# Patient Record
Sex: Male | Born: 1950 | Race: Black or African American | Hispanic: No | Smoking: Former smoker
Health system: Southern US, Community
[De-identification: ages and names within clinical notes are randomized; demographics above are authoritative.]

## PROBLEM LIST (undated history)

## (undated) DIAGNOSIS — H548 Legal blindness, as defined in USA: Secondary | ICD-10-CM

## (undated) DIAGNOSIS — I10 Essential (primary) hypertension: Secondary | ICD-10-CM

## (undated) DIAGNOSIS — C801 Malignant (primary) neoplasm, unspecified: Secondary | ICD-10-CM

## (undated) DIAGNOSIS — E11319 Type 2 diabetes mellitus with unspecified diabetic retinopathy without macular edema: Secondary | ICD-10-CM

## (undated) DIAGNOSIS — E785 Hyperlipidemia, unspecified: Secondary | ICD-10-CM

## (undated) DIAGNOSIS — I509 Heart failure, unspecified: Secondary | ICD-10-CM

## (undated) DIAGNOSIS — J9383 Other pneumothorax: Secondary | ICD-10-CM

## (undated) DIAGNOSIS — D649 Anemia, unspecified: Secondary | ICD-10-CM

## (undated) DIAGNOSIS — N400 Enlarged prostate without lower urinary tract symptoms: Secondary | ICD-10-CM

## (undated) DIAGNOSIS — E119 Type 2 diabetes mellitus without complications: Secondary | ICD-10-CM

## (undated) DIAGNOSIS — N289 Disorder of kidney and ureter, unspecified: Secondary | ICD-10-CM

## (undated) HISTORY — PX: OTHER SURGICAL HISTORY: SHX169

## (undated) HISTORY — PX: CATARACT EXTRACTION: SUR2

## (undated) HISTORY — DX: Type 2 diabetes mellitus with unspecified diabetic retinopathy without macular edema: E11.319

## (undated) HISTORY — DX: Heart failure, unspecified: I50.9

## (undated) HISTORY — PX: EYE SURGERY: SHX253

---

## 2013-03-12 DIAGNOSIS — I10 Essential (primary) hypertension: Secondary | ICD-10-CM | POA: Insufficient documentation

## 2013-03-12 DIAGNOSIS — E119 Type 2 diabetes mellitus without complications: Secondary | ICD-10-CM | POA: Insufficient documentation

## 2013-03-19 DIAGNOSIS — H548 Legal blindness, as defined in USA: Secondary | ICD-10-CM | POA: Insufficient documentation

## 2013-03-19 DIAGNOSIS — H3320 Serous retinal detachment, unspecified eye: Secondary | ICD-10-CM | POA: Insufficient documentation

## 2013-08-30 DIAGNOSIS — N289 Disorder of kidney and ureter, unspecified: Secondary | ICD-10-CM | POA: Insufficient documentation

## 2013-11-08 DIAGNOSIS — C801 Malignant (primary) neoplasm, unspecified: Secondary | ICD-10-CM

## 2013-11-08 HISTORY — DX: Malignant (primary) neoplasm, unspecified: C80.1

## 2014-08-08 DIAGNOSIS — C189 Malignant neoplasm of colon, unspecified: Secondary | ICD-10-CM | POA: Insufficient documentation

## 2014-08-08 DIAGNOSIS — C187 Malignant neoplasm of sigmoid colon: Secondary | ICD-10-CM | POA: Insufficient documentation

## 2014-08-16 DIAGNOSIS — E1121 Type 2 diabetes mellitus with diabetic nephropathy: Secondary | ICD-10-CM | POA: Insufficient documentation

## 2015-05-05 DIAGNOSIS — E611 Iron deficiency: Secondary | ICD-10-CM | POA: Insufficient documentation

## 2015-05-07 ENCOUNTER — Encounter (HOSPITAL_COMMUNITY): Payer: Self-pay | Admitting: Emergency Medicine

## 2015-05-07 ENCOUNTER — Inpatient Hospital Stay (HOSPITAL_BASED_OUTPATIENT_CLINIC_OR_DEPARTMENT_OTHER): Payer: Medicare HMO

## 2015-05-07 ENCOUNTER — Inpatient Hospital Stay (HOSPITAL_COMMUNITY)
Admission: EM | Admit: 2015-05-07 | Discharge: 2015-05-10 | DRG: 871 | Disposition: A | Discharge status: Home / Self Care | Payer: Medicare HMO | Attending: Internal Medicine | Admitting: Internal Medicine

## 2015-05-07 ENCOUNTER — Inpatient Hospital Stay (HOSPITAL_COMMUNITY): Payer: Medicare HMO

## 2015-05-07 ENCOUNTER — Emergency Department (HOSPITAL_COMMUNITY): Payer: Medicare HMO

## 2015-05-07 DIAGNOSIS — Z85038 Personal history of other malignant neoplasm of large intestine: Secondary | ICD-10-CM

## 2015-05-07 DIAGNOSIS — R944 Abnormal results of kidney function studies: Secondary | ICD-10-CM | POA: Diagnosis present

## 2015-05-07 DIAGNOSIS — R0602 Shortness of breath: Secondary | ICD-10-CM

## 2015-05-07 DIAGNOSIS — A419 Sepsis, unspecified organism: Principal | ICD-10-CM | POA: Diagnosis present

## 2015-05-07 DIAGNOSIS — E1139 Type 2 diabetes mellitus with other diabetic ophthalmic complication: Secondary | ICD-10-CM

## 2015-05-07 DIAGNOSIS — E11319 Type 2 diabetes mellitus with unspecified diabetic retinopathy without macular edema: Secondary | ICD-10-CM | POA: Diagnosis present

## 2015-05-07 DIAGNOSIS — N184 Chronic kidney disease, stage 4 (severe): Secondary | ICD-10-CM

## 2015-05-07 DIAGNOSIS — H540 Blindness, both eyes: Secondary | ICD-10-CM | POA: Diagnosis present

## 2015-05-07 DIAGNOSIS — R778 Other specified abnormalities of plasma proteins: Secondary | ICD-10-CM | POA: Insufficient documentation

## 2015-05-07 DIAGNOSIS — E119 Type 2 diabetes mellitus without complications: Secondary | ICD-10-CM

## 2015-05-07 DIAGNOSIS — I5031 Acute diastolic (congestive) heart failure: Secondary | ICD-10-CM | POA: Diagnosis present

## 2015-05-07 DIAGNOSIS — I5033 Acute on chronic diastolic (congestive) heart failure: Secondary | ICD-10-CM | POA: Diagnosis present

## 2015-05-07 DIAGNOSIS — I129 Hypertensive chronic kidney disease with stage 1 through stage 4 chronic kidney disease, or unspecified chronic kidney disease: Secondary | ICD-10-CM | POA: Diagnosis present

## 2015-05-07 DIAGNOSIS — E785 Hyperlipidemia, unspecified: Secondary | ICD-10-CM | POA: Diagnosis present

## 2015-05-07 DIAGNOSIS — J9601 Acute respiratory failure with hypoxia: Secondary | ICD-10-CM | POA: Diagnosis present

## 2015-05-07 DIAGNOSIS — Z87891 Personal history of nicotine dependence: Secondary | ICD-10-CM | POA: Diagnosis not present

## 2015-05-07 DIAGNOSIS — J189 Pneumonia, unspecified organism: Secondary | ICD-10-CM | POA: Diagnosis present

## 2015-05-07 DIAGNOSIS — I272 Other secondary pulmonary hypertension: Secondary | ICD-10-CM | POA: Diagnosis present

## 2015-05-07 DIAGNOSIS — R748 Abnormal levels of other serum enzymes: Secondary | ICD-10-CM | POA: Diagnosis present

## 2015-05-07 DIAGNOSIS — Z809 Family history of malignant neoplasm, unspecified: Secondary | ICD-10-CM | POA: Diagnosis not present

## 2015-05-07 DIAGNOSIS — I10 Essential (primary) hypertension: Secondary | ICD-10-CM | POA: Diagnosis not present

## 2015-05-07 DIAGNOSIS — T17908A Unspecified foreign body in respiratory tract, part unspecified causing other injury, initial encounter: Secondary | ICD-10-CM

## 2015-05-07 DIAGNOSIS — E1122 Type 2 diabetes mellitus with diabetic chronic kidney disease: Secondary | ICD-10-CM | POA: Diagnosis present

## 2015-05-07 DIAGNOSIS — N179 Acute kidney failure, unspecified: Secondary | ICD-10-CM | POA: Diagnosis not present

## 2015-05-07 DIAGNOSIS — Z794 Long term (current) use of insulin: Secondary | ICD-10-CM

## 2015-05-07 DIAGNOSIS — R7989 Other specified abnormal findings of blood chemistry: Secondary | ICD-10-CM | POA: Diagnosis not present

## 2015-05-07 DIAGNOSIS — H548 Legal blindness, as defined in USA: Secondary | ICD-10-CM | POA: Diagnosis present

## 2015-05-07 DIAGNOSIS — Z79899 Other long term (current) drug therapy: Secondary | ICD-10-CM | POA: Diagnosis not present

## 2015-05-07 DIAGNOSIS — Z7982 Long term (current) use of aspirin: Secondary | ICD-10-CM | POA: Diagnosis not present

## 2015-05-07 DIAGNOSIS — Z09 Encounter for follow-up examination after completed treatment for conditions other than malignant neoplasm: Secondary | ICD-10-CM

## 2015-05-07 DIAGNOSIS — N400 Enlarged prostate without lower urinary tract symptoms: Secondary | ICD-10-CM | POA: Diagnosis present

## 2015-05-07 DIAGNOSIS — I071 Rheumatic tricuspid insufficiency: Secondary | ICD-10-CM | POA: Diagnosis present

## 2015-05-07 HISTORY — DX: Benign prostatic hyperplasia without lower urinary tract symptoms: N40.0

## 2015-05-07 HISTORY — DX: Legal blindness, as defined in USA: H54.8

## 2015-05-07 HISTORY — DX: Essential (primary) hypertension: I10

## 2015-05-07 HISTORY — DX: Malignant (primary) neoplasm, unspecified: C80.1

## 2015-05-07 HISTORY — DX: Disorder of kidney and ureter, unspecified: N28.9

## 2015-05-07 HISTORY — DX: Type 2 diabetes mellitus without complications: E11.9

## 2015-05-07 HISTORY — DX: Hyperlipidemia, unspecified: E78.5

## 2015-05-07 HISTORY — DX: Other pneumothorax: J93.83

## 2015-05-07 LAB — CBC
HEMATOCRIT: 31.4 % — AB (ref 39.0–52.0)
Hemoglobin: 9.9 g/dL — ABNORMAL LOW (ref 13.0–17.0)
MCH: 30 pg (ref 26.0–34.0)
MCHC: 31.5 g/dL (ref 30.0–36.0)
MCV: 95.2 fL (ref 78.0–100.0)
Platelets: 232 10*3/uL (ref 150–400)
RBC: 3.3 MIL/uL — AB (ref 4.22–5.81)
RDW: 13.7 % (ref 11.5–15.5)
WBC: 12.2 10*3/uL — ABNORMAL HIGH (ref 4.0–10.5)

## 2015-05-07 LAB — BLOOD GAS, ARTERIAL
Acid-base deficit: 7.8 mmol/L — ABNORMAL HIGH (ref 0.0–2.0)
Bicarbonate: 17.8 mEq/L — ABNORMAL LOW (ref 20.0–24.0)
Drawn by: 31814
FIO2: 1 %
O2 Saturation: 72.9 %
Patient temperature: 97.8
TCO2: 16.8 mmol/L (ref 0–100)
pCO2 arterial: 37.9 mmHg (ref 35.0–45.0)
pH, Arterial: 7.29 — ABNORMAL LOW (ref 7.350–7.450)
pO2, Arterial: 46 mmHg — ABNORMAL LOW (ref 80.0–100.0)

## 2015-05-07 LAB — HEPARIN LEVEL (UNFRACTIONATED): Heparin Unfractionated: 0.55 IU/mL (ref 0.30–0.70)

## 2015-05-07 LAB — GLUCOSE, CAPILLARY
GLUCOSE-CAPILLARY: 118 mg/dL — AB (ref 65–99)
Glucose-Capillary: 142 mg/dL — ABNORMAL HIGH (ref 65–99)
Glucose-Capillary: 117 mg/dL — ABNORMAL HIGH (ref 65–99)

## 2015-05-07 LAB — BASIC METABOLIC PANEL
ANION GAP: 8 (ref 5–15)
BUN: 39 mg/dL — AB (ref 6–20)
CHLORIDE: 107 mmol/L (ref 101–111)
CO2: 20 mmol/L — ABNORMAL LOW (ref 22–32)
Calcium: 8.1 mg/dL — ABNORMAL LOW (ref 8.9–10.3)
Creatinine, Ser: 2.93 mg/dL — ABNORMAL HIGH (ref 0.61–1.24)
GFR calc non Af Amer: 21 mL/min — ABNORMAL LOW (ref >60)
GFR, EST AFRICAN AMERICAN: 24 mL/min — AB (ref >60)
GLUCOSE: 164 mg/dL — AB (ref 65–99)
Potassium: 4.4 mmol/L (ref 3.5–5.1)
Sodium: 135 mmol/L (ref 135–145)

## 2015-05-07 LAB — I-STAT CG4 LACTIC ACID, ED: LACTIC ACID, VENOUS: 1.25 mmol/L (ref 0.5–2.0)

## 2015-05-07 LAB — I-STAT TROPONIN, ED: Troponin i, poc: 0.18 ng/mL (ref 0.00–0.08)

## 2015-05-07 LAB — HIV ANTIBODY (ROUTINE TESTING W REFLEX): HIV Screen 4th Generation wRfx: NONREACTIVE

## 2015-05-07 LAB — TROPONIN I
TROPONIN I: 0.34 ng/mL — AB (ref <0.031)
Troponin I: 0.86 ng/mL (ref <0.031)
Troponin I: 0.87 ng/mL (ref <0.031)

## 2015-05-07 LAB — BRAIN NATRIURETIC PEPTIDE: B Natriuretic Peptide: 303.6 pg/mL — ABNORMAL HIGH (ref 0.0–100.0)

## 2015-05-07 LAB — STREP PNEUMONIAE URINARY ANTIGEN: Strep Pneumo Urinary Antigen: NEGATIVE

## 2015-05-07 MED ORDER — POLYSACCHARIDE IRON COMPLEX 150 MG PO CAPS
150.0000 mg | ORAL_CAPSULE | Freq: Every day | ORAL | Status: DC
  Administered 2015-05-07 – 2015-05-10 (×3): 150 mg via ORAL
  Filled 2015-05-07 (×4): qty 1

## 2015-05-07 MED ORDER — TAMSULOSIN HCL 0.4 MG PO CAPS
0.8000 mg | ORAL_CAPSULE | Freq: Every evening | ORAL | Status: DC
  Administered 2015-05-07 – 2015-05-09 (×3): 0.8 mg via ORAL
  Filled 2015-05-07 (×4): qty 2

## 2015-05-07 MED ORDER — GLIPIZIDE ER 10 MG PO TB24
10.0000 mg | ORAL_TABLET | Freq: Every day | ORAL | Status: DC
  Administered 2015-05-07 – 2015-05-10 (×3): 10 mg via ORAL
  Filled 2015-05-07 (×4): qty 1

## 2015-05-07 MED ORDER — PERFLUTREN LIPID MICROSPHERE
INTRAVENOUS | Status: AC
  Filled 2015-05-07: qty 10

## 2015-05-07 MED ORDER — AMLODIPINE BESYLATE 10 MG PO TABS
10.0000 mg | ORAL_TABLET | Freq: Every day | ORAL | Status: DC
  Administered 2015-05-07 – 2015-05-10 (×3): 10 mg via ORAL
  Filled 2015-05-07 (×3): qty 1

## 2015-05-07 MED ORDER — DEXTROSE 5 % IV SOLN
1.0000 g | Freq: Once | INTRAVENOUS | Status: AC
  Administered 2015-05-07: 1 g via INTRAVENOUS
  Filled 2015-05-07: qty 10

## 2015-05-07 MED ORDER — ALBUTEROL SULFATE (2.5 MG/3ML) 0.083% IN NEBU
2.5000 mg | INHALATION_SOLUTION | Freq: Four times a day (QID) | RESPIRATORY_TRACT | Status: DC | PRN
  Administered 2015-05-07: 2.5 mg via RESPIRATORY_TRACT
  Filled 2015-05-07: qty 3

## 2015-05-07 MED ORDER — PIPERACILLIN-TAZOBACTAM 3.375 G IVPB
3.3750 g | Freq: Three times a day (TID) | INTRAVENOUS | Status: DC
  Administered 2015-05-07 – 2015-05-09 (×5): 3.375 g via INTRAVENOUS
  Filled 2015-05-07 (×5): qty 50

## 2015-05-07 MED ORDER — VANCOMYCIN HCL IN DEXTROSE 1-5 GM/200ML-% IV SOLN
1000.0000 mg | INTRAVENOUS | Status: DC
  Administered 2015-05-07 – 2015-05-08 (×2): 1000 mg via INTRAVENOUS
  Filled 2015-05-07 (×2): qty 200

## 2015-05-07 MED ORDER — INSULIN GLARGINE 100 UNIT/ML ~~LOC~~ SOLN
8.0000 [IU] | Freq: Every day | SUBCUTANEOUS | Status: DC
  Administered 2015-05-08: 8 [IU] via SUBCUTANEOUS
  Filled 2015-05-07 (×2): qty 0.08

## 2015-05-07 MED ORDER — IPRATROPIUM-ALBUTEROL 0.5-2.5 (3) MG/3ML IN SOLN
3.0000 mL | Freq: Once | RESPIRATORY_TRACT | Status: AC
  Administered 2015-05-07: 3 mL via RESPIRATORY_TRACT
  Filled 2015-05-07: qty 3

## 2015-05-07 MED ORDER — ASPIRIN EC 81 MG PO TBEC
81.0000 mg | DELAYED_RELEASE_TABLET | Freq: Every day | ORAL | Status: DC
  Administered 2015-05-09 – 2015-05-10 (×2): 81 mg via ORAL
  Filled 2015-05-07 (×4): qty 1

## 2015-05-07 MED ORDER — DEXTROSE 5 % IV SOLN: 1.0000 g | INTRAVENOUS | Status: DC

## 2015-05-07 MED ORDER — METOPROLOL TARTRATE 25 MG PO TABS
25.0000 mg | ORAL_TABLET | Freq: Two times a day (BID) | ORAL | Status: DC
  Administered 2015-05-07 – 2015-05-10 (×6): 25 mg via ORAL
  Filled 2015-05-07 (×6): qty 1

## 2015-05-07 MED ORDER — DEXTROSE 5 % IV SOLN
500.0000 mg | Freq: Once | INTRAVENOUS | Status: AC
  Administered 2015-05-07: 500 mg via INTRAVENOUS
  Filled 2015-05-07 (×2): qty 500

## 2015-05-07 MED ORDER — PRAVASTATIN SODIUM 40 MG PO TABS
40.0000 mg | ORAL_TABLET | Freq: Every evening | ORAL | Status: DC
  Administered 2015-05-07 – 2015-05-09 (×3): 40 mg via ORAL
  Filled 2015-05-07 (×3): qty 1
  Filled 2015-05-07: qty 2

## 2015-05-07 MED ORDER — HEPARIN BOLUS VIA INFUSION
3000.0000 [IU] | Freq: Once | INTRAVENOUS | Status: AC
  Administered 2015-05-07: 3000 [IU] via INTRAVENOUS
  Filled 2015-05-07: qty 3000

## 2015-05-07 MED ORDER — HEPARIN SODIUM (PORCINE) 5000 UNIT/ML IJ SOLN
5000.0000 [IU] | Freq: Three times a day (TID) | INTRAMUSCULAR | Status: DC
  Administered 2015-05-07: 5000 [IU] via SUBCUTANEOUS
  Filled 2015-05-07: qty 1

## 2015-05-07 MED ORDER — LINAGLIPTIN 5 MG PO TABS
5.0000 mg | ORAL_TABLET | Freq: Every day | ORAL | Status: DC
  Administered 2015-05-07 – 2015-05-10 (×3): 5 mg via ORAL
  Filled 2015-05-07 (×5): qty 1

## 2015-05-07 MED ORDER — LEVOFLOXACIN IN D5W 750 MG/150ML IV SOLN
750.0000 mg | INTRAVENOUS | Status: DC
Start: 2015-05-08 — End: 2015-05-09
  Administered 2015-05-08: 750 mg via INTRAVENOUS
  Filled 2015-05-07: qty 150

## 2015-05-07 MED ORDER — PERFLUTREN LIPID MICROSPHERE
1.0000 mL | INTRAVENOUS | Status: AC | PRN
  Administered 2015-05-07: 2 mL via INTRAVENOUS
  Filled 2015-05-07: qty 10

## 2015-05-07 MED ORDER — HEPARIN (PORCINE) IN NACL 100-0.45 UNIT/ML-% IJ SOLN
1100.0000 [IU]/h | INTRAMUSCULAR | Status: DC
  Administered 2015-05-07: 1100 [IU]/h via INTRAVENOUS
  Filled 2015-05-07 (×2): qty 250

## 2015-05-07 MED ORDER — AZITHROMYCIN 250 MG PO TABS: 500.0000 mg | ORAL_TABLET | ORAL | Status: DC

## 2015-05-07 MED ORDER — LOSARTAN POTASSIUM 50 MG PO TABS
100.0000 mg | ORAL_TABLET | Freq: Every day | ORAL | Status: DC
  Administered 2015-05-07 – 2015-05-10 (×3): 100 mg via ORAL
  Filled 2015-05-07 (×4): qty 2

## 2015-05-07 MED ORDER — SODIUM CHLORIDE 0.9 % IV SOLN
INTRAVENOUS | Status: DC
  Administered 2015-05-07: 06:00:00 via INTRAVENOUS

## 2015-05-07 MED ORDER — DEXTROSE 5 % IV SOLN
2.0000 g | INTRAVENOUS | Status: DC
  Administered 2015-05-07: 2 g via INTRAVENOUS
  Filled 2015-05-07: qty 2

## 2015-05-07 MED ORDER — ASPIRIN 325 MG PO TABS
325.0000 mg | ORAL_TABLET | Freq: Once | ORAL | Status: AC
  Administered 2015-05-07: 325 mg via ORAL
  Filled 2015-05-07: qty 1

## 2015-05-07 NOTE — Progress Notes (Signed)
Sift event note:  Notified by RN at approx 2000 regarding pt's increased WOB and hypoxia. Pt reportedly had a choking episode during his dinner tonight (at approx 1900). Approx 10 minutes after this choking episode pt began to have increased WOB. RN's entered room during bedside report and noted pt w/ 02 sats in the low 70's on 4L [place]. His 02 was increased which improved his 02 sat minimally. RR RN was paged and responded to bedside. Pt was placed on a NRB at 100% and 02 sats improved to mid 90's but pt remained tachypneic, A stat PCXR and ABG was ordered. At bedside pt noted very tachypeic w/ RR of 35-42 (though RR RN admits his WOB improved somewhat after placing him on NRB). Remaining VSS and pt is afebrile. BBS coarse w/ faint exp wheezes R>L. Pt denies CP. After some period of observation pt remains somewhat  tachypenic but 02 sats 100% on NRB at 100%. Attempted to titrate 02 down slightly but pt immediately more tachypeic w/ drop in 02 sats. PCXR reveals worsening bilateral airspace opacities compared to CXR from earlier this am. Findings could reflect progressive aspiration, PNA,, vs pulmonary edema. (Previous CXR also suggestive of borderline cardiomegaly and mild vascular congestion). ABG reveals ph-7.29, pC02-37.9, p02-46 and bicarb of 17.8. RRT suspects mixed venous sample. Pt was transferred to SDU where he was placed on BIPAP.  Spoke briefly w/ pt's wife and sister who relate a similar episode while eating Tuesday night that resulted in a change from his recent dry cough to a harsh more productive cough and increasing SOB. It was when he attempted to lie down for bed Tuesday night that he became so SOB he ultimately decided to come into ED for evaluation.  Assessment/Plan: 1. Acute hypoxemic respiratory failure: In setting of pt w/ worsening bilateral pneumonia vs aspiration vs ARDS vs pulmonary edema.  Will broaden coverage for aspiration and atypical's. Will give Lasix 40 mg IV x 1.  Will continue  BiPAP and repeat ABG in 2-3 hours. If ABG WNL's will repeat CXR in am. If abnormal will repeat CXR tonight. Low threshold to consult CCM if pt continues to deteriorate. Will continue to monitor closely in SDU.   Jeryl Columbia, NP-C [facility] Pager [phone]

## 2015-05-07 NOTE — ED Notes (Signed)
Pt transported to XRAY °

## 2015-05-07 NOTE — ED Notes (Signed)
Bed: FJ01 Expected date: 05/07/15 Expected time: 12:49 AM Means of arrival: Ambulance Comments: Respiratory distress

## 2015-05-07 NOTE — Progress Notes (Signed)
Echocardiogram 2D Echocardiogram has been performed.  Troy Young 05/07/2015, 4:19 PM

## 2015-05-07 NOTE — Progress Notes (Signed)
   [facility]                                                                              Patient Demographics  Troy Young, is a 64 y.o. male, DOB - [birth year], FUX:[id number]  Admit date - 05/07/2015   Admitting Physician Etta Quill, DO  Outpatient Primary MD for the patient is No PCP Per Patient  LOS - 0   Chief Complaint  Patient presents with  . Shortness of Breath      HPI on 05/07/2015 by Dr. Jennette Kettle Troy Young is a 64 y.o. 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 Patient presents to the ED with c/o 3 day history of cough productive of this sputum, SOB worse with any exertion. His SOB got so bad that he finally called EMS this morning and was noted to be satting 83% on room air. Albuterol neb given en route provided some relief.  He has no chest pain at all, has chills and subjective fever. No recent hospital stays within the past 3 months.  Assessment & Plan   Patient admitted earlier this morning by Dr. Jennette Kettle.  Please see full H&P for details. Agree with current assessment and plan.  Sepsis secondary to community-acquired pneumonia -Patient was tachycardic, tachypneic with leukocytosis upon admission -Chest x-ray: Patchy bilateral lower lobe opacities, may reflect pneumonia, follow-up PA and lateral x-ray recommended in 3-4 weeks -Continue azithromycin and Rocephin -Blood cultures pending, sputum cultures pending -Urine strep pneumonia and legionella antigens pending -Continue neb treatments as needed  Acute respiratory failure secondary to pneumonia -Upon arrival to the ER, oxygen saturations were 83% -Continue current treatment plan as stated above -Continue supplemental oxygen to maintain saturations above 92%  Elevated troponin -Troponin 0.34 -No complaints of active chest pain -Possibly secondary to respiratory illness in the  setting of CAD, stage IV -Cardiology consulted and appreciated -Echocardiogram pending  Hypertension -Continue amlodipine, losartan, metoprolol  Diabetes mellitus, type II -Continue glipizide, tradjenta, lantus  Hyperlipidemia -Continue statin  Elevated BNP -Patient does have chronic kidney disease, stage IV -Echocardiogram pending  Chronic kidney disease, Stage IV -Patient follows with nephrologist at [place] to monitor BMP  Code Status: Full  Family Communication: Not at bedside  Disposition Plan: Admitted, continue to monitor  Time Spent in minutes   30 minutes  Procedures  None  Consults   Cardiology  DVT Prophylaxis  heparin  Troy Young D.O. on 05/07/2015 at 11:54 AM  Between 7am to 7pm - Pager - [phone]  After 7pm go to [email or web address] - password [email or web address]  And look for the night coverage person covering for me after hours  [facility] Office  [phone]

## 2015-05-07 NOTE — Progress Notes (Signed)
MD aware of troponin level

## 2015-05-07 NOTE — H&P (Signed)
[facility] History and Physical  Idrees Quam ZYS:[id number] DOB: 1951/10/18 DOA: 05/07/2015  Referring physician: EDP PCP: No PCP Per Patient   Chief Complaint: SOB   HPI: Rio Kidane is a 64 y.o. 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  Patient presents to the ED with c/o 3 day history of cough productive of this sputum, SOB worse with any exertion.  His SOB got so bad that he finally called EMS this morning and was noted to be satting 83% on room air.  Albuterol neb given en route provided some relief.  He has no chest pain at all, has chills and subjective fever.  No recent hospital stays within the past 3 months.  Review of Systems: Systems reviewed.  As above, otherwise negative  Past Medical History  Diagnosis Date  . Legally blind   . DM2 (diabetes mellitus, type 2)   . Hypertension   . Hyperlipidemia   . BPH (benign prostatic hyperplasia)   . Cancer     colon cancer (malignant polyp) removed  . Renal disorder   . Renal insufficiency    Past Surgical History  Procedure Laterality Date  . Polyp removed     Social History:  reports that he has quit smoking. He does not have any smokeless tobacco history on file. He reports that he does not drink alcohol. His drug history is not on file.  No Known Allergies  No family history on file.   Prior to Admission medications   Medication Sig Start Date End Date Taking? Authorizing Provider  amLODipine (NORVASC) 10 MG tablet Take 10 mg by mouth daily.   Yes Historical Provider, MD  aspirin EC 81 MG tablet Take 81 mg by mouth daily.   Yes Historical Provider, MD  glipiZIDE (GLUCOTROL XL) 10 MG 24 hr tablet Take 10 mg by mouth daily with breakfast.   Yes Historical Provider, MD  insulin glargine (LANTUS) 100 UNIT/ML injection Inject 8 Units into the skin at bedtime.   Yes Historical Provider, MD  iron polysaccharides  (NIFEREX) 150 MG capsule Take 150 mg by mouth daily. 05/05/15  Yes Historical Provider, MD  losartan (COZAAR) 100 MG tablet Take 100 mg by mouth daily.   Yes Historical Provider, MD  metoprolol tartrate (LOPRESSOR) 25 MG tablet Take 25 mg by mouth 2 (two) times daily.   Yes Historical Provider, MD  pravastatin (PRAVACHOL) 40 MG tablet Take 40 mg by mouth every evening.   Yes Historical Provider, MD  sitaGLIPtin (JANUVIA) 50 MG tablet Take 50 mg by mouth daily.   Yes Historical Provider, MD  tamsulosin (FLOMAX) 0.4 MG CAPS capsule Take 0.8 mg by mouth every evening.   Yes Historical Provider, MD   Physical Exam: Filed Vitals:   05/07/15 0316  BP:   Pulse:   Temp: 98.6 F (37 C)  Resp:     BP 116/61 mmHg  Pulse 92  Temp(Src) 98.6 F (37 C) (Oral)  Resp 23  SpO2 91%  General Appearance:    Alert, oriented, no distress, appears stated age  Head:    Normocephalic, atraumatic  Eyes:    PERRL, EOMI, sclera non-icteric        Nose:   Nares without drainage or epistaxis. Mucosa, turbinates normal  Throat:   Moist mucous membranes. Oropharynx without erythema or exudate.  Neck:   Supple. No carotid bruits.  No thyromegaly.  No lymphadenopathy.  Back:     No CVA tenderness, no spinal tenderness  Lungs:     Bibasilar rhonchi  Chest wall:    No tenderness to palpitation  Heart:    Regular rate and rhythm without murmurs, gallops, rubs  Abdomen:     Soft, non-tender, nondistended, normal bowel sounds, no organomegaly  Genitalia:    deferred  Rectal:    deferred  Extremities:   No clubbing, cyanosis or edema.  Pulses:   2+ and symmetric all extremities  Skin:   Skin color, texture, turgor normal, no rashes or lesions  Lymph nodes:   Cervical, supraclavicular, and axillary nodes normal  Neurologic:   CNII-XII intact. Normal strength, sensation and reflexes      throughout    Labs on Admission:  Basic Metabolic Panel:  Recent Labs Lab 05/07/15 0212  NA 135  K 4.4  CL 107  CO2  20*  GLUCOSE 164*  BUN 39*  CREATININE 2.93*  CALCIUM 8.1*   Liver Function Tests: No results for input(s): AST, ALT, ALKPHOS, BILITOT, PROT, ALBUMIN in the last 168 hours. No results for input(s): LIPASE, AMYLASE in the last 168 hours. No results for input(s): AMMONIA in the last 168 hours. CBC:  Recent Labs Lab 05/07/15 0212  WBC 12.2*  HGB 9.9*  HCT 31.4*  MCV 95.2  PLT 232   Cardiac Enzymes: No results for input(s): CKTOTAL, CKMB, CKMBINDEX, TROPONINI in the last 168 hours.  BNP (last 3 results) No results for input(s): PROBNP in the last 8760 hours. CBG: No results for input(s): GLUCAP in the last 168 hours.  Radiological Exams on Admission: Dg Chest 2 View (if Patient Has Fever And/or Copd)  05/07/2015   CLINICAL DATA:  Cough and increasing shortness of breath for 3 days. Chest discomfort.  EXAM: CHEST  2 VIEW  COMPARISON:  None.  FINDINGS: Bilateral patchy lower lobe consolidations. Heart is at the upper limits normal in size. There is vascular congestion, no frank pulmonary edema. No pleural effusion or pneumothorax. No acute osseous abnormalities are seen.  IMPRESSION: 1. Patchy bilateral lower lobe opacities. This may reflect pneumonia, however aspiration is considered given distribution. Followup PA and lateral chest X-ray is recommended in 3-4 weeks following trial of antibiotic therapy to ensure resolution and exclude underlying malignancy. 2. Borderline cardiomegaly.  Mild vascular congestion.   Electronically Signed   By: Jeb Levering M.D.   On: 05/07/2015 03:05    EKG: Independently reviewed.  Assessment/Plan Principal Problem:   CAP (community acquired pneumonia) Active Problems:   DM2 (diabetes mellitus, type 2)   HTN (hypertension)   CKD stage 4 due to type 2 diabetes mellitus   1. CAP - CXR c/w BLL infiltrate 1. PNA pathway 2. Rocephin and azithromycin for CAP treatment 3. Cultures pending 4. Continuous pulse ox 5. Neb treatments PRN 2. Mild  POC troponin elevation - no chest pain at all, will get serial troponins and put on tele monitor for now. 3. HTN - continue home meds 4. DM2 - continue home meds, CBG checks AC/HS    Code Status: Full  Family Communication: Wife at bedside Disposition Plan: Admit to inpatient   Time spent: 36 min  Shawndale Kilpatrick M. [facility] Pager [phone]  If 7AM-7PM, please contact the day team taking care of the patient [email or web address] Password [facility] 05/07/2015, 4:25 AM

## 2015-05-07 NOTE — Progress Notes (Signed)
On giving report in room patient very SOB  o2 sats noted 70-80's coughing after eating per niece.placed on venti mask And RT called to assist.mid level paged.awaiting call back

## 2015-05-07 NOTE — Progress Notes (Signed)
Due to coughing, SOB and O2 Sats in the low 70's, respiratory therapy, rapid response and MD contacted. Patient sats picked back up to normal range, but had to work hard. Patient transferred to ICU for further monitoring.

## 2015-05-07 NOTE — Consult Note (Signed)
CARDIOLOGY CONSULT NOTE  Patient ID: Troy Young MRN: [id number] DOB/AGE: [birth year] 64 y.o.  Admit date: 05/07/2015 Primary Physician Dr. Queen Slough [facility]) Primary Cardiologist   None Chief Complaint  Cough and dyspnea  HPI:  The patient has no past cardiac history.  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   He presented with progressive cough with sputum production and SOB.  EMS was called and he was hypoxemic on RA.  He was treated with albuterol.  In the ED his CXR demonstrated possible pneumonia. Troponin was elevated with a peak thus far of 0.86.  BNP increased at 303.  He is admitted and being treated with antibiotics and bronchodilators.  We are called with the increased troponin.    The patient denies any chest pain, neck or arm pain.  He walks with a cane and works at [employer] for [employer].  With this level of activity he denies any cardiovascular symptoms.  The patient denies any new symptoms such as chest discomfort, neck or arm discomfort. There has been no new shortness of breath, PND or orthopnea. There have been no reported palpitations, presyncope or syncope.  He reports that his current cough and SOB is unusual.    Past Medical History  Diagnosis Date  . Legally blind   . DM2 (diabetes mellitus, type 2)   . Hypertension   . Hyperlipidemia   . BPH (benign prostatic hyperplasia)   . Cancer     colon cancer (malignant polyp) removed  . Spontaneous pneumothorax     2  . Renal insufficiency     Past Surgical History  Procedure Laterality Date  . Polyp removed      No Known Allergies Prescriptions prior to admission  Medication Sig Dispense Refill Last Dose  . amLODipine (NORVASC) 10 MG tablet Take 10 mg by mouth daily.   05/06/2015 at Unknown time  . aspirin EC 81 MG tablet Take 81 mg by mouth daily.   05/06/2015 at Unknown time  . glipiZIDE (GLUCOTROL XL) 10 MG 24 hr tablet Take 10 mg by mouth daily with breakfast.   05/06/2015 at Unknown  time  . insulin glargine (LANTUS) 100 UNIT/ML injection Inject 8 Units into the skin at bedtime.   05/06/2015 at Unknown time  . iron polysaccharides (NIFEREX) 150 MG capsule Take 150 mg by mouth daily.   05/06/2015 at Unknown time  . losartan (COZAAR) 100 MG tablet Take 100 mg by mouth daily.   05/06/2015 at Unknown time  . metoprolol tartrate (LOPRESSOR) 25 MG tablet Take 25 mg by mouth 2 (two) times daily.   05/06/2015 at 2130  . pravastatin (PRAVACHOL) 40 MG tablet Take 40 mg by mouth every evening.   05/06/2015 at Unknown time  . sitaGLIPtin (JANUVIA) 50 MG tablet Take 50 mg by mouth daily.   05/06/2015 at Unknown time  . tamsulosin (FLOMAX) 0.4 MG CAPS capsule Take 0.8 mg by mouth every evening.   05/06/2015 at Unknown time   Family History  Problem Relation Age of Onset  . Cancer Father     No history of early CAD.    History   Social History  . Marital Status: Divorced    Spouse Name: N/A  . Number of Children: 2  . Years of Education: N/A   Occupational History  . Not on file.   Social History Main Topics  . Smoking status: Former Smoker -- 1.00 packs/day for 20 years    Types: Cigarettes  .  Smokeless tobacco: Not on file     Comment: Quit early 44s  . Alcohol Use: No  . Drug Use: Not on file  . Sexual Activity: Not on file   Other Topics Concern  . Not on file   Social History Narrative   Lives with sister and niece.  Works at [employer].       ROS:  Blind.  As stated in the HPI and negative for all other systems.  Physical Exam: Blood pressure 125/61, pulse 86, temperature 97.8 F (36.6 C), temperature source Oral, resp. rate 20, height 5\' 8"  (1.727 m), weight 182 lb 12.2 oz (82.9 kg), SpO2 99 %.  GENERAL:  Well appearing,  HEENT: Lens opacification,  fundi not visualized, oral mucosa unremarkable, dentures.  NECK:  No jugular venous distention, waveform within normal limits, carotid upstroke brisk and symmetric, no bruits, no thyromegaly LYMPHATICS:   No cervical, inguinal adenopathy LUNGS:  Clear to auscultation bilaterally BACK:  No CVA tenderness CHEST:  Unremarkable HEART:  PMI not displaced or sustained,S1 and S2 within normal limits, no S3, no S4, no clicks, no rubs, no murmurs ABD:  Flat, positive bowel sounds normal in frequency in pitch, no bruits, no rebound, no guarding, no midline pulsatile mass, no hepatomegaly, no splenomegaly EXT:  2 plus pulses throughout, no edema, no cyanosis no clubbing SKIN:  No rashes no nodules NEURO:  Cranial nerves II through XII grossly intact, motor grossly intact throughout PSYCH:  Cognitively intact, oriented to person place and time   Labs: Lab Results  Component Value Date   BUN 39* 05/07/2015   Lab Results  Component Value Date   CREATININE 2.93* 05/07/2015   Lab Results  Component Value Date   NA 135 05/07/2015   K 4.4 05/07/2015   CL 107 05/07/2015   CO2 20* 05/07/2015   Lab Results  Component Value Date   TROPONINI 0.86* 05/07/2015   Lab Results  Component Value Date   WBC 12.2* 05/07/2015   HGB 9.9* 05/07/2015   HCT 31.4* 05/07/2015   MCV 95.2 05/07/2015   PLT 232 05/07/2015      Radiology:   CXR: 1. Patchy bilateral lower lobe opacities. This may reflect pneumonia, however aspiration is considered given distribution. Followup PA and lateral chest X-ray is recommended in 3-4 weeks following trial of antibiotic therapy to ensure resolution and exclude underlying malignancy. 2. Borderline cardiomegaly. Mild vascular congestion.  EKG: NSR, rate 100, poor anterior R wave progression, no acute ST T wave changes.  05/07/2015  ASSESSMENT AND PLAN:   ELEVATED TROPONIN:  The patient has no symptoms consistent with ACS.  He does however, have significant risk factors.  Given this he will need an ischemia work up as an out patient.  We can arrange for [facility] after he has recovered from his pneumonia if the EF is OK.    HTN: The blood pressure is at target.  No change in medications is indicated. We will continue with therapeutic lifestyle changes (TLC).  DM: Per primary team,     Signed: Minus Breeding 05/07/2015, 1:58 PM

## 2015-05-07 NOTE — ED Notes (Addendum)
Pt from home via [facility] c/o shortness of breath x few days, cough x 3 days. Shortness of breath worsens upon exertion. Per [facility] pt room air sat was 83%. 5 mg Albuteral treatment given en route with minimal relief. 22G left hand placed by [facility]. Pt denies pain.

## 2015-05-07 NOTE — ED Notes (Signed)
MD at bedside. 

## 2015-05-07 NOTE — Progress Notes (Signed)
CRITICAL VALUE ALERT  Critical value received:  troponin +  Date of notification: 05/07/15  Time of notification: 1212  Critical value read back:Yes.    Nurse who received alert: April Goldye Tourangeau  MD notified (1st page): Dr Ree Kida  Time of first page: 1213  MD notified (2nd page):  Time of second page:  Responding MD:  Ree Kida  Time MD responded:  1213

## 2015-05-07 NOTE — Care Management Note (Signed)
Case Management Note  Patient Details  Name: Troy Young MRN: [id number] Date of Birth: [birth year]  Subjective/Objective:    Pt admitted with CAP                Action/Plan: From home plan to return home   Expected Discharge Date:                  Expected Discharge Plan:  Home/Self Care  In-House Referral:     Discharge planning Services  CM Consult  Post Acute Care Choice:    Choice offered to:     DME Arranged:    DME Agency:     HH Arranged:    HH Agency:     Status of Service:  In process, will continue to follow  Medicare Important Message Given:    Date Medicare IM Given:    Medicare IM give by:    Date Additional Medicare IM Given:    Additional Medicare Important Message give by:     If discussed at [place] of Stay Meetings, dates discussed:    Additional CommentsPurcell Mouton, RN 05/07/2015, 3:29 PM

## 2015-05-07 NOTE — ED Notes (Signed)
Dr. Alcario Drought at pt bedside.

## 2015-05-07 NOTE — Progress Notes (Addendum)
Pt assessed per rx for adult wheeze protocol.  No sob/respiratory distress or wheezes noted.  A nebulizer treatment was not indicated or done at this time.  RT protocol assessment also done and albuterol nebs ordered q6prn for wheeze or sob.  RT will monitor and assess as needed.

## 2015-05-07 NOTE — Progress Notes (Signed)
ANTICOAGULATION CONSULT NOTE - Initial Consult  Pharmacy Consult for heparin Indication: chest pain/ACS  No Known Allergies  Patient Measurements: Height: 5\' 8"  (172.7 cm) Weight: 182 lb 12.2 oz (82.9 kg) IBW/kg (Calculated) : 68.4 Heparin Dosing Weight: 83kg  Vital Signs: Temp: 97.8 F (36.6 C) (06/29 0534) Temp Source: Oral (06/29 0534) BP: 125/61 mmHg (06/29 0534) Pulse Rate: 86 (06/29 0534)  Labs:  Recent Labs  05/07/15 0212 05/07/15 0430 05/07/15 1015  HGB 9.9*  --   --   HCT 31.4*  --   --   PLT 232  --   --   CREATININE 2.93*  --   --   TROPONINI  --  0.34* 0.86*    Estimated Creatinine Clearance: 26.4 mL/min (by C-G formula based on Cr of 2.93).   Medical History: Past Medical History  Diagnosis Date  . Legally blind   . DM2 (diabetes mellitus, type 2)   . Hypertension   . Hyperlipidemia   . BPH (benign prostatic hyperplasia)   . Cancer     colon cancer (malignant polyp) removed  . Renal disorder   . Renal insufficiency     Assessment: 1 YOM presented to ED with SOB, troponin trending up.  Patient denies chest pain. Pharmacy asked to dose heparin gtt. No know previous history of CAD  Today, 05/07/2015  CBC: Hgb = 9.9, pltc WNL  On SQ heparin, last dose 6am  Renal: stage IV CKD, CrCl < 24ml/min  Goal of Therapy:  Heparin level 0.3-0.7 units/ml Monitor platelets by anticoagulation protocol: Yes   Plan:   Give 3000 units bolus x 1  Start heparin infusion at 1100 units/hr  Check anti-Xa level in 8 hours and daily while on heparin  Daily heparin level and CBC  Continue to monitor H&H and platelets  Doreene Eland, PharmD, BCPS.   Pager: [phone] 05/07/2015,1:45 PM

## 2015-05-07 NOTE — Progress Notes (Signed)
RRT called

## 2015-05-07 NOTE — ED Provider Notes (Signed)
CSN: [id number]     Arrival date & time 05/07/15  0108 History   First MD Initiated Contact with Patient 05/07/15 0214     Chief Complaint  Patient presents with  . Shortness of Breath     (Consider location/radiation/quality/duration/timing/severity/associated sxs/prior Treatment) HPI Patient presents with 3 days of cough productive of thick sputum. He's also had shortness of breath worsened with any exertion. EMS was called and patient was hypoxic to 83% on room air. He was given albuterol neb in route with some relief. Patient does admit to episodic wheezing. He states his breathing is improved after the treatment. He's had no lower extremity swelling or pain. He denies any chest pain. Admits to chills and subjective fever. No recent hospitalizations. Past Medical History  Diagnosis Date  . Legally blind   . DM2 (diabetes mellitus, type 2)   . Hypertension   . Hyperlipidemia   . BPH (benign prostatic hyperplasia)   . Cancer     colon cancer (malignant polyp) removed  . Spontaneous pneumothorax     2  . Renal insufficiency    Past Surgical History  Procedure Laterality Date  . Polyp removed     Family History  Problem Relation Age of Onset  . Cancer Father    History  Substance Use Topics  . Smoking status: Former Smoker -- 1.00 packs/day for 20 years    Types: Cigarettes  . Smokeless tobacco: Not on file     Comment: Quit early 54s  . Alcohol Use: No    Review of Systems  Constitutional: Positive for fever, chills and fatigue.  Respiratory: Positive for cough, shortness of breath and wheezing.   Cardiovascular: Negative for chest pain and leg swelling.  Gastrointestinal: Negative for nausea, vomiting, abdominal pain, diarrhea and constipation.  Genitourinary: Negative for dysuria, frequency, hematuria and flank pain.  Musculoskeletal: Negative for back pain, neck pain and neck stiffness.  Skin: Negative for rash and wound.  Neurological: Negative for dizziness,  weakness, light-headedness, numbness and headaches.  All other systems reviewed and are negative.     Allergies  Review of patient's allergies indicates no known allergies.  Home Medications   Prior to Admission medications   Medication Sig Start Date End Date Taking? Authorizing Provider  amLODipine (NORVASC) 10 MG tablet Take 10 mg by mouth daily.   Yes Historical Provider, MD  aspirin EC 81 MG tablet Take 81 mg by mouth daily.   Yes Historical Provider, MD  glipiZIDE (GLUCOTROL XL) 10 MG 24 hr tablet Take 10 mg by mouth daily with breakfast.   Yes Historical Provider, MD  insulin glargine (LANTUS) 100 UNIT/ML injection Inject 8 Units into the skin at bedtime.   Yes Historical Provider, MD  iron polysaccharides (NIFEREX) 150 MG capsule Take 150 mg by mouth daily. 05/05/15  Yes Historical Provider, MD  losartan (COZAAR) 100 MG tablet Take 100 mg by mouth daily.   Yes Historical Provider, MD  metoprolol tartrate (LOPRESSOR) 25 MG tablet Take 25 mg by mouth 2 (two) times daily.   Yes Historical Provider, MD  pravastatin (PRAVACHOL) 40 MG tablet Take 40 mg by mouth every evening.   Yes Historical Provider, MD  sitaGLIPtin (JANUVIA) 50 MG tablet Take 50 mg by mouth daily.   Yes Historical Provider, MD  tamsulosin (FLOMAX) 0.4 MG CAPS capsule Take 0.8 mg by mouth every evening.   Yes Historical Provider, MD   BP 149/87 mmHg  Pulse 98  Temp(Src) 97.9 F (36.6 C) (Axillary)  Resp 26  Ht 5\' 8"  (1.727 m)  Wt 182 lb 12.2 oz (82.9 kg)  BMI 27.80 kg/m2  SpO2 100% Physical Exam  Constitutional: He is oriented to person, place, and time. He appears well-developed and well-nourished. No distress.  HENT:  Head: Normocephalic and atraumatic.  Mouth/Throat: Oropharynx is clear and moist.  Eyes: EOM are normal. Pupils are equal, round, and reactive to light.  Neck: Normal range of motion. Neck supple.  Cardiovascular: Normal rate and regular rhythm.  Exam reveals no gallop and no friction rub.    No murmur heard. Pulmonary/Chest: Effort normal. No respiratory distress. He has no wheezes. He has rales.  Rhonchi bilateral bases  Abdominal: Soft. Bowel sounds are normal. He exhibits no distension and no mass. There is no tenderness. There is no rebound and no guarding.  Musculoskeletal: Normal range of motion. He exhibits no edema or tenderness.  No calf swelling or tenderness.  Neurological: He is alert and oriented to person, place, and time.  Moves all extremities without deficit. Sensation is grossly intact.  Skin: Skin is warm and dry. No rash noted. No erythema.  Psychiatric: He has a normal mood and affect. His behavior is normal.  Nursing note and vitals reviewed.   ED Course  Procedures (including critical care time) Labs Review Labs Reviewed  BASIC METABOLIC PANEL - Abnormal; Notable for the following:    CO2 20 (*)    Glucose, Bld 164 (*)    BUN 39 (*)    Creatinine, Ser 2.93 (*)    Calcium 8.1 (*)    GFR calc non Af Amer 21 (*)    GFR calc Af Amer 24 (*)    All other components within normal limits  CBC - Abnormal; Notable for the following:    WBC 12.2 (*)    RBC 3.30 (*)    Hemoglobin 9.9 (*)    HCT 31.4 (*)    All other components within normal limits  BRAIN NATRIURETIC PEPTIDE - Abnormal; Notable for the following:    B Natriuretic Peptide 303.6 (*)    All other components within normal limits  TROPONIN I - Abnormal; Notable for the following:    Troponin I 0.34 (*)    All other components within normal limits  TROPONIN I - Abnormal; Notable for the following:    Troponin I 0.86 (*)    All other components within normal limits  TROPONIN I - Abnormal; Notable for the following:    Troponin I 0.87 (*)    All other components within normal limits  GLUCOSE, CAPILLARY - Abnormal; Notable for the following:    Glucose-Capillary 142 (*)    All other components within normal limits  GLUCOSE, CAPILLARY - Abnormal; Notable for the following:     Glucose-Capillary 118 (*)    All other components within normal limits  GLUCOSE, CAPILLARY - Abnormal; Notable for the following:    Glucose-Capillary 117 (*)    All other components within normal limits  BLOOD GAS, ARTERIAL - Abnormal; Notable for the following:    pH, Arterial 7.290 (*)    pO2, Arterial 46.0 (*)    Bicarbonate 17.8 (*)    Acid-base deficit 7.8 (*)    All other components within normal limits  I-STAT TROPOININ, ED - Abnormal; Notable for the following:    Troponin i, poc 0.18 (*)    All other components within normal limits  CULTURE, BLOOD (ROUTINE X 2)  CULTURE, BLOOD (ROUTINE X 2)  CULTURE, EXPECTORATED  SPUTUM-ASSESSMENT  GRAM STAIN  HIV ANTIBODY (ROUTINE TESTING)  STREP PNEUMONIAE URINARY ANTIGEN  LEGIONELLA ANTIGEN, URINE  HEPARIN LEVEL (UNFRACTIONATED)  BASIC METABOLIC PANEL  CBC  I-STAT CG4 LACTIC ACID, ED    Imaging Review Dg Chest 2 View (if Patient Has Fever And/or Copd)  05/07/2015   CLINICAL DATA:  Cough and increasing shortness of breath for 3 days. Chest discomfort.  EXAM: CHEST  2 VIEW  COMPARISON:  None.  FINDINGS: Bilateral patchy lower lobe consolidations. Heart is at the upper limits normal in size. There is vascular congestion, no frank pulmonary edema. No pleural effusion or pneumothorax. No acute osseous abnormalities are seen.  IMPRESSION: 1. Patchy bilateral lower lobe opacities. This may reflect pneumonia, however aspiration is considered given distribution. Followup PA and lateral chest X-ray is recommended in 3-4 weeks following trial of antibiotic therapy to ensure resolution and exclude underlying malignancy. 2. Borderline cardiomegaly.  Mild vascular congestion.   Electronically Signed   By: Jeb Levering M.D.   On: 05/07/2015 03:05   Dg Chest Port 1 View  05/07/2015   CLINICAL DATA:  Extreme shortness of breath and coughing episode during eating today. History of hypertension and diabetes. Initial encounter.  EXAM: PORTABLE CHEST - 1  VIEW  COMPARISON:  05/07/2015.  FINDINGS: 2021 hours. The heart size and mediastinal contours are stable. Compared with the examination performed earlier today, there are worsening bilateral airspace opacities with poor definition of the pulmonary vasculature. There may be a small amount of pleural fluid on the right. No evidence of pneumothorax. The bones appear unremarkable. Telemetry leads overlie the chest.  IMPRESSION: Worsening bilateral airspace opacities compared with examination earlier today. Findings could reflect progressive aspiration pneumonia, ARDS or pulmonary edema.   Electronically Signed   By: Richardean Sale M.D.   On: 05/07/2015 20:38     EKG Interpretation   Date/Time:  Wednesday May 07 2015 01:27:23 EDT Ventricular Rate:  100 PR Interval:  144 QRS Duration: 86 QT Interval:  322 QTC Calculation: 415 R Axis:   50 Text Interpretation:  Sinus tachycardia Consider left atrial enlargement  Borderline repolarization abnormality Baseline wander in lead(s) V3  Confirmed by Lita Mains  MD, Tiasia Weberg ([id number]) on 05/07/2015 11:15:44 PM      MDM   Final diagnoses:  Community acquired pneumonia    Patient with bibasilar infiltrates on chest x-ray. Elevated creatinine. Unknown baseline. Patient also has mildly elevated troponin. He continues to deny any chest pain. This may be related to the patient's renal insufficiency.    Julianne Rice, MD 05/07/15 [phone]

## 2015-05-07 NOTE — Progress Notes (Signed)
ANTIBIOTIC CONSULT NOTE - INITIAL  Pharmacy Consult for Vancomycin, Zosyn, Levaquin Indication: pneumonia  No Known Allergies  Patient Measurements: Height: 5\' 8"  (172.7 cm) Weight: 182 lb 12.2 oz (82.9 kg) IBW/kg (Calculated) : 68.4   Vital Signs: Temp: 97.9 F (36.6 C) (06/29 2039) Temp Source: Axillary (06/29 2039) BP: 148/77 mmHg (06/29 2039) Pulse Rate: 95 (06/29 2049) Intake/Output from previous day: 06/28 0701 - 06/29 0700 In: 273.8 [I.V.:23.8; IV Piggyback:250] Out: -  Intake/Output from this shift:    Labs:  Recent Labs  05/07/15 0212  WBC 12.2*  HGB 9.9*  PLT 232  CREATININE 2.93*   Estimated Creatinine Clearance: 26.4 mL/min (by C-G formula based on Cr of 2.93). No results for input(s): VANCOTROUGH, VANCOPEAK, VANCORANDOM, GENTTROUGH, GENTPEAK, GENTRANDOM, TOBRATROUGH, TOBRAPEAK, TOBRARND, AMIKACINPEAK, AMIKACINTROU, AMIKACIN in the last 72 hours.   Microbiology: Recent Results (from the past 720 hour(s))  Blood culture (routine x 2)     Status: None (Preliminary result)   Collection Time: 05/07/15  2:52 AM  Result Value Ref Range Status   Specimen Description BLOOD RIGHT ANTECUBITAL  Final   Special Requests   Final    BOTTLES DRAWN AEROBIC AND ANAEROBIC 5CC Performed at [facility]    Culture PENDING  Incomplete   Report Status PENDING  Incomplete  Blood culture (routine x 2)     Status: None (Preliminary result)   Collection Time: 05/07/15  2:52 AM  Result Value Ref Range Status   Specimen Description BLOOD RIGHT HAND  Final   Special Requests   Final    BOTTLES DRAWN AEROBIC AND ANAEROBIC 5CC Performed at [facility]    Culture PENDING  Incomplete   Report Status PENDING  Incomplete    Medical History: Past Medical History  Diagnosis Date  . Legally blind   . DM2 (diabetes mellitus, type 2)   . Hypertension   . Hyperlipidemia   . BPH (benign prostatic hyperplasia)   . Cancer     colon cancer (malignant polyp)  removed  . Spontaneous pneumothorax     2  . Renal insufficiency     Assessment: 35 yoM with PMH of DM, diabetic retinopathy, HTN, HLD, BPH admitted with sepsis secondary to CAP, started on azithromycin and ceftriaxone.  Following a likely aspiration event tonight, pharmacy consulted to switch antibiotics to Zosyn, Vancomycin, and Levaquin.   WBC 12.2 Tmax: afebrile Renal: CKD stage 4, SCr 2.93, CrCl~25 ml/min (normalized), ~29 ml/min (CG)  Goal of Therapy:  Vancomycin trough level 15-20 mcg/ml  Doses adjusted per renal function Eradication of infection  Plan:  1.  Zosyn 3.375g IV q8h (4 hour infusion time). Continue extended interval dosing while CrCl>20 ml/min. 2.  Levaquin 750 mg IV q48h. 3.  Vancomycin 1g IV q24h.  MD, question if MRSA coverage is necessary?  Troy Young 05/07/2015,9:41 PM

## 2015-05-08 ENCOUNTER — Inpatient Hospital Stay (HOSPITAL_COMMUNITY): Payer: Medicare HMO

## 2015-05-08 DIAGNOSIS — R778 Other specified abnormalities of plasma proteins: Secondary | ICD-10-CM | POA: Insufficient documentation

## 2015-05-08 DIAGNOSIS — J9601 Acute respiratory failure with hypoxia: Secondary | ICD-10-CM

## 2015-05-08 DIAGNOSIS — R7989 Other specified abnormal findings of blood chemistry: Secondary | ICD-10-CM

## 2015-05-08 DIAGNOSIS — I5031 Acute diastolic (congestive) heart failure: Secondary | ICD-10-CM

## 2015-05-08 LAB — GLUCOSE, CAPILLARY
GLUCOSE-CAPILLARY: 81 mg/dL (ref 65–99)
GLUCOSE-CAPILLARY: 78 mg/dL (ref 65–99)
Glucose-Capillary: 116 mg/dL — ABNORMAL HIGH (ref 65–99)
Glucose-Capillary: 193 mg/dL — ABNORMAL HIGH (ref 65–99)

## 2015-05-08 LAB — LEGIONELLA ANTIGEN, URINE

## 2015-05-08 LAB — BLOOD GAS, ARTERIAL
Acid-base deficit: 7.1 mmol/L — ABNORMAL HIGH (ref 0.0–2.0)
Bicarbonate: 17.2 mEq/L — ABNORMAL LOW (ref 20.0–24.0)
Delivery systems: POSITIVE
Drawn by: 422461
Expiratory PAP: 5
FIO2: 0.45 %
Inspiratory PAP: 10
Mode: POSITIVE
O2 Saturation: 96.8 %
Patient temperature: 97.9
RATE: 8 resp/min
TCO2: 16.1 mmol/L (ref 0–100)
pCO2 arterial: 31.5 mmHg — ABNORMAL LOW (ref 35.0–45.0)
pH, Arterial: 7.354 (ref 7.350–7.450)
pO2, Arterial: 105 mmHg — ABNORMAL HIGH (ref 80.0–100.0)

## 2015-05-08 LAB — CBC
HCT: 30.1 % — ABNORMAL LOW (ref 39.0–52.0)
Hemoglobin: 9.5 g/dL — ABNORMAL LOW (ref 13.0–17.0)
MCH: 29.9 pg (ref 26.0–34.0)
MCHC: 31.6 g/dL (ref 30.0–36.0)
MCV: 94.7 fL (ref 78.0–100.0)
Platelets: 228 K/uL (ref 150–400)
RBC: 3.18 MIL/uL — ABNORMAL LOW (ref 4.22–5.81)
RDW: 13.9 % (ref 11.5–15.5)
WBC: 9.3 K/uL (ref 4.0–10.5)

## 2015-05-08 LAB — BASIC METABOLIC PANEL WITH GFR
Anion gap: 9 (ref 5–15)
BUN: 47 mg/dL — ABNORMAL HIGH (ref 6–20)
CO2: 20 mmol/L — ABNORMAL LOW (ref 22–32)
Calcium: 7.7 mg/dL — ABNORMAL LOW (ref 8.9–10.3)
Chloride: 107 mmol/L (ref 101–111)
Creatinine, Ser: 2.96 mg/dL — ABNORMAL HIGH (ref 0.61–1.24)
GFR calc Af Amer: 24 mL/min — ABNORMAL LOW
GFR calc non Af Amer: 21 mL/min — ABNORMAL LOW
Glucose, Bld: 112 mg/dL — ABNORMAL HIGH (ref 65–99)
Potassium: 4.5 mmol/L (ref 3.5–5.1)
Sodium: 136 mmol/L (ref 135–145)

## 2015-05-08 LAB — HEPARIN LEVEL (UNFRACTIONATED): HEPARIN UNFRACTIONATED: 0.58 [IU]/mL (ref 0.30–0.70)

## 2015-05-08 MED ORDER — FUROSEMIDE 10 MG/ML IJ SOLN
40.0000 mg | Freq: Once | INTRAMUSCULAR | Status: AC
  Administered 2015-05-08: 40 mg via INTRAVENOUS
  Filled 2015-05-08: qty 4

## 2015-05-08 MED ORDER — FUROSEMIDE 10 MG/ML IJ SOLN
20.0000 mg | Freq: Every day | INTRAMUSCULAR | Status: DC
  Administered 2015-05-08 – 2015-05-10 (×3): 20 mg via INTRAVENOUS
  Filled 2015-05-08 (×3): qty 2

## 2015-05-08 MED ORDER — HEPARIN SODIUM (PORCINE) 5000 UNIT/ML IJ SOLN
5000.0000 [IU] | Freq: Three times a day (TID) | INTRAMUSCULAR | Status: DC
  Administered 2015-05-08 – 2015-05-10 (×5): 5000 [IU] via SUBCUTANEOUS
  Filled 2015-05-08 (×6): qty 1

## 2015-05-08 NOTE — Progress Notes (Addendum)
[facility]                                                                              Patient Demographics  Troy Young, is a 64 y.o. male, DOB - 07-17-1951, FGH:[id number]  Admit date - 05/07/2015   Admitting Physician Etta Quill, DO  Outpatient Primary MD for the patient is No PCP Per Patient  LOS - 1   Chief Complaint  Patient presents with  . Shortness of Breath      HPI on 05/07/2015 by Dr. Jennette Kettle Troy Young is a 64 y.o. male with h/o CKD stage 4 and blindness secondary to DM2, follows with nephrology at [facility], removal of malignant colon polyp last year. Patient presents to the ED with c/o 3 day history of cough productive of this sputum, SOB worse with any exertion. His SOB got so bad that he finally called EMS this morning and was noted to be satting 83% on room air. Albuterol neb given en route provided some relief. He has no chest pain at all, has chills and subjective fever. No recent hospital stays within the past 3 months.  Assessment & Plan   Sepsis secondary to community-acquired pneumonia vs aspiration -Patient was tachycardic, tachypneic with leukocytosis upon admission -Chest x-ray: Patchy bilateral lower lobe opacities, may reflect pneumonia, follow-up PA and lateral x-ray recommended in 3-4 weeks -Initially placed on azithromycin and Rocephin, switched to vanc/zosyn and levaquin for possible aspiration pna -Blood cultures pending, sputum cultures pending -Urine strep pneumonia negative, urine legionella antigen pending -Continue neb treatments as needed -Speech consulted   Acute respiratory failure secondary to pneumonia vs pulm edema -Upon arrival to the ER, oxygen saturations were 83% -Currently on bipap.  Patient has episode of respiratory distress overnight, and moved to step down.  -Continue current treatment plan as stated above -CXR: worsening B/L airspace opacities, ? Progressive aspiration pna, ARDS, pulm  edema -Patient given Lasix 40mg  IV last night -Will continue lasix 20mg  IV daily, discontinue IVF  Elevated troponin -Peak Troponin 0.87 -No complaints of active chest pain -Possibly secondary to respiratory illness in the setting of CAD, stage IV -Cardiology consulted and appreciated, recommended outpatient ischemia workup/lexiscan myoview once patient has recovered from pneumonia -Echocardiogram: EF 67-89%, grade 2 diastolic dysfunction  Acute CHF exacerbation, diastolic -BNP 381.0 -Echo EF 17-51%, grade 2 diastolic dysfunction -Will give lasix 20mg  IV daily and monitor intake/output and daily weights  Hypertension -Continue amlodipine, losartan, metoprolol  Diabetes mellitus, type II -Continue glipizide, tradjenta, lantus  Hyperlipidemia -Continue statin  Chronic kidney disease, Stage IV -Patient follows with nephrologist at [place] to monitor BMP  Code Status: Full  Family Communication: Not at bedside  Disposition Plan: Admitted, continue to monitor  Time Spent in minutes 30 minutes  Procedures  None  Consults  Cardiology  DVT Prophylaxis heparin  Lab Results  Component Value Date   PLT 228 05/08/2015    Medications  Scheduled Meds: . amLODipine  10 mg Oral Daily  . aspirin EC  81 mg Oral Daily  . furosemide  20 mg Intravenous Daily  . glipiZIDE  10 mg Oral Q breakfast  . insulin glargine  8 Units Subcutaneous QHS  . iron polysaccharides  150 mg Oral Daily  . levofloxacin (LEVAQUIN) IV  750 mg Intravenous Q48H  . linagliptin  5 mg Oral Daily  . losartan  100 mg Oral Daily  . metoprolol tartrate  25 mg Oral BID  . piperacillin-tazobactam (ZOSYN)  IV  3.375 g Intravenous Q8H  . pravastatin  40 mg Oral QPM  . tamsulosin  0.8 mg Oral QPM  . vancomycin  1,000 mg Intravenous Q24H   Continuous Infusions: . heparin 1,100 Units/hr (05/08/15 0700)   PRN Meds:.albuterol  Antibiotics    Anti-infectives     Start     Dose/Rate Route Frequency Ordered Stop   05/08/15 0600  azithromycin (ZITHROMAX) tablet 500 mg  Status:  Discontinued     500 mg Oral Every 24 hours 05/07/15 0422 05/07/15 2134   05/08/15 0600  levofloxacin (LEVAQUIN) IVPB 750 mg     750 mg 100 mL/hr over 90 Minutes Intravenous Every 48 hours 05/07/15 2155     05/08/15 0000  cefTRIAXone (ROCEPHIN) 1 g in dextrose 5 % 50 mL IVPB  Status:  Discontinued     1 g 100 mL/hr over 30 Minutes Intravenous Every 24 hours 05/07/15 0422 05/07/15 0436   05/07/15 2300  vancomycin (VANCOCIN) IVPB 1000 mg/200 mL premix     1,000 mg 200 mL/hr over 60 Minutes Intravenous Every 24 hours 05/07/15 2155     05/07/15 2200  piperacillin-tazobactam (ZOSYN) IVPB 3.375 g     3.375 g 12.5 mL/hr over 240 Minutes Intravenous Every 8 hours 05/07/15 2154     05/07/15 1800  cefTRIAXone (ROCEPHIN) 2 g in dextrose 5 % 50 mL IVPB  Status:  Discontinued     2 g 100 mL/hr over 30 Minutes Intravenous Every 24 hours 05/07/15 0436 05/07/15 2134   05/07/15 0415  cefTRIAXone (ROCEPHIN) 1 g in dextrose 5 % 50 mL IVPB     1 g 100 mL/hr over 30 Minutes Intravenous  Once 05/07/15 0408 05/07/15 0501   05/07/15 0415  azithromycin (ZITHROMAX) 500 mg in dextrose 5 % 250 mL IVPB     500 mg 250 mL/hr over 60 Minutes Intravenous  Once 05/07/15 0408 05/07/15 3244      Subjective:   Troy Young seen and examined today.  Patient states he is feeling better.  Denies chest pain, abdominal pain, nausea/vomiting.  Patient states he is till coughing.    Objective:   Filed Vitals:   05/08/15 0200 05/08/15 0400 05/08/15 0427 05/08/15 0600  BP: 119/72 132/79  114/65  Pulse: 73 78 78 75  Temp:  98.9 F (37.2 C)    TempSrc:  Axillary    Resp: 21 21 21 19   Height:      Weight:  83.5 kg (184 lb 1.4 oz)    SpO2: 100% 100% 100% 100%    Wt Readings from Last 3 Encounters:  05/08/15 83.5 kg (184 lb 1.4 oz)     Intake/Output Summary (Last 24 hours) at 05/08/15 0754 Last data  filed at 05/08/15 0700  Gross per 24 hour  Intake 2919.3 ml  Output   2125 ml  Net  794.3 ml    Exam  General: Well developed, well nourished, NAD, appears stated age  HEENT: NCAT, mucous membranes moist.   Cardiovascular: S1 S2 auscultated, no rubs, murmurs or gallops. Regular rate and rhythm.  Respiratory: Expiratory wheezing, +rhonchi, diminished breath at the bases  Abdomen: Soft, nontender, nondistended, + bowel sounds  Extremities:  warm dry without cyanosis clubbing or edema  Neuro: AAOx3, nonfocal  Psych: Normal affect and demeanor   Data Review   Micro Results Recent Results (from the past 240 hour(s))  Blood culture (routine x 2)     Status: None (Preliminary result)   Collection Time: 05/07/15  2:52 AM  Result Value Ref Range Status   Specimen Description BLOOD RIGHT ANTECUBITAL  Final   Special Requests   Final    BOTTLES DRAWN AEROBIC AND ANAEROBIC 5CC Performed at [facility]    Culture PENDING  Incomplete   Report Status PENDING  Incomplete  Blood culture (routine x 2)     Status: None (Preliminary result)   Collection Time: 05/07/15  2:52 AM  Result Value Ref Range Status   Specimen Description BLOOD RIGHT HAND  Final   Special Requests   Final    BOTTLES DRAWN AEROBIC AND ANAEROBIC 5CC Performed at [facility]    Culture PENDING  Incomplete   Report Status PENDING  Incomplete    Radiology Reports Dg Chest 2 View (if Patient Has Fever And/or Copd)  05/07/2015   CLINICAL DATA:  Cough and increasing shortness of breath for 3 days. Chest discomfort.  EXAM: CHEST  2 VIEW  COMPARISON:  None.  FINDINGS: Bilateral patchy lower lobe consolidations. Heart is at the upper limits normal in size. There is vascular congestion, no frank pulmonary edema. No pleural effusion or pneumothorax. No acute osseous abnormalities are seen.  IMPRESSION: 1. Patchy bilateral lower lobe opacities. This may reflect pneumonia, however aspiration is considered  given distribution. Followup PA and lateral chest X-ray is recommended in 3-4 weeks following trial of antibiotic therapy to ensure resolution and exclude underlying malignancy. 2. Borderline cardiomegaly.  Mild vascular congestion.   Electronically Signed   By: Jeb Levering M.D.   On: 05/07/2015 03:05   Dg Chest Port 1 View  05/08/2015   CLINICAL DATA:  Dyspnea.  EXAM: PORTABLE CHEST - 1 VIEW  COMPARISON:  05/07/2015  FINDINGS: Patchy airspace opacities persist in both bases, partially cleared. No large effusion is evident. There is no pneumothorax appear  IMPRESSION: Persistent basilar opacities bilaterally with partial clearance.   Electronically Signed   By: Andreas Newport M.D.   On: 05/08/2015 06:37   Dg Chest Port 1 View  05/07/2015   CLINICAL DATA:  Extreme shortness of breath and coughing episode during eating today. History of hypertension and diabetes. Initial encounter.  EXAM: PORTABLE CHEST - 1 VIEW  COMPARISON:  05/07/2015.  FINDINGS: 2021 hours. The heart size and mediastinal contours are stable. Compared with the examination performed earlier today, there are worsening bilateral airspace opacities with poor definition of the pulmonary vasculature. There may be a small amount of pleural fluid on the right. No evidence of pneumothorax. The bones appear unremarkable. Telemetry leads overlie the chest.  IMPRESSION: Worsening bilateral airspace opacities compared with examination earlier today. Findings could reflect progressive aspiration pneumonia, ARDS or pulmonary edema.   Electronically Signed   By: Richardean Sale M.D.   On: 05/07/2015 20:38    CBC  Recent Labs Lab 05/07/15 0212 05/08/15 0523  WBC 12.2* 9.3  HGB 9.9* 9.5*  HCT 31.4* 30.1*  PLT 232 228  MCV 95.2 94.7  MCH 30.0 29.9  MCHC 31.5 31.6  RDW 13.7 13.9    Chemistries   Recent Labs Lab 05/07/15 0212 05/08/15 0523  NA 135 136  K 4.4 4.5  CL 107 107  CO2 20* 20*  GLUCOSE 164* 112*  BUN 39* 47*   CREATININE 2.93* 2.96*  CALCIUM 8.1* 7.7*   ------------------------------------------------------------------------------------------------------------------ estimated creatinine clearance is 26.2 mL/min (by C-G formula based on Cr of 2.96). ------------------------------------------------------------------------------------------------------------------ No results for input(s): HGBA1C in the last 72 hours. ------------------------------------------------------------------------------------------------------------------ No results for input(s): CHOL, HDL, LDLCALC, TRIG, CHOLHDL, LDLDIRECT in the last 72 hours. ------------------------------------------------------------------------------------------------------------------ No results for input(s): TSH, T4TOTAL, T3FREE, THYROIDAB in the last 72 hours.  Invalid input(s): FREET3 ------------------------------------------------------------------------------------------------------------------ No results for input(s): VITAMINB12, FOLATE, FERRITIN, TIBC, IRON, RETICCTPCT in the last 72 hours.  Coagulation profile No results for input(s): INR, PROTIME in the last 168 hours.  No results for input(s): DDIMER in the last 72 hours.  Cardiac Enzymes  Recent Labs Lab 05/07/15 0430 05/07/15 1015 05/07/15 1625  TROPONINI 0.34* 0.86* 0.87*   ------------------------------------------------------------------------------------------------------------------ Invalid input(s): POCBNP    Valerie Cones D.O. on 05/08/2015 at 7:54 AM  Between 7am to 7pm - Pager - [phone]  After 7pm go to [email or web address] - password [email or web address]  And look for the night coverage person covering for me after hours  [facility] Office  [phone]

## 2015-05-08 NOTE — Evaluation (Signed)
SLP Cancellation Note  Patient Details Name: Troy Young MRN: [id number] DOB: 08-29-1951   Cancelled treatment:       Reason Eval/Treat Not Completed: Medical issues which prohibited therapy (pt on Bipap, 14 liters oxygen)  Luanna Salk, [place] [facility] SLP [phone]

## 2015-05-08 NOTE — Progress Notes (Signed)
Patient Name: Troy Young Date of Encounter: 05/08/2015  Principal Problem:   CAP (community acquired pneumonia) Active Problems:   DM2 (diabetes mellitus, type 2)   HTN (hypertension)   CKD stage 4 due to type 2 diabetes mellitus   Community acquired pneumonia  SUBJECTIVE  Pt had a cough/aspiration after eating last. Currently on BiPAP. Denies chest pain, sob or palpitation.   CURRENT MEDS . amLODipine  10 mg Oral Daily  . aspirin EC  81 mg Oral Daily  . furosemide  20 mg Intravenous Daily  . glipiZIDE  10 mg Oral Q breakfast  . insulin glargine  8 Units Subcutaneous QHS  . iron polysaccharides  150 mg Oral Daily  . levofloxacin (LEVAQUIN) IV  750 mg Intravenous Q48H  . linagliptin  5 mg Oral Daily  . losartan  100 mg Oral Daily  . metoprolol tartrate  25 mg Oral BID  . piperacillin-tazobactam (ZOSYN)  IV  3.375 g Intravenous Q8H  . pravastatin  40 mg Oral QPM  . tamsulosin  0.8 mg Oral QPM  . vancomycin  1,000 mg Intravenous Q24H    OBJECTIVE  Filed Vitals:   05/08/15 0427 05/08/15 0600 05/08/15 0800 05/08/15 0907  BP:  114/65 121/71 121/71  Pulse: 78 75 76 72  Temp:   98.8 F (37.1 C)   TempSrc:   Axillary   Resp: 21 19 19 23   Height:      Weight:      SpO2: 100% 100% 100% 100%    Intake/Output Summary (Last 24 hours) at 05/08/15 0909 Last data filed at 05/08/15 0800  Gross per 24 hour  Intake 2930.3 ml  Output   2125 ml  Net  805.3 ml   Filed Weights   05/07/15 0534 05/08/15 0400  Weight: 182 lb 12.2 oz (82.9 kg) 184 lb 1.4 oz (83.5 kg)    PHYSICAL EXAM  General: Well developed, well nourished, NAD, appears stated age Neuro: Alert and oriented X 3. Moves all extremities spontaneously. Psych: Normal affect. HEENT:  Normal. Blind.   Neck: Supple without bruits or JVD. Lungs:  Resp regular and unlabored. Diminished breath sound and faint rales bibasilar. + Rhonchi and wheezing.  Heart: RRR no s3, s4, or murmurs. Abdomen: Soft, non-tender,  non-distended, BS + x 4.  Extremities: No clubbing, cyanosis or edema. DP/PT/Radials 2+ and equal bilaterally.  Accessory Clinical Findings  CBC  Recent Labs  05/07/15 0212 05/08/15 0523  WBC 12.2* 9.3  HGB 9.9* 9.5*  HCT 31.4* 30.1*  MCV 95.2 94.7  PLT 232 283   Basic Metabolic Panel  Recent Labs  05/07/15 0212 05/08/15 0523  NA 135 136  K 4.4 4.5  CL 107 107  CO2 20* 20*  GLUCOSE 164* 112*  BUN 39* 47*  CREATININE 2.93* 2.96*  CALCIUM 8.1* 7.7*     Recent Labs  05/07/15 0430 05/07/15 1015 05/07/15 1625  TROPONINI 0.34* 0.86* 0.87*    TELE  NSR at rate of 70s.   Radiology/Studies  Dg Chest 2 View (if Patient Has Fever And/or Copd)  05/07/2015   CLINICAL DATA:  Cough and increasing shortness of breath for 3 days. Chest discomfort.  EXAM: CHEST  2 VIEW  COMPARISON:  None.  FINDINGS: Bilateral patchy lower lobe consolidations. Heart is at the upper limits normal in size. There is vascular congestion, no frank pulmonary edema. No pleural effusion or pneumothorax. No acute osseous abnormalities are seen.  IMPRESSION: 1. Patchy bilateral lower lobe opacities. This may  reflect pneumonia, however aspiration is considered given distribution. Followup PA and lateral chest X-ray is recommended in 3-4 weeks following trial of antibiotic therapy to ensure resolution and exclude underlying malignancy. 2. Borderline cardiomegaly.  Mild vascular congestion.   Electronically Signed   By: Jeb Levering M.D.   On: 05/07/2015 03:05   Dg Chest Port 1 View  05/08/2015   CLINICAL DATA:  Dyspnea.  EXAM: PORTABLE CHEST - 1 VIEW  COMPARISON:  05/07/2015  FINDINGS: Patchy airspace opacities persist in both bases, partially cleared. No large effusion is evident. There is no pneumothorax appear  IMPRESSION: Persistent basilar opacities bilaterally with partial clearance.   Electronically Signed   By: Andreas Newport M.D.   On: 05/08/2015 06:37   Dg Chest Port 1 View  05/07/2015    CLINICAL DATA:  Extreme shortness of breath and coughing episode during eating today. History of hypertension and diabetes. Initial encounter.  EXAM: PORTABLE CHEST - 1 VIEW  COMPARISON:  05/07/2015.  FINDINGS: 2021 hours. The heart size and mediastinal contours are stable. Compared with the examination performed earlier today, there are worsening bilateral airspace opacities with poor definition of the pulmonary vasculature. There may be a small amount of pleural fluid on the right. No evidence of pneumothorax. The bones appear unremarkable. Telemetry leads overlie the chest.  IMPRESSION: Worsening bilateral airspace opacities compared with examination earlier today. Findings could reflect progressive aspiration pneumonia, ARDS or pulmonary edema.   Electronically Signed   By: Richardean Sale M.D.   On: 05/07/2015 20:38   LV EF: 50% -  55%  ------------------------------------------------------------------- Indications:   Shortness of breath 786.05.  ------------------------------------------------------------------- History:  PMH: Elevated troponin. Risk factors: Hypertension. Diabetes mellitus.  ------------------------------------------------------------------- Study Conclusions  - Left ventricle: There is akinesis of the mid anterior and anteroseptal and apical anterior and septal walls. The cavity size was normal. There was mild concentric hypertrophy. Systolic function was normal. The estimated ejection fraction was in the range of 50% to 55%. Wall motion was normal; there were no regional wall motion abnormalities. Features are consistent with a pseudonormal left ventricular filling pattern, with concomitant abnormal relaxation and increased filling pressure (grade 2 diastolic dysfunction). Doppler parameters are consistent with elevated ventricular end-diastolic filling pressure. - Aortic valve: Trileaflet; normal thickness leaflets. Transvalvular  velocity was within the normal range. There was no stenosis. There was no regurgitation. - Aortic root: The aortic root was normal in size. - Mitral valve: Structurally normal valve. There was mild regurgitation. - Left atrium: The atrium was normal in size. - Right ventricle: The cavity size was normal. Wall thickness was normal. Systolic function was normal. - Tricuspid valve: There was moderate regurgitation. - Pulmonary arteries: Systolic pressure was severely increased. PA peak pressure: 61 mm Hg (S). - Inferior vena cava: The vessel was normal in size. - Pericardium, extracardiac: There was no pericardial effusion.  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 FIlling pressures are elevated. There is severe pulmonary hypertension.  ASSESSMENT AND PLAN    1. Elevated troponin - trop trend 0.34->0.86->-0.87. Asymptomatic. 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 HTN - stable - Continue current  regimen.  3. DM - Per primary  4. Sepsis secondary to CAP vs aspiration - Per primary - pending speech eval  5. Acute diastolic heart failure - Echo as above - BNP of 303.6. Given IV lasix 40mg  early morning today and now on IV lasix 20mg  daily. I/O positive 990 ml since admission. Strict I/O and daily weight.   6. Acute respiratory failure secondary to pneumonia vs pulm edema - per primary  Signed, Bhagat,Bhavinkumar PA-C Pager [phone]  History and all data above reviewed.  Patient examined.  I agree with the findings as above.  He says he became short of breath  and started coughing while eating.  Feels much better now on CPAP.  He has not had any chest pain. The patient exam reveals COR:RRR  ,  Lungs: Decreased breath sounds with coarse crackles  ,  Abd: Positive bowel sounds, no rebound no guarding, Ext No edema  .  All available labs, radiology testing, previous records reviewed. Agree with documented assessment and plan. Elevated troponin:  Echo would suggest a previous MI.  However, no active signs of ischemia.  He would be high risk for cath with his CKD.  Therefore, when he has recovered from his pneumonia he will get an out patient Lexiscan Myoview.   Minus Breeding  9:56 AM  05/08/2015

## 2015-05-08 NOTE — Evaluation (Addendum)
Clinical/Bedside Swallow Evaluation Patient Details  Name: Troy Young MRN: [id number] Date of Birth: [birth year]  Today's Date: 05/08/2015 Time: SLP Start Time (ACUTE ONLY): 1700 SLP Stop Time (ACUTE ONLY): 1749 SLP Time Calculation (min) (ACUTE ONLY): 49 min  Past Medical History:  Past Medical History  Diagnosis Date  . Legally blind   . DM2 (diabetes mellitus, type 2)   . Hypertension   . Hyperlipidemia   . BPH (benign prostatic hyperplasia)   . Cancer     colon cancer (malignant polyp) removed  . Spontaneous pneumothorax     2  . Renal insufficiency    Past Surgical History:  Past Surgical History  Procedure Laterality Date  . Polyp removed     HPI:  64 yo male adm to [facility] with CAP.  Pt PMH + for spontaneous pneumothorax, blindness, DM2,CKD stage 4.  Pt with episode last evening of coughing toward end of meal resulting in decrease in oxygen saturation and requirment of Bipap.  CXR showed persistent bilateral opacity.  Sister of pt reports a single episode of coughing at end of meal at home prior to admit.  Pt denies dysphagia symptoms.    Assessment / Plan / Recommendation Clinical Impression  Negative CN exam noted and pt with strong clear voice.  Initially cough x1 noted with multiple swallows with intake of applesauce.  Incidence completely abated with further intake- ? secretion retention that was adequately cleared.  S  LP observed pt consuming entire 4 ounces applesauce, 4 graham crackers and approximately 16 ounces water.  No indication of airway compromise or residuals noted with pt consuming slowly and methodically.   Pt also noted to exhale after swallow - maximizing airway protection and oxygen saturation remained stable.    SLP can not rule out silent aspiration at bedside nor possible esophageal component with his DM.  Pt did report coughing issue occured at END of meal only.  Advised pt to consume small amount only to assure tolerance.   Recommend initiate diet  with STRICT aspiration/reflux precautions. Using teach back, educated pt/family to findings/recommendations.  Will follow up x1 to assure tolerance and assess for instrumental evaluation indication.     Aspiration Risk  Mild    Diet Recommendation Age appropriate regular solids;Thin   Medication Administration:  (as tolerated) Compensations: Slow rate;Small sips/bites    Other  Recommendations Oral Care Recommendations: Oral care BID Other Recommendations: Clarify dietary restrictions   Follow Up Recommendations       Frequency and Duration min 1 x/week  1 week   Pertinent Vitals/Pain Afebrile, decreased     Swallow Study Prior Functional Status   pt denies dysphagia nor reflux/esophageal issues prior to admit    General Date of Onset: 05/08/15 Other Pertinent Information: 64 yo male adm to [facility] with CAP.  Pt PMH + for spontaneous pneumothorax, blindness, DM2,CKD stage 4.  Pt with episode last evening of coughing toward end of meal resulting in decrease in oxygen saturation and requirment of Bipap.  CXR showed persistent bilateral opacity.  Sister of pt reports a single episode of coughing at end of meal at home prior to admit.  Pt denies dysphagia symptoms.  Type of Study: Bedside swallow evaluation Diet Prior to this Study: NPO Temperature Spikes Noted: No Respiratory Status: Supplemental O2 delivered via (comment) (off bipap since 1 pm and on 2 liters oxygen) History of Recent Intubation: No Behavior/Cognition: Alert;Cooperative;Pleasant mood Oral Cavity - Dentition: Adequate natural dentition/normal for age (partial/artificial dentition) Self-Feeding Abilities: Able  to feed self Patient Positioning: Upright in bed Baseline Vocal Quality: Normal Volitional Cough: Strong Volitional Swallow: Able to elicit    Oral/Motor/Sensory Function Overall Oral Motor/Sensory Function: Appears within functional limits for tasks assessed   Ice Chips Ice chips: Within functional  limits Presentation: Spoon   Thin Liquid Thin Liquid: Within functional limits Presentation: Self Fed;Cup;Straw;Spoon    Nectar Thick Nectar Thick Liquid: Not tested   Honey Thick Honey Thick Liquid: Not tested   Puree Puree: Within functional limits Presentation: Self Fed;Spoon Other Comments: initially cough x1 noted with multiple swallows that completely abated with further intake- ? secretion retention that cleared   Solid   GO    Solid: Within functional limits Presentation: [place], [place] [facility] SLP [phone]

## 2015-05-08 NOTE — Significant Event (Signed)
Rapid Response Event Note  Overview: Time Called: 1950 Arrival Time: 2000 Event Type: Respiratory  Initial Focused Assessment:Patient with sudden resp distress after eating. He had a coughing episode and sats decreased mid 70%. Upon arrival patient on NRB. Sat were >95. Respirations labored and tachypnic. Gradually less distress but in need of NRB to maintain comfort and sats.    Interventions:PCXR, NRB AND ABG   Event Summary: Transferred to 1233 for bipap Name of Physician Notified: Ruthell Rummage NP at 2000    at    Outcome: Transferred (Comment) (TO 1233 FOR BIPAP)  Event End Time: 2030  Pricilla Riffle

## 2015-05-08 NOTE — Progress Notes (Addendum)
Pharmacy - Heparin dosing  Assessment: 63 yoM on heparin for elevated troponin.  Per Cards "Echo would suggest a previous MI. However, no active signs of ischemia. He would be high risk for cath with his CKD. Therefore, when he has recovered from his pneumonia he will get an out patient Lexiscan Myoview".  To transition to SQ heparin per pharmacy.  Plan:  Stop heparin gtt now (2pm)  With renal impairment, will SQ heparin 5000 units q8 hr at [place] will sign off note writing and follow peripherally for renal function or signs of bleeding  Reuel Boom, PharmD, BCPS Pager: [phone] 05/08/2015, 2:02 PM

## 2015-05-08 NOTE — Progress Notes (Signed)
ANTICOAGULATION CONSULT NOTE - F/u Consult  Pharmacy Consult for heparin Indication: chest pain/ACS  No Known Allergies  Patient Measurements: Height: 5\' 8"  (172.7 cm) Weight: 182 lb 12.2 oz (82.9 kg) IBW/kg (Calculated) : 68.4 Heparin Dosing Weight: 83kg  Vital Signs: Temp: 97.9 F (36.6 C) (06/29 2039) Temp Source: Axillary (06/29 2039) BP: 149/87 mmHg (06/29 2347) Pulse Rate: 88 (06/29 2347)  Labs:  Recent Labs  05/07/15 0212 05/07/15 0430 05/07/15 1015 05/07/15 1625 05/07/15 2310  HGB 9.9*  --   --   --   --   HCT 31.4*  --   --   --   --   PLT 232  --   --   --   --   HEPARINUNFRC  --   --   --   --  0.55  CREATININE 2.93*  --   --   --   --   TROPONINI  --  0.34* 0.86* 0.87*  --     Estimated Creatinine Clearance: 26.4 mL/min (by C-G formula based on Cr of 2.93).   Medical History: Past Medical History  Diagnosis Date  . Legally blind   . DM2 (diabetes mellitus, type 2)   . Hypertension   . Hyperlipidemia   . BPH (benign prostatic hyperplasia)   . Cancer     colon cancer (malignant polyp) removed  . Spontaneous pneumothorax     2  . Renal insufficiency     Assessment: 40 YOM presented to ED with SOB, troponin trending up.  Patient denies chest pain. Pharmacy asked to dose heparin gtt. No know previous history of CAD 05/07/15  CBC: Hgb = 9.9, pltc WNL  On SQ heparin, last dose 6am  Renal: stage IV CKD, CrCl < 2ml/min  2310 HL=0.55, no problems reported  Goal of Therapy:  Heparin level 0.3-0.7 units/ml Monitor platelets by anticoagulation protocol: Yes   Plan:   Continue heparin infusion at 1100 units/hr  Daily heparin level and CBC  Continue to monitor H&H and platelets   Lawana Pai R 05/08/2015,12:10 AM

## 2015-05-08 NOTE — Progress Notes (Signed)
Assessed pt and BIPAP was taking off at this time. No signs of respiratory distress. Pt was placed on [place] 2L/min where O2 saturations are 100% currently at this time. RN aware. RT will continue to monitor.

## 2015-05-09 LAB — BASIC METABOLIC PANEL
ANION GAP: 10 (ref 5–15)
BUN: 42 mg/dL — AB (ref 6–20)
CO2: 22 mmol/L (ref 22–32)
Calcium: 8.2 mg/dL — ABNORMAL LOW (ref 8.9–10.3)
Chloride: 105 mmol/L (ref 101–111)
Creatinine, Ser: 2.95 mg/dL — ABNORMAL HIGH (ref 0.61–1.24)
GFR calc Af Amer: 24 mL/min — ABNORMAL LOW (ref >60)
GFR calc non Af Amer: 21 mL/min — ABNORMAL LOW (ref >60)
GLUCOSE: 109 mg/dL — AB (ref 65–99)
Potassium: 4.3 mmol/L (ref 3.5–5.1)
SODIUM: 137 mmol/L (ref 135–145)

## 2015-05-09 LAB — CBC
HEMATOCRIT: 28.6 % — AB (ref 39.0–52.0)
HEMOGLOBIN: 9.1 g/dL — AB (ref 13.0–17.0)
MCH: 30.1 pg (ref 26.0–34.0)
MCHC: 31.8 g/dL (ref 30.0–36.0)
MCV: 94.7 fL (ref 78.0–100.0)
Platelets: 229 10*3/uL (ref 150–400)
RBC: 3.02 MIL/uL — ABNORMAL LOW (ref 4.22–5.81)
RDW: 13.9 % (ref 11.5–15.5)
WBC: 7.3 10*3/uL (ref 4.0–10.5)

## 2015-05-09 LAB — MRSA PCR SCREENING: MRSA by PCR: NEGATIVE

## 2015-05-09 LAB — GLUCOSE, CAPILLARY
GLUCOSE-CAPILLARY: 87 mg/dL (ref 65–99)
Glucose-Capillary: 129 mg/dL — ABNORMAL HIGH (ref 65–99)
Glucose-Capillary: 119 mg/dL — ABNORMAL HIGH (ref 65–99)

## 2015-05-09 MED ORDER — LEVOFLOXACIN 750 MG PO TABS
750.0000 mg | ORAL_TABLET | ORAL | Status: DC
  Administered 2015-05-10: 750 mg via ORAL
  Filled 2015-05-09: qty 1

## 2015-05-09 NOTE — Progress Notes (Signed)
Pharmacy IV to PO conversion  This patient is receiving levofloxacin by the intravenous route. Based on criteria approved by the Pharmacy and Therapeutics Committee, and the Infectious Disease Division, the antibiotic(s) is/are being converted to equivalent oral dose form(s). These criteria include:   Patient being treated for a respiratory tract infection, urinary tract infection, cellulitis, or Clostridium Difficile Associated Diarrhea  The patient is not neutropenic and does not exhibit a GI malabsorption state  The patient is eating (either orally or per tube) and/or has been taking other orally administered medications for at least 24 hours.  The patient is improving clinically (physician assessment and a 24-hour Tmax of <=100.5 F)  If you have any questions about this conversion, please contact the Pharmacy Department (ext [phone]).  Thank you.  Reuel Boom, PharmD Pager: [phone] 05/09/2015, 2:22 PM

## 2015-05-09 NOTE — Progress Notes (Signed)
Patient Name: Troy Young Date of Encounter: 05/09/2015  Principal Problem:   CAP (community acquired pneumonia) Active Problems:   DM2 (diabetes mellitus, type 2)   HTN (hypertension)   CKD stage 4 due to type 2 diabetes mellitus   Community acquired pneumonia  SUBJECTIVE  Feels much better. Denies chest pain, sob or palpitation. Cough only once.   CURRENT MEDS . amLODipine  10 mg Oral Daily  . aspirin EC  81 mg Oral Daily  . furosemide  20 mg Intravenous Daily  . glipiZIDE  10 mg Oral Q breakfast  . heparin subcutaneous  5,000 Units Subcutaneous 3 times per day  . iron polysaccharides  150 mg Oral Daily  . levofloxacin (LEVAQUIN) IV  750 mg Intravenous Q48H  . linagliptin  5 mg Oral Daily  . losartan  100 mg Oral Daily  . metoprolol tartrate  25 mg Oral BID  . piperacillin-tazobactam (ZOSYN)  IV  3.375 g Intravenous Q8H  . pravastatin  40 mg Oral QPM  . tamsulosin  0.8 mg Oral QPM  . vancomycin  1,000 mg Intravenous Q24H    OBJECTIVE  Filed Vitals:   05/09/15 0400 05/09/15 0500 05/09/15 0600 05/09/15 0700  BP: 117/67     Pulse: 75     Temp: 98.9 F (37.2 C)     TempSrc: Axillary     Resp: 22     Height:      Weight:  185 lb 3 oz (84 kg)    SpO2: 96%  91% 96%    Intake/Output Summary (Last 24 hours) at 05/09/15 0737 Last data filed at 05/09/15 0524  Gross per 24 hour  Intake 1025.9 ml  Output   2625 ml  Net -1599.1 ml   Filed Weights   05/08/15 0400 05/08/15 1115 05/09/15 0500  Weight: 184 lb 1.4 oz (83.5 kg) 183 lb 10.3 oz (83.3 kg) 185 lb 3 oz (84 kg)    PHYSICAL EXAM  General: Well developed, well nourished, NAD, appears stated age. Nasal cannula in place.  Neuro: Alert and oriented X 3. Moves all extremities spontaneously. Psych: Normal affect. HEENT:  Normal. Blind.   Neck: Supple without bruits or JVD. Lungs:  Resp regular and unlabored. Diminished breath sound. Scattered Rhonchi. Heart: RRR no s3, s4, or murmurs. Abdomen: Soft,  non-tender, non-distended, BS + x 4.  Extremities: No clubbing, cyanosis or edema. DP/PT/Radials 2+ and equal bilaterally.  Accessory Clinical Findings  CBC  Recent Labs  05/08/15 0523 05/09/15 0347  WBC 9.3 7.3  HGB 9.5* 9.1*  HCT 30.1* 28.6*  MCV 94.7 94.7  PLT 228 902   Basic Metabolic Panel  Recent Labs  05/07/15 0212 05/08/15 0523  NA 135 136  K 4.4 4.5  CL 107 107  CO2 20* 20*  GLUCOSE 164* 112*  BUN 39* 47*  CREATININE 2.93* 2.96*  CALCIUM 8.1* 7.7*     Recent Labs  05/07/15 0430 05/07/15 1015 05/07/15 1625  TROPONINI 0.34* 0.86* 0.87*    TELE  NSR at rate of 70s.   Radiology/Studies  Dg Chest 2 View (if Patient Has Fever And/or Copd)  05/07/2015   CLINICAL DATA:  Cough and increasing shortness of breath for 3 days. Chest discomfort.  EXAM: CHEST  2 VIEW  COMPARISON:  None.  FINDINGS: Bilateral patchy lower lobe consolidations. Heart is at the upper limits normal in size. There is vascular congestion, no frank pulmonary edema. No pleural effusion or pneumothorax. No acute osseous abnormalities are seen.  IMPRESSION: 1. Patchy bilateral lower lobe opacities. This may reflect pneumonia, however aspiration is considered given distribution. Followup PA and lateral chest X-ray is recommended in 3-4 weeks following trial of antibiotic therapy to ensure resolution and exclude underlying malignancy. 2. Borderline cardiomegaly.  Mild vascular congestion.   Electronically Signed   By: Jeb Levering M.D.   On: 05/07/2015 03:05   Dg Chest Port 1 View  05/08/2015   CLINICAL DATA:  Dyspnea.  EXAM: PORTABLE CHEST - 1 VIEW  COMPARISON:  05/07/2015  FINDINGS: Patchy airspace opacities persist in both bases, partially cleared. No large effusion is evident. There is no pneumothorax appear  IMPRESSION: Persistent basilar opacities bilaterally with partial clearance.   Electronically Signed   By: Andreas Newport M.D.   On: 05/08/2015 06:37   Dg Chest Port 1  View  05/07/2015   CLINICAL DATA:  Extreme shortness of breath and coughing episode during eating today. History of hypertension and diabetes. Initial encounter.  EXAM: PORTABLE CHEST - 1 VIEW  COMPARISON:  05/07/2015.  FINDINGS: 2021 hours. The heart size and mediastinal contours are stable. Compared with the examination performed earlier today, there are worsening bilateral airspace opacities with poor definition of the pulmonary vasculature. There may be a small amount of pleural fluid on the right. No evidence of pneumothorax. The bones appear unremarkable. Telemetry leads overlie the chest.  IMPRESSION: Worsening bilateral airspace opacities compared with examination earlier today. Findings could reflect progressive aspiration pneumonia, ARDS or pulmonary edema.   Electronically Signed   By: Richardean Sale M.D.   On: 05/07/2015 20:38   LV EF: 50% -  55%  ------------------------------------------------------------------- Indications:   Shortness of breath 786.05.  ------------------------------------------------------------------- History:  PMH: Elevated troponin. Risk factors: Hypertension. Diabetes mellitus.  ------------------------------------------------------------------- Study Conclusions  - Left ventricle: There is akinesis of the mid anterior and anteroseptal and apical anterior and septal walls. The cavity size was normal. There was mild concentric hypertrophy. Systolic function was normal. The estimated ejection fraction was in the range of 50% to 55%. Wall motion was normal; there were no regional wall motion abnormalities. Features are consistent with a pseudonormal left ventricular filling pattern, with concomitant abnormal relaxation and increased filling pressure (grade 2 diastolic dysfunction). Doppler parameters are consistent with elevated ventricular end-diastolic filling pressure. - Aortic valve: Trileaflet; normal thickness  leaflets. Transvalvular velocity was within the normal range. There was no stenosis. There was no regurgitation. - Aortic root: The aortic root was normal in size. - Mitral valve: Structurally normal valve. There was mild regurgitation. - Left atrium: The atrium was normal in size. - Right ventricle: The cavity size was normal. Wall thickness was normal. Systolic function was normal. - Tricuspid valve: There was moderate regurgitation. - Pulmonary arteries: Systolic pressure was severely increased. PA peak pressure: 61 mm Hg (S). - Inferior vena cava: The vessel was normal in size. - Pericardium, extracardiac: There was no pericardial effusion.  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 FIlling pressures are elevated. There is severe pulmonary hypertension.  ASSESSMENT AND PLAN    1. Elevated troponin - Trop trend 0.34->0.86->-0.87. Asymptomatic. - Echo 6/29 showed LV EF of 50-55%; akinesis in the mid anterior and anteroseptal, and apical anterior and septal walls suggestive of infarct/hibernating myocardium in the mid LAD territory;  Severe pulmonary HTN; grade 2 DD; mild mitral regur; moderate tricuspid regur. - Continue ASA, BB,  ARB, statin - plan for outpatient Lexiscan myoview once recover from pneumonia.   2. HTN - stable and well controlled - Continue current regimen.  3. DM - Per primary  4. Sepsis secondary to CAP vs aspiration - Per primary - pending speech eval  5. Acute diastolic heart failure - Echo as above - BNP of 303.6. Given IV lasix 40mg  early morning today and now on IV lasix 20mg  daily. I/O negative 1.6L ml in last 24 hours. However weight up 2lb, doubt accurate weight.  Strict I/O and daily weight.  - Creatinine of 2.96 yesterday, no BMET ordered today, will get daily.   6. Acute respiratory  failure secondary to pneumonia vs pulm edema - per primary - breathing much improved, feels better, now on nasal cannula.   7. Acute on chronic kidney disease, stage IV - As above - Follows with nephrologist at [place] PA-C Pager [phone]  History and all data above reviewed.  Patient examined.  I agree with the findings as above.  Breathing OK.  No chest pain.  The patient exam reveals COR:RRR  ,  Lungs: Clear  ,  Abd: Positive bowel sounds, no rebound no guarding, Ext No edema  .  All available labs, radiology testing, previous records reviewed. Agree with documented assessment and plan. Elevated enzymes:  Out patient stress test will be planned after he comes back for follow up with [place].    Jeneen Rinks Talyia Allende  10:20 AM  05/09/2015

## 2015-05-09 NOTE — Progress Notes (Signed)
[facility]                                                                              Patient Demographics  Troy Young, is a 64 y.o. male, DOB - Jun 19, 1951, ION:[id number]  Admit date - 05/07/2015   Admitting Physician Etta Quill, DO  Outpatient Primary MD for the patient is No PCP Per Patient  LOS - 2   Chief Complaint  Patient presents with  . Shortness of Breath      HPI on 05/07/2015 by Dr. Jennette Kettle Troy Young is a 64 y.o. 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 Patient presents to the ED with c/o 3 day history of cough productive of this sputum, SOB worse with any exertion. His SOB got so bad that he finally called EMS this morning and was noted to be satting 83% on room air. Albuterol neb given en route provided some relief. He has no chest pain at all, has chills and subjective fever. No recent hospital stays within the past 3 months.  Assessment & Plan   Sepsis secondary to community-acquired pneumonia vs aspiration -Patient was tachycardic, tachypneic with leukocytosis upon admission -Chest x-ray: Patchy bilateral lower lobe opacities, may reflect pneumonia, follow-up PA and lateral x-ray recommended in 3-4 weeks -Initially placed on azithromycin and Rocephin, switched to vanc/zosyn and levaquin for possible aspiration pna -Will discontinue vanc and zosyn. -Leukocytosis resolved -Blood cultures show no growth to date -Urine strep pneumonia and legionella antigens negative -Continue neb treatments as needed -Speech consulted (for possible aspiration) and recommended carb modified diet  Acute respiratory failure secondary to pneumonia vs pulm edema -Upon arrival to the ER, oxygen saturations were 83% -Patient had acute respiratory distress the night of 6/30, and moved to step down.  He was placed on Bipap. -Patient was successfully weaned off of  bipap and nasal canula.  Currently on room air and maintaining his oxygen saturations.   -Continue current treatment plan as stated above -CXR: worsening B/L airspace opacities, ? Progressive aspiration pna, ARDS, pulm edema -Continue lasix 20mg  IV dialy.   -Will consult PT  Elevated troponin -Peak Troponin 0.87 -No complaints of active chest pain -Possibly secondary to respiratory illness in the setting of CAD, stage IV -Cardiology consulted and appreciated, recommended outpatient ischemia workup/lexiscan myoview once patient has recovered from pneumonia -Echocardiogram: EF 24-40%, grade 2 diastolic dysfunction -Continue losartan, metoprolol, and aspirin   Acute CHF exacerbation, diastolic -BNP 102.7 -Echo EF 25-36%, grade 2 diastolic dysfunction -Continue lasix 20mg  IV daily and monitor intake/output and daily weights -UOP over the past 24hrs 2625cc -Continue losartan, metoprolol, and aspirin   Hypertension -Continue amlodipine, losartan, metoprolol  Diabetes mellitus, type II -Continue glipizide, tradjenta, lantus  Hyperlipidemia -Continue statin  Chronic kidney disease, Stage IV -Patient follows with nephrologist at [place] to monitor BMP, pending Cr this morning  Code Status: Full  Family Communication: Not at bedside  Disposition Plan: Admitted, continue to monitor.  Will transfer out of step down to telemetry.  Likely discharge within the next 24-48 hrs.  Time Spent  in minutes 30 minutes  Procedures  Echocardiogram  Consults  Cardiology  DVT Prophylaxis heparin  Lab Results  Component Value Date   PLT 229 05/09/2015    Medications  Scheduled Meds: . amLODipine  10 mg Oral Daily  . aspirin EC  81 mg Oral Daily  . furosemide  20 mg Intravenous Daily  . glipiZIDE  10 mg Oral Q breakfast  . heparin subcutaneous  5,000 Units Subcutaneous 3 times per day  . iron polysaccharides  150 mg Oral Daily  . levofloxacin  (LEVAQUIN) IV  750 mg Intravenous Q48H  . linagliptin  5 mg Oral Daily  . losartan  100 mg Oral Daily  . metoprolol tartrate  25 mg Oral BID  . piperacillin-tazobactam (ZOSYN)  IV  3.375 g Intravenous Q8H  . pravastatin  40 mg Oral QPM  . tamsulosin  0.8 mg Oral QPM  . vancomycin  1,000 mg Intravenous Q24H   Continuous Infusions:   PRN Meds:.albuterol  Antibiotics    Anti-infectives    Start     Dose/Rate Route Frequency Ordered Stop   05/08/15 0600  azithromycin (ZITHROMAX) tablet 500 mg  Status:  Discontinued     500 mg Oral Every 24 hours 05/07/15 0422 05/07/15 2134   05/08/15 0600  levofloxacin (LEVAQUIN) IVPB 750 mg     750 mg 100 mL/hr over 90 Minutes Intravenous Every 48 hours 05/07/15 2155     05/08/15 0000  cefTRIAXone (ROCEPHIN) 1 g in dextrose 5 % 50 mL IVPB  Status:  Discontinued     1 g 100 mL/hr over 30 Minutes Intravenous Every 24 hours 05/07/15 0422 05/07/15 0436   05/07/15 2300  vancomycin (VANCOCIN) IVPB 1000 mg/200 mL premix     1,000 mg 200 mL/hr over 60 Minutes Intravenous Every 24 hours 05/07/15 2155     05/07/15 2200  piperacillin-tazobactam (ZOSYN) IVPB 3.375 g     3.375 g 12.5 mL/hr over 240 Minutes Intravenous Every 8 hours 05/07/15 2154     05/07/15 1800  cefTRIAXone (ROCEPHIN) 2 g in dextrose 5 % 50 mL IVPB  Status:  Discontinued     2 g 100 mL/hr over 30 Minutes Intravenous Every 24 hours 05/07/15 0436 05/07/15 2134   05/07/15 0415  cefTRIAXone (ROCEPHIN) 1 g in dextrose 5 % 50 mL IVPB     1 g 100 mL/hr over 30 Minutes Intravenous  Once 05/07/15 0408 05/07/15 0501   05/07/15 0415  azithromycin (ZITHROMAX) 500 mg in dextrose 5 % 250 mL IVPB     500 mg 250 mL/hr over 60 Minutes Intravenous  Once 05/07/15 0408 05/07/15 4098      Subjective:   Troy Young seen and examined today.  Patient states he is feeling better.  Denies chest pain, abdominal pain, nausea/vomiting.  He inquires about going home.   Objective:   Filed Vitals:   05/09/15  0700 05/09/15 0800 05/09/15 0906 05/09/15 0929  BP:   132/58 126/57  Pulse:   84 91  Temp:  98.6 F (37 C)    TempSrc:  Oral    Resp:   21   Height:      Weight:      SpO2: 96%  95%     Wt Readings from Last 3 Encounters:  05/09/15 84 kg (185 lb 3 oz)     Intake/Output Summary (Last 24 hours) at 05/09/15 0945 Last data filed at 05/09/15 0524  Gross per 24 hour  Intake 1003.9 ml  Output  2625 ml  Net -1621.1 ml    Exam  General: Well developed, well nourished, no distress  HEENT: NCAT, mucous membranes moist.   Cardiovascular: S1 S2 auscultated, RRR, no murmurs  Respiratory: Lung sound improving. Slighly diminished at the bases.  Abdomen: Soft, nontender, nondistended, + bowel sounds  Extremities: warm dry without cyanosis clubbing or edema  Neuro: AAOx3, nonfocal  Psych: Normal affect and demeanor   Data Review   Micro Results Recent Results (from the past 240 hour(s))  Blood culture (routine x 2)     Status: None (Preliminary result)   Collection Time: 05/07/15  2:52 AM  Result Value Ref Range Status   Specimen Description BLOOD RIGHT ANTECUBITAL  Final   Special Requests BOTTLES DRAWN AEROBIC AND ANAEROBIC 5CC  Final   Culture   Final    NO GROWTH 1 DAY Performed at [facility]    Report Status PENDING  Incomplete  Blood culture (routine x 2)     Status: None (Preliminary result)   Collection Time: 05/07/15  2:52 AM  Result Value Ref Range Status   Specimen Description BLOOD RIGHT HAND  Final   Special Requests BOTTLES DRAWN AEROBIC AND ANAEROBIC 5CC  Final   Culture   Final    NO GROWTH 1 DAY Performed at [facility]    Report Status PENDING  Incomplete    Radiology Reports Dg Chest 2 View (if Patient Has Fever And/or Copd)  05/07/2015   CLINICAL DATA:  Cough and increasing shortness of breath for 3 days. Chest discomfort.  EXAM: CHEST  2 VIEW  COMPARISON:  None.  FINDINGS: Bilateral patchy lower lobe consolidations. Heart is  at the upper limits normal in size. There is vascular congestion, no frank pulmonary edema. No pleural effusion or pneumothorax. No acute osseous abnormalities are seen.  IMPRESSION: 1. Patchy bilateral lower lobe opacities. This may reflect pneumonia, however aspiration is considered given distribution. Followup PA and lateral chest X-ray is recommended in 3-4 weeks following trial of antibiotic therapy to ensure resolution and exclude underlying malignancy. 2. Borderline cardiomegaly.  Mild vascular congestion.   Electronically Signed   By: Jeb Levering M.D.   On: 05/07/2015 03:05   Dg Chest Port 1 View  05/08/2015   CLINICAL DATA:  Dyspnea.  EXAM: PORTABLE CHEST - 1 VIEW  COMPARISON:  05/07/2015  FINDINGS: Patchy airspace opacities persist in both bases, partially cleared. No large effusion is evident. There is no pneumothorax appear  IMPRESSION: Persistent basilar opacities bilaterally with partial clearance.   Electronically Signed   By: Andreas Newport M.D.   On: 05/08/2015 06:37   Dg Chest Port 1 View  05/07/2015   CLINICAL DATA:  Extreme shortness of breath and coughing episode during eating today. History of hypertension and diabetes. Initial encounter.  EXAM: PORTABLE CHEST - 1 VIEW  COMPARISON:  05/07/2015.  FINDINGS: 2021 hours. The heart size and mediastinal contours are stable. Compared with the examination performed earlier today, there are worsening bilateral airspace opacities with poor definition of the pulmonary vasculature. There may be a small amount of pleural fluid on the right. No evidence of pneumothorax. The bones appear unremarkable. Telemetry leads overlie the chest.  IMPRESSION: Worsening bilateral airspace opacities compared with examination earlier today. Findings could reflect progressive aspiration pneumonia, ARDS or pulmonary edema.   Electronically Signed   By: Richardean Sale M.D.   On: 05/07/2015 20:38    CBC  Recent Labs Lab 05/07/15 0212 05/08/15 0523  05/09/15  0347  WBC 12.2* 9.3 7.3  HGB 9.9* 9.5* 9.1*  HCT 31.4* 30.1* 28.6*  PLT 232 228 229  MCV 95.2 94.7 94.7  MCH 30.0 29.9 30.1  MCHC 31.5 31.6 31.8  RDW 13.7 13.9 13.9    Chemistries   Recent Labs Lab 05/07/15 0212 05/08/15 0523  NA 135 136  K 4.4 4.5  CL 107 107  CO2 20* 20*  GLUCOSE 164* 112*  BUN 39* 47*  CREATININE 2.93* 2.96*  CALCIUM 8.1* 7.7*   ------------------------------------------------------------------------------------------------------------------ estimated creatinine clearance is 26.3 mL/min (by C-G formula based on Cr of 2.96). ------------------------------------------------------------------------------------------------------------------ No results for input(s): HGBA1C in the last 72 hours. ------------------------------------------------------------------------------------------------------------------ No results for input(s): CHOL, HDL, LDLCALC, TRIG, CHOLHDL, LDLDIRECT in the last 72 hours. ------------------------------------------------------------------------------------------------------------------ No results for input(s): TSH, T4TOTAL, T3FREE, THYROIDAB in the last 72 hours.  Invalid input(s): FREET3 ------------------------------------------------------------------------------------------------------------------ No results for input(s): VITAMINB12, FOLATE, FERRITIN, TIBC, IRON, RETICCTPCT in the last 72 hours.  Coagulation profile No results for input(s): INR, PROTIME in the last 168 hours.  No results for input(s): DDIMER in the last 72 hours.  Cardiac Enzymes  Recent Labs Lab 05/07/15 0430 05/07/15 1015 05/07/15 1625  TROPONINI 0.34* 0.86* 0.87*   ------------------------------------------------------------------------------------------------------------------ Invalid input(s): POCBNP    Strother Everitt D.O. on 05/09/2015 at 9:45 AM  Between 7am to 7pm - Pager - [phone]  After 7pm go to [email or web address] - password  [email or web address]  And look for the night coverage person covering for me after hours  [facility] Office  [phone]

## 2015-05-09 NOTE — Progress Notes (Signed)
Assessed pt, no signs of respiratory distress noted. Pt appears to be comfortable eating breakfast and watching television. Pt is off of BIPAP prior to my assessment, pt HR 85, O2saturations are 95% on Room Air, and blood pressure is stable 132/28mmhg. RT will continue to monitor.

## 2015-05-09 NOTE — Progress Notes (Signed)
Speech Language Pathology Treatment: Dysphagia  Patient Details Name: Troy Young MRN: [id number] DOB: [birth year] Today's Date: 05/09/2015 Time: 1540-1600 SLP Time Calculation (min) (ACUTE ONLY): 20 min  Assessment / Plan / Recommendation Clinical Impression  Pt seen for diet tolerance assessment and pt education to aspiration precautions.  Intake listed as 50% with lung sounds decreased and afebrile status.  Pt reports "some coughing" today that is NOT associated with intake.  Note WBC WNL.  SLP observed pt consuming solids and water - timely swallow with clear voice throughout.  No indications of aspiration/dysphagia.    Reviewed need to use caution with intake secondary to aspiration episode.  Using teach back, pt able to verbalize precautions.    Set up assist needed for meal due to pt's blindness.  Pt reports swallow ability to be at normal level.  Will sign off as all education completed and pt tolerating diet.  Thanks for this consult.    HPI Other Pertinent Information: 64 yo male adm [facility] with CAP.  Pt PMH + for spontaneous pneumothorax, blindness, DM2,CKD stage 4.  Pt with episode last evening of coughing toward end of meal resulting in decrease in oxygen saturation and requirment of Bipap.  CXR showed persistent bilateral opacity.  Sister of pt reports a single episode of coughing at end of meal at home prior to admit.  Pt denies dysphagia symptoms.    Pertinent Vitals Pain Assessment: No/denies pain  SLP Plan  All goals met    Recommendations Postural Changes and/or Swallow Maneuvers: Seated upright 90 degrees;Upright 30-60 min after meal              Oral Care Recommendations: Oral care BID Follow up Recommendations: None Plan: All goals met    [place], [place] [facility] SLP [phone]

## 2015-05-10 LAB — GLUCOSE, CAPILLARY
GLUCOSE-CAPILLARY: 93 mg/dL (ref 65–99)
Glucose-Capillary: 93 mg/dL (ref 65–99)
Glucose-Capillary: 80 mg/dL (ref 65–99)

## 2015-05-10 LAB — BASIC METABOLIC PANEL
Anion gap: 9 (ref 5–15)
BUN: 41 mg/dL — ABNORMAL HIGH (ref 6–20)
CALCIUM: 8.3 mg/dL — AB (ref 8.9–10.3)
CO2: 22 mmol/L (ref 22–32)
Chloride: 109 mmol/L (ref 101–111)
Creatinine, Ser: 2.75 mg/dL — ABNORMAL HIGH (ref 0.61–1.24)
GFR calc non Af Amer: 23 mL/min — ABNORMAL LOW (ref >60)
GFR, EST AFRICAN AMERICAN: 26 mL/min — AB (ref >60)
Glucose, Bld: 78 mg/dL (ref 65–99)
Potassium: 4 mmol/L (ref 3.5–5.1)
SODIUM: 140 mmol/L (ref 135–145)

## 2015-05-10 LAB — CBC
HEMATOCRIT: 29.3 % — AB (ref 39.0–52.0)
Hemoglobin: 9.2 g/dL — ABNORMAL LOW (ref 13.0–17.0)
MCH: 29.5 pg (ref 26.0–34.0)
MCHC: 31.4 g/dL (ref 30.0–36.0)
MCV: 93.9 fL (ref 78.0–100.0)
Platelets: 236 10*3/uL (ref 150–400)
RBC: 3.12 MIL/uL — AB (ref 4.22–5.81)
RDW: 13.8 % (ref 11.5–15.5)
WBC: 8.3 10*3/uL (ref 4.0–10.5)

## 2015-05-10 MED ORDER — LEVOFLOXACIN 750 MG PO TABS: 750.0000 mg | ORAL_TABLET | ORAL | Status: DC

## 2015-05-10 NOTE — Progress Notes (Signed)
ANTIBIOTIC CONSULT NOTE - Follow Up  Pharmacy Consult for Levaquin Indication: CAP vs. Aspiration pneumonia  No Known Allergies  Patient Measurements: Height: 5\' 8"  (172.7 cm) Weight: 182 lb 3.2 oz (82.645 kg) IBW/kg (Calculated) : 68.4  Vital Signs: Temp: 98.7 F (37.1 C) (07/02 0524) Temp Source: Oral (07/02 0524) BP: 124/64 mmHg (07/02 0852) Pulse Rate: 88 (07/02 0852) Intake/Output from previous day: 07/01 0701 - 07/02 0700 In: 380 [P.O.:380] Out: 3000 [Urine:3000] Intake/Output from this shift:    Labs:  Recent Labs  05/08/15 0523 05/09/15 0347 05/09/15 0740 05/10/15 0459  WBC 9.3 7.3  --  8.3  HGB 9.5* 9.1*  --  9.2*  PLT 228 229  --  236  CREATININE 2.96*  --  2.95* 2.75*   Estimated Creatinine Clearance: 28.1 mL/min (by C-G formula based on Cr of 2.75). No results for input(s): VANCOTROUGH, VANCOPEAK, VANCORANDOM, GENTTROUGH, GENTPEAK, GENTRANDOM, TOBRATROUGH, TOBRAPEAK, TOBRARND, AMIKACINPEAK, AMIKACINTROU, AMIKACIN in the last 72 hours.   Microbiology: Recent Results (from the past 720 hour(s))  Blood culture (routine x 2)     Status: None (Preliminary result)   Collection Time: 05/07/15  2:52 AM  Result Value Ref Range Status   Specimen Description BLOOD RIGHT ANTECUBITAL  Final   Special Requests BOTTLES DRAWN AEROBIC AND ANAEROBIC 5CC  Final   Culture   Final    NO GROWTH 2 DAYS Performed at [facility]    Report Status PENDING  Incomplete  Blood culture (routine x 2)     Status: None (Preliminary result)   Collection Time: 05/07/15  2:52 AM  Result Value Ref Range Status   Specimen Description BLOOD RIGHT HAND  Final   Special Requests BOTTLES DRAWN AEROBIC AND ANAEROBIC 5CC  Final   Culture   Final    NO GROWTH 2 DAYS Performed at [facility]    Report Status PENDING  Incomplete  MRSA PCR Screening     Status: None   Collection Time: 05/09/15 11:31 AM  Result Value Ref Range Status   MRSA by PCR NEGATIVE NEGATIVE Final     Comment:        The GeneXpert MRSA Assay (FDA approved for NASAL specimens only), is one component of a comprehensive MRSA colonization surveillance program. It is not intended to diagnose MRSA infection nor to guide or monitor treatment for MRSA infections.     Medical History: Past Medical History  Diagnosis Date  . Legally blind   . DM2 (diabetes mellitus, type 2)   . Hypertension   . Hyperlipidemia   . BPH (benign prostatic hyperplasia)   . Cancer     colon cancer (malignant polyp) removed  . Spontaneous pneumothorax     2  . Renal insufficiency     Medications:  Scheduled:  . amLODipine  10 mg Oral Daily  . aspirin EC  81 mg Oral Daily  . furosemide  20 mg Intravenous Daily  . glipiZIDE  10 mg Oral Q breakfast  . heparin subcutaneous  5,000 Units Subcutaneous 3 times per day  . iron polysaccharides  150 mg Oral Daily  . levofloxacin  750 mg Oral Q48H  . linagliptin  5 mg Oral Daily  . losartan  100 mg Oral Daily  . metoprolol tartrate  25 mg Oral BID  . pravastatin  40 mg Oral QPM  . tamsulosin  0.8 mg Oral QPM   Infusions:   PRN: albuterol  Assessment: 39 yoM with PMH of DM, diabetic retinopathy,  HTN, HLD, BPH admitted with sepsis secondary to CAP, started on azithromycin and ceftriaxone. Following a likely aspiration event 6/29 PM, pharmacy consulted to switch antibiotics to Zosyn, Vancomycin, and Levaquin.   6/29 >> Ceftriaxone >> 6/29 6/29 >> Azithromycin >>  6/29 6/29 >> Vancomycin >> 7/1 6/29 >> Zosyn >> 7/1 6/29 >> Levaquin >>  Temp: afebrile since admit WBC: improved to wnl Renal: Stage IV CKD, CrCl 28 ml/min CG LA wnl  6/29 blood: NGTD 6/29 Strep ag: neg 6/29 Legionella ag: neg 6/29 HIV non-reactive  Goal of Therapy:  Eradication of infection Dose appropriate for indication and renal function  Plan:  Day 4 antibiotics  Continue Levaquin 750mg  PO q48h  Continue to follow renal function  F/u planned duration of  therapy  Peggyann Juba, PharmD, BCPS Pager: [phone] 05/10/2015,10:13 AM

## 2015-05-10 NOTE — Evaluation (Signed)
Physical Therapy Evaluation Patient Details Name: Troy Young MRN: [id number] DOB: [birth year] Today's Date: 05/10/2015   History of Present Illness  Troy Young is a 64 y.o. male with h/o CKD stage 4 and blindness secondary to DM2, follows with nephrology at [facility], removal of malignant colon polyp last year. Patient presents to the ED with c/o 3 day history of cough productive of this sputum, SOB worse with any exertion  Clinical Impression  Pt admitted with above diagnosis. Pt currently with functional limitations due to the deficits listed below (see PT Problem List).  Pt will benefit from skilled PT to increase their independence and safety with mobility to allow discharge to the venue listed below.  Pt  Doing well today, will continue to see for amb to strengthen and work on activity tolerance prior to D/C     Follow Up Recommendations No PT follow up    Equipment Recommendations  None recommended by PT    Recommendations for Other Services       Precautions / Restrictions Precautions Precautions: Fall Precaution Comments: pt is legally blind Restrictions Weight Bearing Restrictions: No      Mobility  Bed Mobility               General bed mobility comments: pt in chair for    Transfers Overall transfer level: Needs assistance   Transfers: Sit to/from Stand Sit to Stand: Min guard         General transfer comment: cues for chair location d/t decr vision  Ambulation/Gait   Ambulation Distance (Feet): 140 Feet Assistive device: 1 person hand held assist Gait Pattern/deviations: Step-through pattern     General Gait Details: pt holding therapist's arm for direction; LOB x 3 with min/guard assist to recover; pt feeling" unsteady" d/t not being up and also without his blindcane  Science writer    Modified Rankin (Stroke Patients Only)       Balance                                              Pertinent Vitals/Pain Pain Assessment: No/denies pain    Home Living Family/patient expects to be discharged to:: Private residence   Available Help at Discharge: Family           Home Equipment: Other (comment) (blind cane)      Prior Function Level of Independence: Independent;Independent with assistive device(s)         Comments: amb with blind cane I'ly     Hand Dominance        Extremity/Trunk Assessment   Upper Extremity Assessment: Overall WFL for tasks assessed           Lower Extremity Assessment: Overall WFL for tasks assessed         Communication   Communication: No difficulties  Cognition Arousal/Alertness: Awake/alert Behavior During Therapy: WFL for tasks assessed/performed Overall Cognitive Status: Within Functional Limits for tasks assessed                      General Comments      Exercises        Assessment/Plan    PT Assessment Patient needs continued PT services  PT Diagnosis Difficulty walking   PT Problem List Decreased strength;Decreased range of motion;Decreased balance;Decreased  activity tolerance;Decreased mobility  PT Treatment Interventions DME instruction;Gait training;Functional mobility training;Therapeutic activities;Therapeutic exercise;Patient/family education   PT Goals (Current goals can be found in the Care Plan section) Acute Rehab PT Goals Patient Stated Goal: home  PT Goal Formulation: With patient Time For Goal Achievement: 05/17/15 Potential to Achieve Goals: Good    Frequency Min 3X/week   Barriers to discharge        Co-evaluation               End of Session Equipment Utilized During Treatment: Gait belt Activity Tolerance: Patient tolerated treatment well Patient left: with call bell/phone within reach;in chair Nurse Communication: Mobility status         Time: 1220-1238 PT Time Calculation (min) (ACUTE ONLY): 18 min   Charges:   PT Evaluation $Initial PT  Evaluation Tier I: 1 Procedure     PT G CodesKenyon Young 05/15/15, 1:07 PM

## 2015-05-10 NOTE — Discharge Summary (Signed)
Physician Discharge Summary  Troy Young GXQ:[id number] DOB: 18-Jun-1951 DOA: 05/07/2015  PCP: No PCP Per Patient  Admit date: 05/07/2015 Discharge date: 05/10/2015  Time spent: 45 minutes  Recommendations for Outpatient Follow-up:  Patient will be discharged to home.  Patient will need to follow up with primary care provider within one week of discharge.  Patient will need to follow up with cardiology at the specified time.  Patient will also need follow up chest xray in 3-4 weeks for resolution of pneumonia.  Patient will also need Patient should continue medications as prescribed.  Patient should follow a heart healthy/carb modified diet.   Discharge Diagnoses:  Sepsis secondary to community acquired pneumonia Acute respiratory failure secondary to pneumonia versus pulmonary edema Elevated troponin Acute CHF exacerbation, diastolic Hypertension Diabetes mellitus, type II Hyperlipidemia Chronic kidney disease, stage IV  Discharge Condition: Stable  Diet recommendation: heart healthy/carb modified  Filed Weights   05/08/15 1115 05/09/15 0500 05/10/15 0524  Weight: 83.3 kg (183 lb 10.3 oz) 84 kg (185 lb 3 oz) 82.645 kg (182 lb 3.2 oz)    History of present illness:  on 05/07/2015 by Dr. Jennette Kettle Troy Young is a 64 y.o. male with h/o CKD stage 4 and blindness secondary to DM2, follows with nephrology at [facility], removal of malignant colon polyp last year. Patient presents to the ED with c/o 3 day history of cough productive of this sputum, SOB worse with any exertion. His SOB got so bad that he finally called EMS this morning and was noted to be satting 83% on room air. Albuterol neb given en route provided some relief. He has no chest pain at all, has chills and subjective fever. No recent hospital stays within the past 3 months.  Hospital Course:  Sepsis secondary to community-acquired pneumonia vs aspiration -Patient was tachycardic, tachypneic with leukocytosis  upon admission -Chest x-ray: Patchy bilateral lower lobe opacities, may reflect pneumonia, follow-up PA and lateral x-ray recommended in 3-4 weeks -Initially placed on azithromycin and Rocephin, switched to vanc/zosyn and levaquin for possible aspiration pna -Vanc and zosyn discontinued -Leukocytosis resolved -Blood cultures show no growth to date -Urine strep pneumonia and legionella antigens negative -Continue neb treatments as needed -Speech consulted (for possible aspiration) and recommended carb modified diet -Will continue levaquin   Acute respiratory failure secondary to pneumonia vs pulm edema -Upon arrival to the ER, oxygen saturations were 83% -Patient had acute respiratory distress the night of 6/30, and moved to step down. He was placed on Bipap. -Patient was successfully weaned off of bipap and nasal canula. Currently on room air and maintaining his oxygen saturations.  -Continue current treatment plan as stated above -CXR: worsening B/L airspace opacities, ? Progressive aspiration pna, ARDS, pulm edema -Continue lasix 20mg  PO dialy.  -PT consulted and patient did not require further PT  Elevated troponin -Peak Troponin 0.87 -No complaints of active chest pain -Possibly secondary to respiratory illness in the setting of CAD, stage IV -Cardiology consulted and appreciated, recommended outpatient ischemia workup/lexiscan myoview once patient has recovered from pneumonia -Echocardiogram: EF 14-48%, grade 2 diastolic dysfunction -Continue losartan, metoprolol, and aspirin  -Patient will need to follow up with Dr. Percival Spanish for further workup  Acute CHF exacerbation, diastolic -BNP 185.6 -Echo EF 31-49%, grade 2 diastolic dysfunction -Continue lasix 20mg  IV daily and monitor intake/output and daily weights -UOP over the past 24hrs 3000cc -Continue losartan, metoprolol, and aspirin  -Will discharge patient with lasix 20mg  PO daily  Hypertension -Continue amlodipine,  losartan,  metoprolol  Diabetes mellitus, type II -Continue glipizide, tradjenta, lantus  Hyperlipidemia -Continue statin  Chronic kidney disease, Stage IV -Patient follows with nephrologist at [place] to monitor BMP, pending Cr this morning  Procedures  Echocardiogram  Consults  Cardiology  Discharge Exam: Filed Vitals:   05/10/15 1319  BP: 121/75  Pulse: 73  Temp: 98.2 F (36.8 C)  Resp: 20   Exam  General: Well developed, well nourished, no distress  HEENT: NCAT, mucous membranes moist.   Cardiovascular: S1 S2 auscultated, RRR, no murmurs  Respiratory: Breath sounds improving.  No weehzing  Abdomen: Soft, nontender, nondistended, + bowel sounds  Extremities: warm dry without cyanosis clubbing or edema  Neuro: AAOx3, nonfocal  Psych: Normal affect and demeanor  Discharge Instructions      Discharge Instructions    Discharge instructions    Complete by:  As directed   Patient will be discharged to home.  Patient will need to follow up with primary care provider within one week of discharge.  Patient will need to follow up with cardiology at the specified time.  Patient will also need follow up chest xray in 3-4 weeks for resolution of pneumonia.  Patient will also need Patient should continue medications as prescribed.  Patient should follow a heart healthy/carb modified diet.            Medication List    TAKE these medications        amLODipine 10 MG tablet  Commonly known as:  NORVASC  Take 10 mg by mouth daily.     aspirin EC 81 MG tablet  Take 81 mg by mouth daily.     glipiZIDE 10 MG 24 hr tablet  Commonly known as:  GLUCOTROL XL  Take 10 mg by mouth daily with breakfast.     insulin glargine 100 UNIT/ML injection  Commonly known as:  LANTUS  Inject 8 Units into the skin at bedtime.     iron polysaccharides 150 MG capsule  Commonly known as:  NIFEREX  Take 150 mg by mouth daily.     levofloxacin  750 MG tablet  Commonly known as:  LEVAQUIN  Take 1 tablet (750 mg total) by mouth every other day.     losartan 100 MG tablet  Commonly known as:  COZAAR  Take 100 mg by mouth daily.     metoprolol tartrate 25 MG tablet  Commonly known as:  LOPRESSOR  Take 25 mg by mouth 2 (two) times daily.     pravastatin 40 MG tablet  Commonly known as:  PRAVACHOL  Take 40 mg by mouth every evening.     sitaGLIPtin 50 MG tablet  Commonly known as:  JANUVIA  Take 50 mg by mouth daily.     tamsulosin 0.4 MG Caps capsule  Commonly known as:  FLOMAX  Take 0.8 mg by mouth every evening.       No Known Allergies Follow-up Information    Follow up with HAGER, BRYAN, PA-C On 06/10/2015.   Specialties:  Physician Assistant, Radiology, Interventional Cardiology   Why:  @ 3:30 for post hospital establishment   Contact information:   [place] [id number] [phone]       Follow up with Primary care physician. Schedule an appointment as soon as possible for a visit in 1 week.   Why:  Hospital follow up       The results of significant diagnostics from this hospitalization (including imaging,  microbiology, ancillary and laboratory) are listed below for reference.    Significant Diagnostic Studies: Dg Chest 2 View (if Patient Has Fever And/or Copd)  05/07/2015   CLINICAL DATA:  Cough and increasing shortness of breath for 3 days. Chest discomfort.  EXAM: CHEST  2 VIEW  COMPARISON:  None.  FINDINGS: Bilateral patchy lower lobe consolidations. Heart is at the upper limits normal in size. There is vascular congestion, no frank pulmonary edema. No pleural effusion or pneumothorax. No acute osseous abnormalities are seen.  IMPRESSION: 1. Patchy bilateral lower lobe opacities. This may reflect pneumonia, however aspiration is considered given distribution. Followup PA and lateral chest X-ray is recommended in 3-4 weeks following trial of antibiotic therapy to ensure resolution  and exclude underlying malignancy. 2. Borderline cardiomegaly.  Mild vascular congestion.   Electronically Signed   By: Jeb Levering M.D.   On: 05/07/2015 03:05   Dg Chest Port 1 View  05/08/2015   CLINICAL DATA:  Dyspnea.  EXAM: PORTABLE CHEST - 1 VIEW  COMPARISON:  05/07/2015  FINDINGS: Patchy airspace opacities persist in both bases, partially cleared. No large effusion is evident. There is no pneumothorax appear  IMPRESSION: Persistent basilar opacities bilaterally with partial clearance.   Electronically Signed   By: Andreas Newport M.D.   On: 05/08/2015 06:37   Dg Chest Port 1 View  05/07/2015   CLINICAL DATA:  Extreme shortness of breath and coughing episode during eating today. History of hypertension and diabetes. Initial encounter.  EXAM: PORTABLE CHEST - 1 VIEW  COMPARISON:  05/07/2015.  FINDINGS: 2021 hours. The heart size and mediastinal contours are stable. Compared with the examination performed earlier today, there are worsening bilateral airspace opacities with poor definition of the pulmonary vasculature. There may be a small amount of pleural fluid on the right. No evidence of pneumothorax. The bones appear unremarkable. Telemetry leads overlie the chest.  IMPRESSION: Worsening bilateral airspace opacities compared with examination earlier today. Findings could reflect progressive aspiration pneumonia, ARDS or pulmonary edema.   Electronically Signed   By: Richardean Sale M.D.   On: 05/07/2015 20:38    Microbiology: Recent Results (from the past 240 hour(s))  Blood culture (routine x 2)     Status: None (Preliminary result)   Collection Time: 05/07/15  2:52 AM  Result Value Ref Range Status   Specimen Description BLOOD RIGHT ANTECUBITAL  Final   Special Requests BOTTLES DRAWN AEROBIC AND ANAEROBIC 5CC  Final   Culture   Final    NO GROWTH 3 DAYS Performed at [facility]    Report Status PENDING  Incomplete  Blood culture (routine x 2)     Status: None  (Preliminary result)   Collection Time: 05/07/15  2:52 AM  Result Value Ref Range Status   Specimen Description BLOOD RIGHT HAND  Final   Special Requests BOTTLES DRAWN AEROBIC AND ANAEROBIC 5CC  Final   Culture   Final    NO GROWTH 3 DAYS Performed at [facility]    Report Status PENDING  Incomplete  MRSA PCR Screening     Status: None   Collection Time: 05/09/15 11:31 AM  Result Value Ref Range Status   MRSA by PCR NEGATIVE NEGATIVE Final    Comment:        The GeneXpert MRSA Assay (FDA approved for NASAL specimens only), is one component of a comprehensive MRSA colonization surveillance program. It is not intended to diagnose MRSA infection nor to guide or monitor treatment for  MRSA infections.      Labs: Basic Metabolic Panel:  Recent Labs Lab 05/07/15 0212 05/08/15 0523 05/09/15 0740 05/10/15 0459  NA 135 136 137 140  K 4.4 4.5 4.3 4.0  CL 107 107 105 109  CO2 20* 20* 22 22  GLUCOSE 164* 112* 109* 78  BUN 39* 47* 42* 41*  CREATININE 2.93* 2.96* 2.95* 2.75*  CALCIUM 8.1* 7.7* 8.2* 8.3*   Liver Function Tests: No results for input(s): AST, ALT, ALKPHOS, BILITOT, PROT, ALBUMIN in the last 168 hours. No results for input(s): LIPASE, AMYLASE in the last 168 hours. No results for input(s): AMMONIA in the last 168 hours. CBC:  Recent Labs Lab 05/07/15 0212 05/08/15 0523 05/09/15 0347 05/10/15 0459  WBC 12.2* 9.3 7.3 8.3  HGB 9.9* 9.5* 9.1* 9.2*  HCT 31.4* 30.1* 28.6* 29.3*  MCV 95.2 94.7 94.7 93.9  PLT 232 228 229 236   Cardiac Enzymes:  Recent Labs Lab 05/07/15 0430 05/07/15 1015 05/07/15 1625  TROPONINI 0.34* 0.86* 0.87*   BNP: BNP (last 3 results)  Recent Labs  05/07/15 0212  BNP 303.6*    ProBNP (last 3 results) No results for input(s): PROBNP in the last 8760 hours.  CBG:  Recent Labs Lab 05/09/15 0748 05/09/15 1132 05/09/15 1632 05/09/15 2153 05/10/15 0729  GLUCAP 87 129* 119* 93 80        Signed:  Tierre Gerard  [facility] 05/10/2015, 1:35 PM

## 2015-05-10 NOTE — Progress Notes (Signed)
Consulting cardiologist: Dr. Minus Breeding  Seen for followup: Elevated troponin I, suspected CAD  Subjective:    No chest pain. Reports improved breathing, some cough.  Objective:   Temp:  [97.9 F (36.6 C)-98.9 F (37.2 C)] 98.7 F (37.1 C) (07/02 0524) Pulse Rate:  [73-91] 73 (07/02 0524) Resp:  [18-21] 18 (07/02 0524) BP: (125-135)/(57-67) 125/64 mmHg (07/02 0524) SpO2:  [93 %-97 %] 97 % (07/02 0524) Weight:  [182 lb 3.2 oz (82.645 kg)] 182 lb 3.2 oz (82.645 kg) (07/02 0524) Last BM Date: 05/09/15  Filed Weights   05/08/15 1115 05/09/15 0500 05/10/15 0524  Weight: 183 lb 10.3 oz (83.3 kg) 185 lb 3 oz (84 kg) 182 lb 3.2 oz (82.645 kg)    Intake/Output Summary (Last 24 hours) at 05/10/15 0840 Last data filed at 05/10/15 0620  Gross per 24 hour  Intake    380 ml  Output   3000 ml  Net  -2620 ml    Telemetry: Sinus rhythm.  Exam:  General: Appears comfortable.  Lungs: Course breath sounds.  Cardiac: RRR, no gallop.  Extremities: No edema.  Lab Results:  Basic Metabolic Panel:  Recent Labs Lab 05/08/15 0523 05/09/15 0740 05/10/15 0459  NA 136 137 140  K 4.5 4.3 4.0  CL 107 105 109  CO2 20* 22 22  GLUCOSE 112* 109* 78  BUN 47* 42* 41*  CREATININE 2.96* 2.95* 2.75*  CALCIUM 7.7* 8.2* 8.3*    CBC:  Recent Labs Lab 05/08/15 0523 05/09/15 0347 05/10/15 0459  WBC 9.3 7.3 8.3  HGB 9.5* 9.1* 9.2*  HCT 30.1* 28.6* 29.3*  MCV 94.7 94.7 93.9  PLT 228 229 236    Cardiac Enzymes:  Recent Labs Lab 05/07/15 0430 05/07/15 1015 05/07/15 1625  TROPONINI 0.34* 0.86* 0.87*    Echocardiogram  Study Conclusions  - Left ventricle: There is akinesis of the mid anterior and anteroseptal and apical anterior and septal walls. The cavity size was normal. There was mild concentric hypertrophy. Systolic function was normal. The estimated ejection fraction was in the range of 50% to 55%. Wall motion was normal; there were no regional  wall motion abnormalities. Features are consistent with a pseudonormal left ventricular filling pattern, with concomitant abnormal relaxation and increased filling pressure (grade 2 diastolic dysfunction). Doppler parameters are consistent with elevated ventricular end-diastolic filling pressure. - Aortic valve: Trileaflet; normal thickness leaflets. Transvalvular velocity was within the normal range. There was no stenosis. There was no regurgitation. - Aortic root: The aortic root was normal in size. - Mitral valve: Structurally normal valve. There was mild regurgitation. - Left atrium: The atrium was normal in size. - Right ventricle: The cavity size was normal. Wall thickness was normal. Systolic function was normal. - Tricuspid valve: There was moderate regurgitation. - Pulmonary arteries: Systolic pressure was severely increased. PA peak pressure: 61 mm Hg (S). - Inferior vena cava: The vessel was normal in size. - Pericardium, extracardiac: There was no pericardial effusion.  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 FIlling pressures are elevated. There is severe pulmonary hypertension.   Medications:   Scheduled Medications: . amLODipine  10 mg Oral Daily  . aspirin EC  81 mg Oral Daily  . furosemide  20 mg Intravenous Daily  . glipiZIDE  10 mg Oral Q breakfast  . heparin subcutaneous  5,000 Units Subcutaneous 3 times per  day  . iron polysaccharides  150 mg Oral Daily  . levofloxacin  750 mg Oral Q48H  . linagliptin  5 mg Oral Daily  . losartan  100 mg Oral Daily  . metoprolol tartrate  25 mg Oral BID  . pravastatin  40 mg Oral QPM  . tamsulosin  0.8 mg Oral QPM     PRN Medications:  albuterol   Assessment:   1. Elevated troponin I in the setting of pneumonia and recent sepsis. Suspected  underlying ischemic heart disease based on WMA by echocardiogram. No angina and ECG nonspecific.  2. Component and acute diastolic heart failure. Diuresing on IV Lasix.  3. Acute on chronic renal failure. CKD stage IV at baseline. Creatinine 2.7 (stable).   Plan/Discussion:    Continue ASA, Norvasc, Losartan, Lasix, metoprolol, pravastatin. Once recovered from acute illness, plan is to arrange followup with Dr. Percival Spanish and pursue outpatient ischemic workup via [facility].   Satira Sark, M.D., F.A.C.C.

## 2015-05-10 NOTE — Discharge Instructions (Signed)

## 2015-05-12 LAB — CULTURE, BLOOD (ROUTINE X 2)
Culture: NO GROWTH
Culture: NO GROWTH

## 2015-06-10 ENCOUNTER — Ambulatory Visit: Payer: Medicare HMO | Admitting: Physician Assistant

## 2015-06-10 ENCOUNTER — Emergency Department (HOSPITAL_COMMUNITY): Payer: Medicare HMO

## 2015-06-10 ENCOUNTER — Encounter (HOSPITAL_COMMUNITY): Payer: Self-pay | Admitting: Emergency Medicine

## 2015-06-10 ENCOUNTER — Inpatient Hospital Stay (HOSPITAL_COMMUNITY)
Admission: EM | Admit: 2015-06-10 | Discharge: 2015-06-14 | DRG: 683 | Disposition: A | Discharge status: Home / Self Care | Payer: Medicare HMO | Attending: Internal Medicine | Admitting: Internal Medicine

## 2015-06-10 DIAGNOSIS — I129 Hypertensive chronic kidney disease with stage 1 through stage 4 chronic kidney disease, or unspecified chronic kidney disease: Secondary | ICD-10-CM | POA: Diagnosis present

## 2015-06-10 DIAGNOSIS — E1122 Type 2 diabetes mellitus with diabetic chronic kidney disease: Secondary | ICD-10-CM | POA: Diagnosis present

## 2015-06-10 DIAGNOSIS — K529 Noninfective gastroenteritis and colitis, unspecified: Secondary | ICD-10-CM | POA: Diagnosis present

## 2015-06-10 DIAGNOSIS — H548 Legal blindness, as defined in USA: Secondary | ICD-10-CM | POA: Diagnosis present

## 2015-06-10 DIAGNOSIS — I5032 Chronic diastolic (congestive) heart failure: Secondary | ICD-10-CM | POA: Diagnosis present

## 2015-06-10 DIAGNOSIS — R0600 Dyspnea, unspecified: Secondary | ICD-10-CM | POA: Diagnosis present

## 2015-06-10 DIAGNOSIS — N179 Acute kidney failure, unspecified: Principal | ICD-10-CM | POA: Diagnosis present

## 2015-06-10 DIAGNOSIS — N289 Disorder of kidney and ureter, unspecified: Secondary | ICD-10-CM

## 2015-06-10 DIAGNOSIS — Z87891 Personal history of nicotine dependence: Secondary | ICD-10-CM | POA: Diagnosis not present

## 2015-06-10 DIAGNOSIS — R7989 Other specified abnormal findings of blood chemistry: Secondary | ICD-10-CM | POA: Diagnosis not present

## 2015-06-10 DIAGNOSIS — I509 Heart failure, unspecified: Secondary | ICD-10-CM

## 2015-06-10 DIAGNOSIS — R778 Other specified abnormalities of plasma proteins: Secondary | ICD-10-CM | POA: Diagnosis present

## 2015-06-10 DIAGNOSIS — E785 Hyperlipidemia, unspecified: Secondary | ICD-10-CM | POA: Diagnosis present

## 2015-06-10 DIAGNOSIS — D649 Anemia, unspecified: Secondary | ICD-10-CM | POA: Diagnosis present

## 2015-06-10 DIAGNOSIS — E119 Type 2 diabetes mellitus without complications: Secondary | ICD-10-CM

## 2015-06-10 DIAGNOSIS — Z85038 Personal history of other malignant neoplasm of large intestine: Secondary | ICD-10-CM

## 2015-06-10 DIAGNOSIS — E1139 Type 2 diabetes mellitus with other diabetic ophthalmic complication: Secondary | ICD-10-CM | POA: Diagnosis not present

## 2015-06-10 DIAGNOSIS — R0602 Shortness of breath: Secondary | ICD-10-CM | POA: Diagnosis not present

## 2015-06-10 DIAGNOSIS — E860 Dehydration: Secondary | ICD-10-CM | POA: Diagnosis present

## 2015-06-10 DIAGNOSIS — N184 Chronic kidney disease, stage 4 (severe): Secondary | ICD-10-CM | POA: Diagnosis present

## 2015-06-10 HISTORY — DX: Anemia, unspecified: D64.9

## 2015-06-10 LAB — BLOOD GAS, ARTERIAL
Acid-base deficit: 6.2 mmol/L — ABNORMAL HIGH (ref 0.0–2.0)
BICARBONATE: 17.2 meq/L — AB (ref 20.0–24.0)
Drawn by: 31814
FIO2: 0.28
O2 SAT: 90.6 %
PATIENT TEMPERATURE: 99.2
TCO2: 16.5 mmol/L (ref 0–100)
pCO2 arterial: 28.1 mmHg — ABNORMAL LOW (ref 35.0–45.0)
pH, Arterial: 7.406 (ref 7.350–7.450)
pO2, Arterial: 65.4 mmHg — ABNORMAL LOW (ref 80.0–100.0)

## 2015-06-10 LAB — BRAIN NATRIURETIC PEPTIDE: B Natriuretic Peptide: 810.7 pg/mL — ABNORMAL HIGH (ref 0.0–100.0)

## 2015-06-10 LAB — BASIC METABOLIC PANEL
Anion gap: 11 (ref 5–15)
BUN: 57 mg/dL — AB (ref 6–20)
CALCIUM: 8.8 mg/dL — AB (ref 8.9–10.3)
CHLORIDE: 107 mmol/L (ref 101–111)
CO2: 17 mmol/L — ABNORMAL LOW (ref 22–32)
Creatinine, Ser: 3.91 mg/dL — ABNORMAL HIGH (ref 0.61–1.24)
GFR calc Af Amer: 17 mL/min — ABNORMAL LOW (ref >60)
GFR, EST NON AFRICAN AMERICAN: 15 mL/min — AB (ref >60)
Glucose, Bld: 187 mg/dL — ABNORMAL HIGH (ref 65–99)
POTASSIUM: 5.1 mmol/L (ref 3.5–5.1)
Sodium: 135 mmol/L (ref 135–145)

## 2015-06-10 LAB — CBC
HEMATOCRIT: 26.2 % — AB (ref 39.0–52.0)
HEMOGLOBIN: 8.1 g/dL — AB (ref 13.0–17.0)
MCH: 28.9 pg (ref 26.0–34.0)
MCHC: 30.9 g/dL (ref 30.0–36.0)
MCV: 93.6 fL (ref 78.0–100.0)
Platelets: 255 10*3/uL (ref 150–400)
RBC: 2.8 MIL/uL — ABNORMAL LOW (ref 4.22–5.81)
RDW: 14.7 % (ref 11.5–15.5)
WBC: 10.4 10*3/uL (ref 4.0–10.5)

## 2015-06-10 LAB — I-STAT TROPONIN, ED: TROPONIN I, POC: 1.22 ng/mL — AB (ref 0.00–0.08)

## 2015-06-10 LAB — TROPONIN I: TROPONIN I: 0.94 ng/mL — AB (ref <0.031)

## 2015-06-10 MED ORDER — ASPIRIN 81 MG PO CHEW
324.0000 mg | CHEWABLE_TABLET | Freq: Once | ORAL | Status: AC
  Administered 2015-06-10: 324 mg via ORAL
  Filled 2015-06-10: qty 4

## 2015-06-10 MED ORDER — IPRATROPIUM-ALBUTEROL 0.5-2.5 (3) MG/3ML IN SOLN
3.0000 mL | Freq: Once | RESPIRATORY_TRACT | Status: AC
  Administered 2015-06-10: 3 mL via RESPIRATORY_TRACT
  Filled 2015-06-10: qty 3

## 2015-06-10 MED ORDER — FUROSEMIDE 10 MG/ML IJ SOLN
40.0000 mg | Freq: Once | INTRAMUSCULAR | Status: AC
  Administered 2015-06-10: 40 mg via INTRAVENOUS
  Filled 2015-06-10: qty 4

## 2015-06-10 NOTE — ED Notes (Signed)
[facility] called for transport. 

## 2015-06-10 NOTE — ED Notes (Signed)
Pt complaint of worsening SOB since Sunday; denies cough or other pain; recent hx of pneumonia a month ago.

## 2015-06-10 NOTE — H&P (Signed)
Nayel Purdy is an 64 y.o. male.    Dr. Harlene Ramus (pcp, [facility])  Chief Complaint: dyspnea HPI: 64 yo male with dm2, htn, hyperlipidemia apparently c/o dyspnea for the past several days.  Pt has increased his po intake of water over the past week.  Pt notes slight orthopnea, pnd.  Pt denies edema, wt gain, fever, chills, cough, cp, palp, n/v, diarrhea, brbpr, black stool.  + nausea and decrease in appetite.  Pt was brought to ED and found to have chf, and + trop, and mild ARF on CRF.  Past Medical History  Diagnosis Date  . Legally blind   . DM2 (diabetes mellitus, type 2)   . Hypertension   . Hyperlipidemia   . BPH (benign prostatic hyperplasia)   . Cancer     colon cancer (malignant polyp) removed  . Spontaneous pneumothorax     2  . Renal insufficiency   . Anemia     Past Surgical History  Procedure Laterality Date  . Polyp removed    . Eye surgery    . Cataract extraction      Family History  Problem Relation Age of Onset  . Cancer Father   . Ulcers Mother    Social History:  reports that he has quit smoking. His smoking use included Cigarettes. He has a 20 pack-year smoking history. He does not have any smokeless tobacco history on file. He reports that he does not drink alcohol. His drug history is not on file.  Allergies: No Known Allergies Medications reviewed   Results for orders placed or performed during the hospital encounter of 06/10/15 (from the past 48 hour(s))  CBC     Status: Abnormal   Collection Time: 06/10/15  4:50 PM  Result Value Ref Range   WBC 10.4 4.0 - 10.5 K/uL   RBC 2.80 (L) 4.22 - 5.81 MIL/uL   Hemoglobin 8.1 (L) 13.0 - 17.0 g/dL   HCT 26.2 (L) 39.0 - 52.0 %   MCV 93.6 78.0 - 100.0 fL   MCH 28.9 26.0 - 34.0 pg   MCHC 30.9 30.0 - 36.0 g/dL   RDW 14.7 11.5 - 15.5 %   Platelets 255 150 - 400 K/uL  Basic metabolic panel     Status: Abnormal   Collection Time: 06/10/15  4:50 PM  Result Value Ref Range   Sodium 135 135 - 145 mmol/L   Potassium 5.1 3.5 - 5.1 mmol/L   Chloride 107 101 - 111 mmol/L   CO2 17 (L) 22 - 32 mmol/L   Glucose, Bld 187 (H) 65 - 99 mg/dL   BUN 57 (H) 6 - 20 mg/dL   Creatinine, Ser 3.91 (H) 0.61 - 1.24 mg/dL   Calcium 8.8 (L) 8.9 - 10.3 mg/dL   GFR calc non Af Amer 15 (L) >60 mL/min   GFR calc Af Amer 17 (L) >60 mL/min    Comment: (NOTE) The eGFR has been calculated using the CKD EPI equation. This calculation has not been validated in all clinical situations. eGFR's persistently <60 mL/min signify possible Chronic Kidney Disease.    Anion gap 11 5 - 15  Brain natriuretic peptide     Status: Abnormal   Collection Time: 06/10/15  5:11 PM  Result Value Ref Range   B Natriuretic Peptide 810.7 (H) 0.0 - 100.0 pg/mL  I-Stat Troponin, ED (not at [facility])     Status: Abnormal   Collection Time: 06/10/15  6:38 PM  Result Value Ref Range   Troponin  i, poc 1.22 (HH) 0.00 - 0.08 ng/mL   Comment NOTIFIED PHYSICIAN    Comment 3            Comment: Due to the release kinetics of cTnI, a negative result within the first hours of the onset of symptoms does not rule out myocardial infarction with certainty. If myocardial infarction is still suspected, repeat the test at appropriate intervals.   Troponin I     Status: Abnormal   Collection Time: 06/10/15  7:10 PM  Result Value Ref Range   Troponin I 0.94 (HH) <0.031 ng/mL    Comment:        POSSIBLE MYOCARDIAL ISCHEMIA. SERIAL TESTING RECOMMENDED. CRITICAL RESULT CALLED TO, READ BACK BY AND VERIFIED WITH: L.ADKINS AT 1949 ON 8.2.16 BY W.BUCHHOLZ.    Dg Chest 2 View  06/10/2015   CLINICAL DATA:  Progressive shortness of breath for 3 days  EXAM: CHEST  2 VIEW  COMPARISON:  May 08, 2015  FINDINGS: There are small pleural effusions with trace interstitial edema. Heart is upper normal in size with slight pulmonary venous hypertension. No appreciable airspace consolidation. No adenopathy. No bone lesions.  IMPRESSION: Findings felt to be indicative of a  degree of congestive heart failure. No airspace consolidation.   Electronically Signed   By: Lowella Grip III M.D.   On: 06/10/2015 16:16    Review of Systems  Constitutional: Negative.   HENT: Negative.   Eyes: Negative.   Respiratory: Positive for shortness of breath. Negative for cough, hemoptysis, sputum production and wheezing.   Cardiovascular: Positive for orthopnea. Negative for chest pain, palpitations, claudication, leg swelling and PND.  Gastrointestinal: Negative.   Genitourinary: Negative.   Musculoskeletal: Negative.   Skin: Negative.   Neurological: Negative.   Endo/Heme/Allergies: Negative.   Psychiatric/Behavioral: Negative.     Blood pressure 106/61, pulse 89, temperature 99.2 F (37.3 C), temperature source Oral, resp. rate 25, weight 81.647 kg (180 lb), SpO2 90 %. Physical Exam  Constitutional: He is oriented to person, place, and time. He appears well-developed and well-nourished.  HENT:  Mouth/Throat: No oropharyngeal exudate.  Eyes: Conjunctivae and EOM are normal. Pupils are equal, round, and reactive to light. No scleral icterus.  Neck: Normal range of motion. Neck supple. JVD present. No tracheal deviation present. No thyromegaly present.  Cardiovascular: Normal rate and regular rhythm.  Exam reveals no gallop and no friction rub.   No murmur heard. Respiratory: Effort normal. No respiratory distress. He has no wheezes. He has rales. He exhibits no tenderness.  GI: Soft. Bowel sounds are normal. He exhibits no distension. There is no tenderness. There is no rebound and no guarding.  Musculoskeletal: Normal range of motion. He exhibits no edema or tenderness.  Lymphadenopathy:    He has no cervical adenopathy.  Neurological: He is alert and oriented to person, place, and time. He has normal reflexes. He displays normal reflexes. No cranial nerve deficit. He exhibits normal muscle tone. Coordination normal.  Skin: Skin is warm and dry. No rash noted. No  erythema. No pallor.  Psychiatric: He has a normal mood and affect. His behavior is normal. Judgment and thought content normal.     Assessment/Plan Dyspnea secondary to CHF  Check trop i q6hx 3 Check cardiac echo Please consult cardiology in the am.   ARF Check urine sodium, urine creatinine, urine eosinophils Check renal ultrasound Check cmp in am Check spep, upep Please consult nephrology in am  Anemia Check iron studies, b12, folate, esr, spep,  upep  Dm2 fsbs qc and qhs, iss  DVt prophylaxis:  SCD    Jani Gravel 06/10/2015, 9:45 PM

## 2015-06-10 NOTE — ED Provider Notes (Signed)
CSN: [id number]     Arrival date & time 06/10/15  1535 History   First MD Initiated Contact with Patient 06/10/15 1746     Chief Complaint  Patient presents with  . Shortness of Breath     (Consider location/radiation/quality/duration/timing/severity/associated sxs/prior Treatment) HPI Freddi Schrager is a 64 y.o. male with hx of DM, HTN, colon ca, stage 4 renal disease, presents to ED with complaint of shortness of breath. Patient states his symptoms started 3 days ago. He states symptoms are worse with exertion and when lying down flat. He denies any cough or congestion. He states he was admitted for a month ago with similar complaints and was diagnosed with pneumonia. He stayed in the hospital and was discharged. He states he did feel better after his discharge. He denies any fever or chills. Denies any chest pain. No back pain. Denies any pain or swelling in his legs. Pt has been taking neb tx at home with some relief. Today he only took inhaler which did not help  Past Medical History  Diagnosis Date  . Legally blind   . DM2 (diabetes mellitus, type 2)   . Hypertension   . Hyperlipidemia   . BPH (benign prostatic hyperplasia)   . Cancer     colon cancer (malignant polyp) removed  . Spontaneous pneumothorax     2  . Renal insufficiency    Past Surgical History  Procedure Laterality Date  . Polyp removed     Family History  Problem Relation Age of Onset  . Cancer Father    History  Substance Use Topics  . Smoking status: Former Smoker -- 1.00 packs/day for 20 years    Types: Cigarettes  . Smokeless tobacco: Not on file     Comment: Quit early 38s  . Alcohol Use: No    Review of Systems  Constitutional: Negative for fever and chills.  Respiratory: Positive for shortness of breath. Negative for cough, chest tightness, wheezing and stridor.   Cardiovascular: Negative for chest pain, palpitations and leg swelling.  Gastrointestinal: Negative for nausea, vomiting, abdominal  pain, diarrhea and abdominal distention.  Genitourinary: Negative for dysuria, urgency, frequency and hematuria.  Musculoskeletal: Negative for myalgias, arthralgias, neck pain and neck stiffness.  Skin: Negative for rash.  Allergic/Immunologic: Negative for immunocompromised state.  Neurological: Negative for dizziness, weakness, light-headedness, numbness and headaches.  All other systems reviewed and are negative.     Allergies  Review of patient's allergies indicates no known allergies.  Home Medications   Prior to Admission medications   Medication Sig Start Date End Date Taking? Authorizing Provider  amLODipine (NORVASC) 10 MG tablet Take 10 mg by mouth daily.   Yes Historical Provider, MD  aspirin EC 81 MG tablet Take 81 mg by mouth daily.   Yes Historical Provider, MD  glipiZIDE (GLUCOTROL XL) 10 MG 24 hr tablet Take 10 mg by mouth daily with breakfast.   Yes Historical Provider, MD  insulin glargine (LANTUS) 100 UNIT/ML injection Inject 8 Units into the skin at bedtime.   Yes Historical Provider, MD  iron polysaccharides (NIFEREX) 150 MG capsule Take 150 mg by mouth daily. 05/05/15  Yes Historical Provider, MD  losartan (COZAAR) 100 MG tablet Take 100 mg by mouth daily.   Yes Historical Provider, MD  metoprolol tartrate (LOPRESSOR) 25 MG tablet Take 25 mg by mouth 2 (two) times daily.   Yes Historical Provider, MD  pravastatin (PRAVACHOL) 40 MG tablet Take 40 mg by mouth every evening.  Yes Historical Provider, MD  sitaGLIPtin (JANUVIA) 50 MG tablet Take 50 mg by mouth daily.   Yes Historical Provider, MD  tamsulosin (FLOMAX) 0.4 MG CAPS capsule Take 0.8 mg by mouth every evening.   Yes Historical Provider, MD  vitamin C (ASCORBIC ACID) 250 MG tablet Take 250 mg by mouth daily.   Yes Historical Provider, MD  levofloxacin (LEVAQUIN) 750 MG tablet Take 1 tablet (750 mg total) by mouth every other day. Patient not taking: Reported on 06/10/2015 05/10/15   Maryann Mikhail, DO   BP  115/55 mmHg  Pulse 95  Temp(Src) 99.2 F (37.3 C) (Oral)  Resp 24  Wt 180 lb (81.647 kg)  SpO2 94% Physical Exam  Constitutional: He is oriented to person, place, and time. He appears well-developed and well-nourished. No distress.  HENT:  Head: Normocephalic and atraumatic.  Eyes: Conjunctivae are normal.  Neck: Neck supple.  Cardiovascular: Normal rate, regular rhythm and normal heart sounds.   Pulmonary/Chest: Effort normal and breath sounds normal. No respiratory distress. He has no wheezes. He has no rales.  Abdominal: Soft. Bowel sounds are normal. He exhibits no distension. There is no tenderness. There is no rebound.  Musculoskeletal: He exhibits no edema.  Neurological: He is alert and oriented to person, place, and time.  Skin: Skin is warm and dry.  Nursing note and vitals reviewed.   ED Course  Procedures (including critical care time) Labs Review Labs Reviewed  CBC - Abnormal; Notable for the following:    RBC 2.80 (*)    Hemoglobin 8.1 (*)    HCT 26.2 (*)    All other components within normal limits  BASIC METABOLIC PANEL - Abnormal; Notable for the following:    CO2 17 (*)    Glucose, Bld 187 (*)    BUN 57 (*)    Creatinine, Ser 3.91 (*)    Calcium 8.8 (*)    GFR calc non Af Amer 15 (*)    GFR calc Af Amer 17 (*)    All other components within normal limits  BRAIN NATRIURETIC PEPTIDE  I-STAT TROPOININ, ED    Imaging Review Dg Chest 2 View  06/10/2015   CLINICAL DATA:  Progressive shortness of breath for 3 days  EXAM: CHEST  2 VIEW  COMPARISON:  May 08, 2015  FINDINGS: There are small pleural effusions with trace interstitial edema. Heart is upper normal in size with slight pulmonary venous hypertension. No appreciable airspace consolidation. No adenopathy. No bone lesions.  IMPRESSION: Findings felt to be indicative of a degree of congestive heart failure. No airspace consolidation.   Electronically Signed   By: Lowella Grip III M.D.   On: 06/10/2015  16:16     EKG Interpretation None      MDM   Final diagnoses:  Shortness of breath  Renal insufficiency  Anemia, unspecified anemia type   Pt with worsening SOB, was admitted 1 month ago for CAP. At this time, no cough, no chest pain, no fever. Will get labs, Duoneb ordered. Will monitor.   Patient feels slightly better after the neb treatment. His chest x-ray showed small pleural effusions with trace interstitial edema, worrisome for congestive heart failure. Recent echo which was done one month ago showed akinesis in the mid anterior and anterior septal and apical anterior and septal walls suggestive of an infarct. His post a follow-up with cardiology today but missed his appointment. We'll try Lasix. Will monitor closely.   Contiguous to be elevated. Discussed with cardiology,  who is more concerned about CHF symptoms, shortness of breath, worsening chest x-ray. His BNP has tripled since a month ago.  Patient also noted to have worsening creatinine, 3.91, of baseline of 2.9. CO2 of 17.Unsure of exact cause for this. I discussed with [facility] hospitalist who will admit patient. At this time patient's vital signs remained stable. He states he feels better, and no respiratory distress.  Filed Vitals:   06/10/15 1923 06/10/15 2254 06/10/15 2310 06/10/15 2311  BP: 106/61  107/68 107/68  Pulse: 89  86 86  Temp:    99.2 F (37.3 C)  TempSrc:      Resp: 25  21 18   Weight:      SpO2: 90% 98% 97% 97%      Jeannett Senior, PA-C 06/11/15 [place], DO 06/11/15 2110

## 2015-06-10 NOTE — ED Notes (Signed)
Marice Reed is pt's niece/caregiver. She would like all updates [phone]

## 2015-06-10 NOTE — ED Notes (Signed)
Troy Young EDP made aware of patient i-stat troponin result.

## 2015-06-10 NOTE — ED Notes (Signed)
Kim with [facility] was notified of the ordered ABG

## 2015-06-10 NOTE — Progress Notes (Signed)
EDCM spoke to patient and wife at bedside.  Patient confirms his pcp is Dr. Loni Beckwith in [place].  System updated.

## 2015-06-11 ENCOUNTER — Encounter (HOSPITAL_COMMUNITY): Payer: Self-pay

## 2015-06-11 ENCOUNTER — Ambulatory Visit (HOSPITAL_BASED_OUTPATIENT_CLINIC_OR_DEPARTMENT_OTHER): Payer: Medicare HMO

## 2015-06-11 ENCOUNTER — Inpatient Hospital Stay (HOSPITAL_COMMUNITY): Payer: Medicare HMO

## 2015-06-11 DIAGNOSIS — R0602 Shortness of breath: Secondary | ICD-10-CM

## 2015-06-11 DIAGNOSIS — N184 Chronic kidney disease, stage 4 (severe): Secondary | ICD-10-CM

## 2015-06-11 DIAGNOSIS — E1122 Type 2 diabetes mellitus with diabetic chronic kidney disease: Secondary | ICD-10-CM

## 2015-06-11 DIAGNOSIS — R7989 Other specified abnormal findings of blood chemistry: Secondary | ICD-10-CM

## 2015-06-11 LAB — GLUCOSE, CAPILLARY
GLUCOSE-CAPILLARY: 100 mg/dL — AB (ref 65–99)
GLUCOSE-CAPILLARY: 136 mg/dL — AB (ref 65–99)
GLUCOSE-CAPILLARY: 145 mg/dL — AB (ref 65–99)
Glucose-Capillary: 98 mg/dL (ref 65–99)
Glucose-Capillary: 76 mg/dL (ref 65–99)

## 2015-06-11 LAB — HEMOGLOBIN AND HEMATOCRIT, BLOOD
HCT: 31.2 % — ABNORMAL LOW (ref 39.0–52.0)
Hemoglobin: 10 g/dL — ABNORMAL LOW (ref 13.0–17.0)

## 2015-06-11 LAB — T4, FREE: Free T4: 1.17 ng/dL — ABNORMAL HIGH (ref 0.61–1.12)

## 2015-06-11 LAB — COMPREHENSIVE METABOLIC PANEL
ALBUMIN: 2.6 g/dL — AB (ref 3.5–5.0)
ALT: 17 U/L (ref 17–63)
ANION GAP: 11 (ref 5–15)
AST: 17 U/L (ref 15–41)
Alkaline Phosphatase: 83 U/L (ref 38–126)
BUN: 55 mg/dL — AB (ref 6–20)
CO2: 17 mmol/L — ABNORMAL LOW (ref 22–32)
Calcium: 8.5 mg/dL — ABNORMAL LOW (ref 8.9–10.3)
Chloride: 106 mmol/L (ref 101–111)
Creatinine, Ser: 4.17 mg/dL — ABNORMAL HIGH (ref 0.61–1.24)
GFR calc Af Amer: 16 mL/min — ABNORMAL LOW (ref >60)
GFR calc non Af Amer: 14 mL/min — ABNORMAL LOW (ref >60)
Glucose, Bld: 101 mg/dL — ABNORMAL HIGH (ref 65–99)
POTASSIUM: 4.8 mmol/L (ref 3.5–5.1)
Sodium: 134 mmol/L — ABNORMAL LOW (ref 135–145)
Total Bilirubin: 0.7 mg/dL (ref 0.3–1.2)
Total Protein: 7.3 g/dL (ref 6.5–8.1)

## 2015-06-11 LAB — TSH: TSH: 5.247 u[IU]/mL — ABNORMAL HIGH (ref 0.350–4.500)

## 2015-06-11 LAB — CBC
HCT: 20.5 % — ABNORMAL LOW (ref 39.0–52.0)
Hemoglobin: 6.7 g/dL — CL (ref 13.0–17.0)
MCH: 29.5 pg (ref 26.0–34.0)
MCHC: 32.7 g/dL (ref 30.0–36.0)
MCV: 90.3 fL (ref 78.0–100.0)
PLATELETS: ADEQUATE 10*3/uL (ref 150–400)
RBC: 2.27 MIL/uL — ABNORMAL LOW (ref 4.22–5.81)
RDW: 14.8 % (ref 11.5–15.5)
WBC: 9.1 10*3/uL (ref 4.0–10.5)

## 2015-06-11 LAB — MRSA PCR SCREENING: MRSA BY PCR: NEGATIVE

## 2015-06-11 LAB — CK TOTAL AND CKMB (NOT AT ARMC)
CK, MB: 6.2 ng/mL — AB (ref 0.5–5.0)
RELATIVE INDEX: 3 — AB (ref 0.0–2.5)
Total CK: 205 U/L (ref 49–397)

## 2015-06-11 LAB — VITAMIN B12: Vitamin B-12: 580 pg/mL (ref 180–914)

## 2015-06-11 LAB — TROPONIN I
TROPONIN I: 0.82 ng/mL — AB (ref <0.031)
TROPONIN I: 0.89 ng/mL — AB (ref <0.031)
Troponin I: 0.85 ng/mL (ref <0.031)

## 2015-06-11 LAB — SEDIMENTATION RATE: Sed Rate: 132 mm/hr — ABNORMAL HIGH (ref 0–16)

## 2015-06-11 LAB — FERRITIN: Ferritin: 136 ng/mL (ref 24–336)

## 2015-06-11 LAB — ABO/RH: ABO/RH(D): O POS

## 2015-06-11 LAB — PREPARE RBC (CROSSMATCH)

## 2015-06-11 MED ORDER — INSULIN ASPART 100 UNIT/ML ~~LOC~~ SOLN
0.0000 [IU] | Freq: Three times a day (TID) | SUBCUTANEOUS | Status: DC
  Administered 2015-06-11: 1 [IU] via SUBCUTANEOUS

## 2015-06-11 MED ORDER — SODIUM CHLORIDE 0.9 % IV SOLN
INTRAVENOUS | Status: AC
  Administered 2015-06-11: 15:00:00 via INTRAVENOUS

## 2015-06-11 MED ORDER — SODIUM CHLORIDE 0.9 % IJ SOLN
3.0000 mL | Freq: Two times a day (BID) | INTRAMUSCULAR | Status: DC
  Administered 2015-06-11 – 2015-06-12 (×3): 3 mL via INTRAVENOUS

## 2015-06-11 MED ORDER — GLIPIZIDE ER 10 MG PO TB24
10.0000 mg | ORAL_TABLET | Freq: Every day | ORAL | Status: DC
  Administered 2015-06-11 – 2015-06-14 (×4): 10 mg via ORAL
  Filled 2015-06-11 (×5): qty 1

## 2015-06-11 MED ORDER — ACETAMINOPHEN 325 MG PO TABS: 650.0000 mg | ORAL_TABLET | Freq: Four times a day (QID) | ORAL | Status: DC | PRN

## 2015-06-11 MED ORDER — SODIUM CHLORIDE 0.9 % IV SOLN
Freq: Once | INTRAVENOUS | Status: AC
  Administered 2015-06-11: 03:00:00 via INTRAVENOUS

## 2015-06-11 MED ORDER — ONDANSETRON HCL 4 MG/2ML IJ SOLN: 4.0000 mg | Freq: Three times a day (TID) | INTRAMUSCULAR | Status: AC | PRN

## 2015-06-11 MED ORDER — FUROSEMIDE 10 MG/ML IJ SOLN
40.0000 mg | Freq: Once | INTRAMUSCULAR | Status: AC
  Administered 2015-06-11: 40 mg via INTRAVENOUS
  Filled 2015-06-11: qty 4

## 2015-06-11 MED ORDER — SODIUM CHLORIDE 0.9 % IJ SOLN: 3.0000 mL | INTRAMUSCULAR | Status: DC | PRN

## 2015-06-11 MED ORDER — ASPIRIN EC 81 MG PO TBEC
81.0000 mg | DELAYED_RELEASE_TABLET | Freq: Every day | ORAL | Status: DC
  Administered 2015-06-11 – 2015-06-14 (×4): 81 mg via ORAL
  Filled 2015-06-11 (×4): qty 1

## 2015-06-11 MED ORDER — INSULIN ASPART 100 UNIT/ML ~~LOC~~ SOLN
0.0000 [IU] | Freq: Every day | SUBCUTANEOUS | Status: DC
Start: 2015-06-11 — End: 2015-06-14

## 2015-06-11 MED ORDER — SODIUM CHLORIDE 0.9 % IJ SOLN
3.0000 mL | Freq: Two times a day (BID) | INTRAMUSCULAR | Status: DC
  Administered 2015-06-11 – 2015-06-14 (×5): 3 mL via INTRAVENOUS

## 2015-06-11 MED ORDER — POLYSACCHARIDE IRON COMPLEX 150 MG PO CAPS
150.0000 mg | ORAL_CAPSULE | Freq: Every day | ORAL | Status: DC
  Administered 2015-06-11 – 2015-06-14 (×4): 150 mg via ORAL
  Filled 2015-06-11 (×4): qty 1

## 2015-06-11 MED ORDER — INSULIN GLARGINE 100 UNIT/ML ~~LOC~~ SOLN
8.0000 [IU] | Freq: Every day | SUBCUTANEOUS | Status: DC
  Administered 2015-06-11 (×2): 8 [IU] via SUBCUTANEOUS
  Filled 2015-06-11 (×5): qty 0.08

## 2015-06-11 MED ORDER — TAMSULOSIN HCL 0.4 MG PO CAPS
0.8000 mg | ORAL_CAPSULE | Freq: Every evening | ORAL | Status: DC
  Administered 2015-06-11 – 2015-06-13 (×3): 0.8 mg via ORAL
  Filled 2015-06-11 (×4): qty 2

## 2015-06-11 MED ORDER — ALBUTEROL SULFATE (2.5 MG/3ML) 0.083% IN NEBU: 5.0000 mg | INHALATION_SOLUTION | RESPIRATORY_TRACT | Status: AC | PRN

## 2015-06-11 MED ORDER — ACETAMINOPHEN 650 MG RE SUPP: 650.0000 mg | Freq: Four times a day (QID) | RECTAL | Status: DC | PRN

## 2015-06-11 MED ORDER — AMLODIPINE BESYLATE 10 MG PO TABS
10.0000 mg | ORAL_TABLET | Freq: Every day | ORAL | Status: DC
  Administered 2015-06-11 – 2015-06-14 (×4): 10 mg via ORAL
  Filled 2015-06-11 (×4): qty 1

## 2015-06-11 MED ORDER — SODIUM CHLORIDE 0.9 % IV SOLN
250.0000 mL | INTRAVENOUS | Status: DC | PRN
  Administered 2015-06-11: 250 mL via INTRAVENOUS

## 2015-06-11 MED ORDER — METOPROLOL TARTRATE 25 MG PO TABS
25.0000 mg | ORAL_TABLET | Freq: Two times a day (BID) | ORAL | Status: DC
  Administered 2015-06-11 – 2015-06-14 (×7): 25 mg via ORAL
  Filled 2015-06-11 (×9): qty 1

## 2015-06-11 MED ORDER — LINAGLIPTIN 5 MG PO TABS
5.0000 mg | ORAL_TABLET | Freq: Every day | ORAL | Status: DC
  Administered 2015-06-11 – 2015-06-14 (×4): 5 mg via ORAL
  Filled 2015-06-11 (×4): qty 1

## 2015-06-11 MED ORDER — PRAVASTATIN SODIUM 40 MG PO TABS
40.0000 mg | ORAL_TABLET | Freq: Every evening | ORAL | Status: DC
  Administered 2015-06-11 – 2015-06-13 (×3): 40 mg via ORAL
  Filled 2015-06-11 (×4): qty 1

## 2015-06-11 NOTE — Progress Notes (Signed)
CRITICAL VALUE ALERT  Critical value received:  Troponin 0.85  Date of notification:  06/11/2015  Time of notification:  0300  Critical value read back:Yes.    Nurse who received alert:  Brien Mates RN  MD notified (1st page):  Forrest Moron NP  Time of first page:  0300  MD notified (2nd page):  Time of second page:  Responding MD:  Forrest Moron NP  Time MD responded:  [phone]

## 2015-06-11 NOTE — Progress Notes (Signed)
Received patient from [facility] via [facility].  Patient reported no pain and was not exhibiting SOB or increased WOB.  He was alert and oriented x4.  Afebrile. BP, HR, and RR within normal limits. MD was called upon arrival.  Will continue to monitor.

## 2015-06-11 NOTE — ED Notes (Signed)
Report called to [place] at [facility]

## 2015-06-11 NOTE — Clinical Documentation Improvement (Signed)
Please identify the stage of CKD if known and document in your future progress notes and discharge summary.  Current documentation reflects mild ARF on CRF.    Marland KitchenPossible CKD stages --Chronic kidney disease, stage 1- GFR > OR = 90 --Chronic kidney disease, stage 2 (mild) - GFR 60-89 --Chronic kidney disease, stage 3 (moderate) - GFR 30-59 --Chronic kidney disease, stage 4 (severe) - GFR 15-29 --Chronic kidney disease, stage 5- GFR < 15 --End-stage renal disease (ESRD) . Document any underlying cause of CKD such as Diabetes or Hypertension  Current renal labs: Component      BUN Creatinine  Latest Ref Rng      6 - 20 mg/dL 0.61 - 1.24 mg/dL  06/10/2015      57 (H) 3.91 (H)  06/11/2015     1:36 AM 55 (H) 4.17 (H)   Component      EGFR (African American)  Latest Ref Rng      >60 mL/min  06/10/2015      17 (L)  06/11/2015     1:36 AM 16 (L)     Thank you, Mateo Flow, RN [phone] Clinical Documentation Specialist

## 2015-06-11 NOTE — Progress Notes (Signed)
[facility] PROGRESS NOTE  Troy Young XMI:[id number] DOB: 06/12/1951 DOA: 06/10/2015 PCP: [name], MD  Assessment/Plan: 1. ARF 1. See below. Pt's sister reports recent GI illness involving N/V/D x several days 2. Suspect possible dehydration 3. Will start trial of gentle IVF x 8hrs 4. Repeat renal panel in AM 2. Suspected gastroenteritis 1. Resolved 2. Positive sick contacts 3. Question if related to above ARF 3. Chronic anemia 1. Required PRBC tx overnight 2. Follow CBC 4. DM2 1. Glucose stable, controlled 2. Cont SSI coverage 5. Diastolic CHF 1. Recent echo with normal EF and grade 2 diastolic dysfunction 2. CXR personally reviewed with sister. CXR on this admit appear improved compared to previous CXR from last admit 6. DVT prophylaxis 1. SCD's  Code Status: Full Family Communication: Pt in room, sister at bedside (indicate person spoken with, relationship, and if by phone, the number) Disposition Plan: Pending   Consultants:    Procedures:    Antibiotics:    HPI/Subjective: Still complains of mild sob. No chest pain  Objective: Filed Vitals:   06/11/15 0812 06/11/15 1018 06/11/15 1033 06/11/15 1318  BP: 113/64 114/58 125/62 112/59  Pulse: 87 95 100 90  Temp: 99.1 F (37.3 C)   98.8 F (37.1 C)  TempSrc: Oral   Oral  Resp: 27  29 22   Height:      Weight:      SpO2: 94%  96% 95%    Intake/Output Summary (Last 24 hours) at 06/11/15 1511 Last data filed at 06/11/15 1033  Gross per 24 hour  Intake 346.16 ml  Output   1140 ml  Net -793.84 ml   Filed Weights   06/10/15 1541 06/11/15 0100  Weight: 81.647 kg (180 lb) 82.3 kg (181 lb 7 oz)    Exam:   General:  Awake, in nad  Cardiovascular: regular, s1, s2  Respiratory: normal resp effort, no wheezing  Abdomen: soft,nondistended  Musculoskeletal: perfused, no clubbing, no significant LE edema   Data Reviewed: Basic Metabolic Panel:  Recent Labs Lab  06/10/15 1650 06/11/15 0136  NA 135 134*  K 5.1 4.8  CL 107 106  CO2 17* 17*  GLUCOSE 187* 101*  BUN 57* 55*  CREATININE 3.91* 4.17*  CALCIUM 8.8* 8.5*   Liver Function Tests:  Recent Labs Lab 06/11/15 0136  AST 17  ALT 17  ALKPHOS 83  BILITOT 0.7  PROT 7.3  ALBUMIN 2.6*   No results for input(s): LIPASE, AMYLASE in the last 168 hours. No results for input(s): AMMONIA in the last 168 hours. CBC:  Recent Labs Lab 06/10/15 1650 06/11/15 0136 06/11/15 0930  WBC 10.4 9.1  --   HGB 8.1* 6.7* 10.0*  HCT 26.2* 20.5* 31.2*  MCV 93.6 90.3  --   PLT 255 PLATELET CLUMPS NOTED ON SMEAR, COUNT APPEARS ADEQUATE  --    Cardiac Enzymes:  Recent Labs Lab 06/10/15 1910 06/10/15 2254 06/11/15 0136 06/11/15 0930  CKTOTAL  --  205  --   --   CKMB  --  6.2*  --   --   TROPONINI 0.94*  --  0.85* 0.82*   BNP (last 3 results)  Recent Labs  05/07/15 0212 06/10/15 1711  BNP 303.6* 810.7*    ProBNP (last 3 results) No results for input(s): PROBNP in the last 8760 hours.  CBG:  Recent Labs Lab 06/11/15 0208 06/11/15 0815 06/11/15 1320  GLUCAP 98 76 100*    Recent Results (from the past 240 hour(s))  MRSA  PCR Screening     Status: None   Collection Time: 06/11/15  1:08 AM  Result Value Ref Range Status   MRSA by PCR NEGATIVE NEGATIVE Final    Comment:        The GeneXpert MRSA Assay (FDA approved for NASAL specimens only), is one component of a comprehensive MRSA colonization surveillance program. It is not intended to diagnose MRSA infection nor to guide or monitor treatment for MRSA infections.      Studies: Dg Chest 2 View  06/10/2015   CLINICAL DATA:  Progressive shortness of breath for 3 days  EXAM: CHEST  2 VIEW  COMPARISON:  May 08, 2015  FINDINGS: There are small pleural effusions with trace interstitial edema. Heart is upper normal in size with slight pulmonary venous hypertension. No appreciable airspace consolidation. No adenopathy. No bone  lesions.  IMPRESSION: Findings felt to be indicative of a degree of congestive heart failure. No airspace consolidation.   Electronically Signed   By: Lowella Grip III M.D.   On: 06/10/2015 16:16   US Renal  06/11/2015   CLINICAL DATA:  Acute onset of renal failure.  Initial encounter.  EXAM: RENAL / URINARY TRACT ULTRASOUND COMPLETE  COMPARISON:  None.  FINDINGS: Right Kidney:  Length: 10.2 cm. Echogenicity within normal limits. No mass or hydronephrosis visualized.  Left Kidney:  Length: 10.7 cm. Echogenicity within normal limits. No mass or hydronephrosis visualized.  Bladder:  Appears normal for degree of bladder distention. Bilateral ureteral jets are visualized. There appears to be impression on the base of the bladder from the prostate.  IMPRESSION: 1. Unremarkable renal ultrasound. 2. Apparent impression of the prostate on the base of the bladder. Would correlate with PSA.   Electronically Signed   By: Garald Balding M.D.   On: 06/11/2015 02:25    Scheduled Meds: . amLODipine  10 mg Oral Daily  . aspirin EC  81 mg Oral Daily  . glipiZIDE  10 mg Oral Q breakfast  . insulin aspart  0-5 Units Subcutaneous QHS  . insulin aspart  0-9 Units Subcutaneous TID WC  . insulin glargine  8 Units Subcutaneous QHS  . iron polysaccharides  150 mg Oral Daily  . linagliptin  5 mg Oral Daily  . metoprolol tartrate  25 mg Oral BID  . pravastatin  40 mg Oral QPM  . sodium chloride  3 mL Intravenous Q12H  . sodium chloride  3 mL Intravenous Q12H  . tamsulosin  0.8 mg Oral QPM   Continuous Infusions: . sodium chloride 50 mL/hr at 06/11/15 1505    Active Problems:   DM2 (diabetes mellitus, type 2)   CKD stage 4 due to type 2 diabetes mellitus   Elevated troponin   Shortness of breath   Dyspnea    CHIU, STEPHEN K  [facility] Pager [phone]. If 7PM-7AM, please contact night-coverage at [email or web address], password [facility] 06/11/2015, 3:11 PM  LOS: 1 day

## 2015-06-11 NOTE — Care Management Note (Signed)
Case Management Note  Patient Details  Name: Troy Young MRN: [id number] Date of Birth: 19-Dec-1950  Subjective/Objective:        Adm w heart failure, pos troponins            Action/Plan: lives w wife   Expected Discharge Date:                  Expected Discharge Plan:  [place]  In-House Referral:     Discharge planning Services  CM Consult  Post Acute Care Choice:    Choice offered to:     DME Arranged:    DME Agency:     HH Arranged:    [place] Agency:     Status of Service:     Medicare Important Message Given:    Date Medicare IM Given:    Medicare IM give by:    Date Additional Medicare IM Given:    Additional Medicare Important Message give by:     If discussed at [place] of Stay Meetings, dates discussed:    Additional Comments: ur review done  Lacretia Leigh, RN 06/11/2015, 9:58 AM

## 2015-06-11 NOTE — Progress Notes (Signed)
CRITICAL VALUE ALERT  Critical value received: hemoglobin 6.7  Date of notification:  06/11/2015  Time of notification:  0207  Critical value read back:Yes.    Nurse who received alert: Brien Mates RN  MD notified (1st page):  Forrest Moron NP  Time of first page:  0220  MD notified (2nd page):  Time of second page:  Responding MD:  Forrest Moron NP  Time MD responded:  [phone]

## 2015-06-12 DIAGNOSIS — D62 Acute posthemorrhagic anemia: Secondary | ICD-10-CM

## 2015-06-12 DIAGNOSIS — R0600 Dyspnea, unspecified: Secondary | ICD-10-CM

## 2015-06-12 DIAGNOSIS — N179 Acute kidney failure, unspecified: Principal | ICD-10-CM

## 2015-06-12 LAB — HEMOGLOBIN A1C
Hgb A1c MFr Bld: 5 % (ref 4.8–5.6)
Mean Plasma Glucose: 97 mg/dL

## 2015-06-12 LAB — CBC
HEMATOCRIT: 28.1 % — AB (ref 39.0–52.0)
Hemoglobin: 9 g/dL — ABNORMAL LOW (ref 13.0–17.0)
MCH: 29.9 pg (ref 26.0–34.0)
MCHC: 32 g/dL (ref 30.0–36.0)
MCV: 93.4 fL (ref 78.0–100.0)
Platelets: 260 10*3/uL (ref 150–400)
RBC: 3.01 MIL/uL — AB (ref 4.22–5.81)
RDW: 14.7 % (ref 11.5–15.5)
WBC: 11.5 10*3/uL — ABNORMAL HIGH (ref 4.0–10.5)

## 2015-06-12 LAB — BASIC METABOLIC PANEL
Anion gap: 10 (ref 5–15)
BUN: 49 mg/dL — AB (ref 6–20)
CALCIUM: 8.5 mg/dL — AB (ref 8.9–10.3)
CO2: 19 mmol/L — ABNORMAL LOW (ref 22–32)
Chloride: 109 mmol/L (ref 101–111)
Creatinine, Ser: 3.32 mg/dL — ABNORMAL HIGH (ref 0.61–1.24)
GFR calc Af Amer: 21 mL/min — ABNORMAL LOW (ref >60)
GFR calc non Af Amer: 18 mL/min — ABNORMAL LOW (ref >60)
GLUCOSE: 124 mg/dL — AB (ref 65–99)
Potassium: 5.4 mmol/L — ABNORMAL HIGH (ref 3.5–5.1)
Sodium: 138 mmol/L (ref 135–145)

## 2015-06-12 LAB — PROTEIN ELECTROPHORESIS, SERUM
A/G RATIO SPE: 1.2 (ref 0.7–1.7)
ALBUMIN ELP: 3.7 g/dL (ref 2.9–4.4)
ALPHA-1-GLOBULIN: 0.3 g/dL (ref 0.0–0.4)
ALPHA-2-GLOBULIN: 0.8 g/dL (ref 0.4–1.0)
BETA GLOBULIN: 1 g/dL (ref 0.7–1.3)
GLOBULIN, TOTAL: 3.2 g/dL (ref 2.2–3.9)
Gamma Globulin: 1.1 g/dL (ref 0.4–1.8)
TOTAL PROTEIN ELP: 6.9 g/dL (ref 6.0–8.5)

## 2015-06-12 LAB — FOLATE RBC
Folate, Hemolysate: 370.3 ng/mL
Folate, RBC: 1214 ng/mL (ref >498)
HEMATOCRIT: 30.5 % — AB (ref 37.5–51.0)

## 2015-06-12 LAB — TYPE AND SCREEN
ABO/RH(D): O POS
Antibody Screen: NEGATIVE
UNIT DIVISION: 0

## 2015-06-12 LAB — GLUCOSE, CAPILLARY
GLUCOSE-CAPILLARY: 85 mg/dL (ref 65–99)
Glucose-Capillary: 104 mg/dL — ABNORMAL HIGH (ref 65–99)
Glucose-Capillary: 114 mg/dL — ABNORMAL HIGH (ref 65–99)
Glucose-Capillary: 75 mg/dL (ref 65–99)

## 2015-06-12 MED ORDER — SODIUM CHLORIDE 0.9 % IV SOLN
INTRAVENOUS | Status: AC
  Administered 2015-06-12 (×2): via INTRAVENOUS

## 2015-06-12 NOTE — Progress Notes (Signed)
[facility] PROGRESS NOTE  Joncarlo Friberg VHQ:[id number] DOB: 1951/08/16 DOA: 06/10/2015 PCP: [name], MD  Assessment/Plan: 1. ARF 1. Pt note to have recent GI illness involving N/V/D x several days prior to admit with likely resultant dehydration 2. Improved with gentle IVF overnight 3. Cont fluids and repeat renal panel in AM 2. Suspected gastroenteritis 1. Resolved 2. Positive recent sick contacts 3. Question if related to above ARF 3. Chronic anemia 1. Required PRBC tx 2. Hgb decreased from 10-9 overnight 3. Will heme check stools. No obvious signs of bleeding 4. DM2 1. Glucose stable, controlled 2. Cont SSI coverage 5. Diastolic CHF 1. Recent echo with normal EF and grade 2 diastolic dysfunction 2. Recent CXR personally reviewed with sister. CXR on this admit appear improved compared to previous CXR from last admit 3. Monitor closely 6. DVT prophylaxis 1. SCD's  Code Status: Full Family Communication: Pt in room, family at bedside Disposition Plan: Pending   Consultants:    Procedures:    Antibiotics:    HPI/Subjective: Feels better today. Denies sob  Objective: Filed Vitals:   06/12/15 1010 06/12/15 1159 06/12/15 1300 06/12/15 1400  BP: 123/66 117/65 119/68 121/69  Pulse: 94 88 86 88  Temp:  99.2 F (37.3 C)    TempSrc:  Oral    Resp:  20 19 20   Height:      Weight:      SpO2:  98% 98% 93%    Intake/Output Summary (Last 24 hours) at 06/12/15 1549 Last data filed at 06/12/15 1400  Gross per 24 hour  Intake   1323 ml  Output   1100 ml  Net    223 ml   Filed Weights   06/10/15 1541 06/11/15 0100 06/12/15 0500  Weight: 81.647 kg (180 lb) 82.3 kg (181 lb 7 oz) 81.2 kg (179 lb 0.2 oz)    Exam:   General:  Awake, laying in bed, in nad  Cardiovascular: regular, s1, s2  Respiratory: normal resp effort, no wheezing  Abdomen: soft,nondistended  Musculoskeletal: perfused, no clubbing, no cyanosis, no significant LE  edema   Data Reviewed: Basic Metabolic Panel:  Recent Labs Lab 06/10/15 1650 06/11/15 0136 06/12/15 0247  NA 135 134* 138  K 5.1 4.8 5.4*  CL 107 106 109  CO2 17* 17* 19*  GLUCOSE 187* 101* 124*  BUN 57* 55* 49*  CREATININE 3.91* 4.17* 3.32*  CALCIUM 8.8* 8.5* 8.5*   Liver Function Tests:  Recent Labs Lab 06/11/15 0136  AST 17  ALT 17  ALKPHOS 83  BILITOT 0.7  PROT 7.3  ALBUMIN 2.6*   No results for input(s): LIPASE, AMYLASE in the last 168 hours. No results for input(s): AMMONIA in the last 168 hours. CBC:  Recent Labs Lab 06/10/15 1650 06/11/15 0136 06/11/15 0930 06/11/15 1500 06/12/15 0247  WBC 10.4 9.1  --   --  11.5*  HGB 8.1* 6.7* 10.0*  --  9.0*  HCT 26.2* 20.5* 31.2* 30.5* 28.1*  MCV 93.6 90.3  --   --  93.4  PLT 255 PLATELET CLUMPS NOTED ON SMEAR, COUNT APPEARS ADEQUATE  --   --  260   Cardiac Enzymes:  Recent Labs Lab 06/10/15 1910 06/10/15 2254 06/11/15 0136 06/11/15 0930 06/11/15 1505  CKTOTAL  --  205  --   --   --   CKMB  --  6.2*  --   --   --   TROPONINI 0.94*  --  0.85* 0.82* 0.89*   BNP (  last 3 results)  Recent Labs  05/07/15 0212 06/10/15 1711  BNP 303.6* 810.7*    ProBNP (last 3 results) No results for input(s): PROBNP in the last 8760 hours.  CBG:  Recent Labs Lab 06/11/15 1320 06/11/15 1634 06/11/15 2148 06/12/15 0738 06/12/15 1241  GLUCAP 100* 136* 145* 104* 85    Recent Results (from the past 240 hour(s))  MRSA PCR Screening     Status: None   Collection Time: 06/11/15  1:08 AM  Result Value Ref Range Status   MRSA by PCR NEGATIVE NEGATIVE Final    Comment:        The GeneXpert MRSA Assay (FDA approved for NASAL specimens only), is one component of a comprehensive MRSA colonization surveillance program. It is not intended to diagnose MRSA infection nor to guide or monitor treatment for MRSA infections.      Studies: Dg Chest 2 View  06/10/2015   CLINICAL DATA:  Progressive shortness of  breath for 3 days  EXAM: CHEST  2 VIEW  COMPARISON:  May 08, 2015  FINDINGS: There are small pleural effusions with trace interstitial edema. Heart is upper normal in size with slight pulmonary venous hypertension. No appreciable airspace consolidation. No adenopathy. No bone lesions.  IMPRESSION: Findings felt to be indicative of a degree of congestive heart failure. No airspace consolidation.   Electronically Signed   By: Lowella Grip III M.D.   On: 06/10/2015 16:16   US Renal  06/11/2015   CLINICAL DATA:  Acute onset of renal failure.  Initial encounter.  EXAM: RENAL / URINARY TRACT ULTRASOUND COMPLETE  COMPARISON:  None.  FINDINGS: Right Kidney:  Length: 10.2 cm. Echogenicity within normal limits. No mass or hydronephrosis visualized.  Left Kidney:  Length: 10.7 cm. Echogenicity within normal limits. No mass or hydronephrosis visualized.  Bladder:  Appears normal for degree of bladder distention. Bilateral ureteral jets are visualized. There appears to be impression on the base of the bladder from the prostate.  IMPRESSION: 1. Unremarkable renal ultrasound. 2. Apparent impression of the prostate on the base of the bladder. Would correlate with PSA.   Electronically Signed   By: Garald Balding M.D.   On: 06/11/2015 02:25    Scheduled Meds: . amLODipine  10 mg Oral Daily  . aspirin EC  81 mg Oral Daily  . glipiZIDE  10 mg Oral Q breakfast  . insulin aspart  0-5 Units Subcutaneous QHS  . insulin aspart  0-9 Units Subcutaneous TID WC  . insulin glargine  8 Units Subcutaneous QHS  . iron polysaccharides  150 mg Oral Daily  . linagliptin  5 mg Oral Daily  . metoprolol tartrate  25 mg Oral BID  . pravastatin  40 mg Oral QPM  . sodium chloride  3 mL Intravenous Q12H  . sodium chloride  3 mL Intravenous Q12H  . tamsulosin  0.8 mg Oral QPM   Continuous Infusions: . sodium chloride 50 mL/hr at 06/12/15 1307    Active Problems:   DM2 (diabetes mellitus, type 2)   CKD stage 4 due to type 2  diabetes mellitus   Elevated troponin   Shortness of breath   Dyspnea   Classie Weng K  [facility] Pager [phone]. If 7PM-7AM, please contact night-coverage at [email or web address], password [facility] 06/12/2015, 3:49 PM  LOS: 2 days

## 2015-06-13 ENCOUNTER — Inpatient Hospital Stay (HOSPITAL_COMMUNITY): Payer: Medicare HMO

## 2015-06-13 LAB — CBC
HCT: 27 % — ABNORMAL LOW (ref 39.0–52.0)
Hemoglobin: 8.5 g/dL — ABNORMAL LOW (ref 13.0–17.0)
MCH: 29 pg (ref 26.0–34.0)
MCHC: 31.5 g/dL (ref 30.0–36.0)
MCV: 92.2 fL (ref 78.0–100.0)
Platelets: 271 10*3/uL (ref 150–400)
RBC: 2.93 MIL/uL — AB (ref 4.22–5.81)
RDW: 14.2 % (ref 11.5–15.5)
WBC: 8 10*3/uL (ref 4.0–10.5)

## 2015-06-13 LAB — BASIC METABOLIC PANEL
ANION GAP: 5 (ref 5–15)
BUN: 42 mg/dL — ABNORMAL HIGH (ref 6–20)
CHLORIDE: 112 mmol/L — AB (ref 101–111)
CO2: 21 mmol/L — ABNORMAL LOW (ref 22–32)
CREATININE: 2.95 mg/dL — AB (ref 0.61–1.24)
Calcium: 8.2 mg/dL — ABNORMAL LOW (ref 8.9–10.3)
GFR calc non Af Amer: 21 mL/min — ABNORMAL LOW (ref >60)
GFR, EST AFRICAN AMERICAN: 24 mL/min — AB (ref >60)
Glucose, Bld: 58 mg/dL — ABNORMAL LOW (ref 65–99)
Potassium: 5 mmol/L (ref 3.5–5.1)
SODIUM: 138 mmol/L (ref 135–145)

## 2015-06-13 LAB — GLUCOSE, CAPILLARY
GLUCOSE-CAPILLARY: 92 mg/dL (ref 65–99)
GLUCOSE-CAPILLARY: 62 mg/dL — AB (ref 65–99)
GLUCOSE-CAPILLARY: 95 mg/dL (ref 65–99)
Glucose-Capillary: 62 mg/dL — ABNORMAL LOW (ref 65–99)
Glucose-Capillary: 79 mg/dL (ref 65–99)
Glucose-Capillary: 51 mg/dL — ABNORMAL LOW (ref 65–99)

## 2015-06-13 MED ORDER — FUROSEMIDE 10 MG/ML IJ SOLN
40.0000 mg | Freq: Once | INTRAMUSCULAR | Status: AC
  Administered 2015-06-13: 40 mg via INTRAVENOUS

## 2015-06-13 MED ORDER — FUROSEMIDE 40 MG PO TABS
40.0000 mg | ORAL_TABLET | Freq: Every day | ORAL | Status: DC
  Administered 2015-06-14: 40 mg via ORAL
  Filled 2015-06-13 (×2): qty 1

## 2015-06-13 NOTE — Progress Notes (Signed)
[facility] PROGRESS NOTE  Troy Young [id number]:[id number] DOB: [birth year] DOA: 06/10/2015 PCP: [name], MD  Assessment/Plan: 1. ARF 1. Pt note to have recent GI illness involving N/V/D x several days prior to admit with likely resultant dehydration 2. Clinically improved with gentle IVF 3. Cr baseline in the 2.9 range 4. Renal function is currently at baseline 2. Suspected gastroenteritis 1. Resolved 2. Positive recent sick contacts 3. Question if related to above ARF 3. Chronic anemia 1. Required PRBC tx 2. Hgb decreased from 10 post-transfusion to 8.5 3. Will heme check stools.  4. No obvious signs of bleeding 4. DM2 1. Glucose stable, controlled 2. Cont SSI coverage 5. Chronic Diastolic CHF 1. Recent echo with normal EF and grade 2 diastolic dysfunction 2. Recent CXR personally reviewed with sister. CXR on this admit appear improved compared to previous CXR from last admit 3. Pt noted to desat to 86% on ambulation. Will check repeat CXR for interval change. Will start lasix 40mg  po daily (no lasix on home med list) 6. DVT prophylaxis 1. SCD's  Code Status: Full Family Communication: Pt in room Disposition Plan: Pending   Consultants:    Procedures:    Antibiotics:    HPI/Subjective: Feels better today. Denies feeling sob while in bed  Objective: Filed Vitals:   06/13/15 0136 06/13/15 0538 06/13/15 1058 06/13/15 1400  BP: 102/59 108/64 110/63 113/61  Pulse: 81 84 95 93  Temp: 98.4 F (36.9 C) 98.6 F (37 C)  98.6 F (37 C)  TempSrc: Oral Oral  Oral  Resp: 18 20  18   Height:      Weight:      SpO2: 95% 95% 97% 93%    Intake/Output Summary (Last 24 hours) at 06/13/15 1523 Last data filed at 06/13/15 1300  Gross per 24 hour  Intake   1380 ml  Output   1375 ml  Net      5 ml   Filed Weights   06/11/15 0100 06/12/15 0500 06/12/15 1548  Weight: 82.3 kg (181 lb 7 oz) 81.2 kg (179 lb 0.2 oz) 79.788 kg (175 lb 14.4 oz)     Exam:   General:  Awake, laying in bed eating breakfast, in nad  Cardiovascular: regular, s1, s2  Respiratory: normal resp effort, no wheezing  Abdomen: soft,nondistended, nontender  Musculoskeletal: perfused, no clubbing, no cyanosis, no significant LE edema   Data Reviewed: Basic Metabolic Panel:  Recent Labs Lab 06/10/15 1650 06/11/15 0136 06/12/15 0247 06/13/15 0356  NA 135 134* 138 138  Young 5.1 4.8 5.4* 5.0  CL 107 106 109 112*  CO2 17* 17* 19* 21*  GLUCOSE 187* 101* 124* 58*  BUN 57* 55* 49* 42*  CREATININE 3.91* 4.17* 3.32* 2.95*  CALCIUM 8.8* 8.5* 8.5* 8.2*   Liver Function Tests:  Recent Labs Lab 06/11/15 0136  AST 17  ALT 17  ALKPHOS 83  BILITOT 0.7  PROT 7.3  ALBUMIN 2.6*   No results for input(s): LIPASE, AMYLASE in the last 168 hours. No results for input(s): AMMONIA in the last 168 hours. CBC:  Recent Labs Lab 06/10/15 1650 06/11/15 0136 06/11/15 0930 06/11/15 1500 06/12/15 0247 06/13/15 0356  WBC 10.4 9.1  --   --  11.5* 8.0  HGB 8.1* 6.7* 10.0*  --  9.0* 8.5*  HCT 26.2* 20.5* 31.2* 30.5* 28.1* 27.0*  MCV 93.6 90.3  --   --  93.4 92.2  PLT 255 PLATELET CLUMPS NOTED ON SMEAR, COUNT APPEARS ADEQUATE  --   --  260 271   Cardiac Enzymes:  Recent Labs Lab 06/10/15 1910 06/10/15 2254 06/11/15 0136 06/11/15 0930 06/11/15 1505  CKTOTAL  --  205  --   --   --   CKMB  --  6.2*  --   --   --   TROPONINI 0.94*  --  0.85* 0.82* 0.89*   BNP (last 3 results)  Recent Labs  05/07/15 0212 06/10/15 1711  BNP 303.6* 810.7*    ProBNP (last 3 results) No results for input(s): PROBNP in the last 8760 hours.  CBG:  Recent Labs Lab 06/12/15 1550 06/12/15 2221 06/13/15 0528 06/13/15 0610 06/13/15 1142  GLUCAP 114* 75 62* 92 62*    Recent Results (from the past 240 hour(s))  MRSA PCR Screening     Status: None   Collection Time: 06/11/15  1:08 AM  Result Value Ref Range Status   MRSA by PCR NEGATIVE NEGATIVE Final     Comment:        The GeneXpert MRSA Assay (FDA approved for NASAL specimens only), is one component of a comprehensive MRSA colonization surveillance program. It is not intended to diagnose MRSA infection nor to guide or monitor treatment for MRSA infections.      Studies: No results found.  Scheduled Meds: . amLODipine  10 mg Oral Daily  . aspirin EC  81 mg Oral Daily  . furosemide  40 mg Oral Daily  . glipiZIDE  10 mg Oral Q breakfast  . insulin aspart  0-5 Units Subcutaneous QHS  . insulin aspart  0-9 Units Subcutaneous TID WC  . insulin glargine  8 Units Subcutaneous QHS  . iron polysaccharides  150 mg Oral Daily  . linagliptin  5 mg Oral Daily  . metoprolol tartrate  25 mg Oral BID  . pravastatin  40 mg Oral QPM  . sodium chloride  3 mL Intravenous Q12H  . sodium chloride  3 mL Intravenous Q12H  . 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 If 7PM-7AM, please contact night-coverage at [email or web address], password [facility] 06/13/2015, 3:23 PM  LOS: 3 days

## 2015-06-13 NOTE — Progress Notes (Signed)
1430 Pt escorted on ambulation on hallway .Steady gait ,slow .  Pt claimed felt winded going back with 96% on room air and went off balance few steps returning back to room . No dizziness VS stable as charted. Spoke with Dr Wyline Copas .Updated with situation . Spoken with PT for evaluation

## 2015-06-13 NOTE — Progress Notes (Signed)
[place] with Dr Wyline Copas after seen by PT with orders . Informed [t and sister of plan of care. Kept O2 on at 2lm/[address],Kept pt rested in bed

## 2015-06-13 NOTE — Progress Notes (Signed)
Inpatient Diabetes Program Recommendations  AACE/ADA: New Consensus Statement on Inpatient Glycemic Control (2013)  Target Ranges:  Prepandial:   less than 140 mg/dL      Peak postprandial:   less than 180 mg/dL (1-2 hours)      Critically ill patients:  140 - 180 mg/dL   Results for CASTIN, DONAGHUE (MRN [id number]) as of 06/13/2015 11:24  Ref. Range 06/12/2015 07:38 06/12/2015 12:41 06/12/2015 15:50 06/12/2015 22:21 06/13/2015 05:28 06/13/2015 06:10  Glucose-Capillary Latest Ref Range: 65-99 mg/dL 104 (H) 85 114 (H) 75 62 (L) 92   Note CBG's trending <100 mg/dL.  Please consider holding Glucotrol while in the hospital.  Also, note that patient did not receive Lantus yesterday.  Consider holding Lantus until CBG's greater than 140 mg/dL.  Thanks, Adah Perl, RN, BC-ADM Inpatient Diabetes Coordinator Pager [phone] (8a-5p)

## 2015-06-13 NOTE — Care Management Important Message (Signed)
Important Message  Patient Details  Name: Troy Young MRN: [id number] Date of Birth: 07-17-1951   Medicare Important Message Given:  Yes-second notification given    Pricilla Handler 06/13/2015, 11:20 AM

## 2015-06-13 NOTE — Evaluation (Addendum)
Physical Therapy Evaluation Patient Details Name: Troy Young MRN: [id number] DOB: 24-Jul-1951 Today's Date: 06/13/2015   History of Present Illness  64 yo male with dm2, htn, hyperlipidemia apparently c/o dyspnea for the past several days. Pt has increased his po intake of water over the past week. Pt notes slight orthopnea, pnd. Pt denies edema, wt gain, fever, chills, cough, cp, palp, n/v, diarrhea, brbpr, black stool. + nausea and decrease in appetite. Pt was brought to ED and found to have chf, and + trop, and mild ARF on CRF.  Clinical Impression  Pt presents with above.  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  Pt with short shuffled steps during gait and provided HHA to guide pt.  Feel that pt will likely do well with familiar environment and increasing mobility.  Note SaO2 dropped on RA during gait, see below.  Pt will benefit from HHPT for follow up and will likely need home O2 during mobility.  Discussed with family and they verbalize understanding.  Pt to DC today, therefore will defer further therapy to HHPT.   SATURATION QUALIFICATIONS: (This note is used to comply with regulatory documentation for home oxygen)  Patient Saturations on Room Air at Rest = 92%  Patient Saturations on Room Air while Ambulating = 86%  Patient Saturations on 2 Liters of oxygen while Ambulating = 95%  Please briefly explain why patient needs home oxygen: Pt will benefit from home O2 during mobility as he tends to drop sats during gait.     Follow Up Recommendations Home health PT;Supervision/Assistance - 24 hour    Equipment Recommendations  None recommended by PT    Recommendations for Other Services       Precautions / Restrictions Precautions Precautions: Fall Precaution Comments: pt is legally blind, monitor O2 Restrictions Weight Bearing Restrictions: No      Mobility  Bed Mobility Overal bed mobility: Modified Independent             General bed mobility  comments: with HOB flat and without rails  Transfers Overall transfer level: Needs assistance Equipment used: None Transfers: Sit to/from Stand Sit to Stand: Supervision         General transfer comment: Min cues for hand placement  Ambulation/Gait Ambulation/Gait assistance: Min guard (HHA to guide due to blindness) Ambulation Distance (Feet): 120 Feet Assistive device: 1 person hand held assist Gait Pattern/deviations: Step-through pattern;Shuffle;Trunk flexed;Narrow base of support     General Gait Details: Pt with very short shuffled steps, despite being cued for increased stride length.  Pt states he feels more comfortable in home environment and with use of blind cane.  Provided HHA for safety and to guide pt during gait.   Stairs Stairs: Yes Stairs assistance: Min assist Stair Management: One rail Right;Step to pattern;Forwards Number of Stairs: 2 General stair comments: HHA on L with use of R rail.  Education to sister and pt to provided B HHA due to not having rails at home.    Wheelchair Mobility    Modified Rankin (Stroke Patients Only)       Balance Overall balance assessment: Needs assistance Sitting-balance support: Feet supported Sitting balance-Leahy Scale: Good     Standing balance support: Single extremity supported;During functional activity Standing balance-Leahy Scale: Fair                               Pertinent Vitals/Pain Pain Assessment: No/denies pain  Home Living Family/patient expects to be discharged to:: Private residence Living Arrangements: Other relatives Available Help at Discharge: Family;Available 24 hours/day Type of Home: Apartment Home Access: Stairs to enter Entrance Stairs-Rails: None Entrance Stairs-Number of Steps: 2 Home Layout: One level Home Equipment: Other (comment) (blind cane)      Prior Function Level of Independence: Independent with assistive device(s)         Comments: amb with  blind cane I'ly     Hand Dominance        Extremity/Trunk Assessment               Lower Extremity Assessment: Generalized weakness      Cervical / Trunk Assessment: Normal  Communication   Communication: No difficulties  Cognition Arousal/Alertness: Awake/alert Behavior During Therapy: WFL for tasks assessed/performed Overall Cognitive Status: Within Functional Limits for tasks assessed                      General Comments      Exercises        Assessment/Plan    PT Assessment All further PT needs can be met in the next venue of care  PT Diagnosis Difficulty walking;Generalized weakness   PT Problem List Decreased strength;Decreased activity tolerance;Decreased balance;Decreased mobility;Decreased safety awareness  PT Treatment Interventions     PT Goals (Current goals can be found in the Care Plan section) Acute Rehab PT Goals Patient Stated Goal: to return home PT Goal Formulation: With patient/family Potential to Achieve Goals: Good    Frequency     Barriers to discharge        Co-evaluation               End of Session   Activity Tolerance: Patient tolerated treatment well Patient left: in bed;with call bell/phone within reach;with family/visitor present Nurse Communication: Mobility status (O2 sats)         Time: 4628-6381 PT Time Calculation (min) (ACUTE ONLY): 24 min   Charges:   PT Evaluation $Initial PT Evaluation Tier I: 1 Procedure PT Treatments $Gait Training: 8-22 mins   PT G CodesDenice Bors 06/13/2015, 3:18 PM

## 2015-06-14 LAB — CBC
HEMATOCRIT: 28.7 % — AB (ref 39.0–52.0)
Hemoglobin: 9.1 g/dL — ABNORMAL LOW (ref 13.0–17.0)
MCH: 29 pg (ref 26.0–34.0)
MCHC: 31.7 g/dL (ref 30.0–36.0)
MCV: 91.4 fL (ref 78.0–100.0)
PLATELETS: 310 10*3/uL (ref 150–400)
RBC: 3.14 MIL/uL — AB (ref 4.22–5.81)
RDW: 14 % (ref 11.5–15.5)
WBC: 8.8 10*3/uL (ref 4.0–10.5)

## 2015-06-14 LAB — GLUCOSE, CAPILLARY
GLUCOSE-CAPILLARY: 61 mg/dL — AB (ref 65–99)
GLUCOSE-CAPILLARY: 83 mg/dL (ref 65–99)
GLUCOSE-CAPILLARY: 115 mg/dL — AB (ref 65–99)

## 2015-06-14 LAB — BASIC METABOLIC PANEL
Anion gap: 9 (ref 5–15)
BUN: 40 mg/dL — ABNORMAL HIGH (ref 6–20)
CHLORIDE: 107 mmol/L (ref 101–111)
CO2: 22 mmol/L (ref 22–32)
Calcium: 8.6 mg/dL — ABNORMAL LOW (ref 8.9–10.3)
Creatinine, Ser: 2.78 mg/dL — ABNORMAL HIGH (ref 0.61–1.24)
GFR calc non Af Amer: 22 mL/min — ABNORMAL LOW (ref >60)
GFR, EST AFRICAN AMERICAN: 26 mL/min — AB (ref >60)
Glucose, Bld: 64 mg/dL — ABNORMAL LOW (ref 65–99)
Potassium: 4.8 mmol/L (ref 3.5–5.1)
SODIUM: 138 mmol/L (ref 135–145)

## 2015-06-14 MED ORDER — FUROSEMIDE 40 MG PO TABS: 40.0000 mg | ORAL_TABLET | Freq: Every day | ORAL | Status: DC

## 2015-06-14 NOTE — Progress Notes (Signed)
Pt ambulated in hallway without O2. Sat 93-94%.

## 2015-06-14 NOTE — Discharge Summary (Signed)
Physician Discharge Summary  Troy Young YCX:[id number] DOB: 25-Dec-1950 DOA: 06/10/2015  PCP: [name], MD  Admit date: 06/10/2015 Discharge date: 06/14/2015  Time spent: 20 minutes  Recommendations for Outpatient Follow-up:  1. Follow up with PCP in 1-2 weeks 2. Follow up with Dr. Percival Spanish as scheduled 3. Please repeat renal panel in 1 week  Discharge Diagnoses:  Active Problems:   DM2 (diabetes mellitus, type 2)   CKD stage 4 due to type 2 diabetes mellitus   Elevated troponin   Shortness of breath   Dyspnea   Discharge Condition: Improved  Diet recommendation: Diabetic, heart healthy  Filed Weights   06/12/15 0500 06/12/15 1548 06/14/15 0355  Weight: 81.2 kg (179 lb 0.2 oz) 79.788 kg (175 lb 14.4 oz) 79.2 kg (174 lb 9.7 oz)    History of present illness:  Please see admit h and p from 8/2 for details. Briefly, pt presented with dyspnea and ARF. The patient was admitted for further work up.  Hospital Course:  1. ARF 1. Pt noted to have recent GI illness involving N/V/D x several days prior to admit with likely resultant dehydration 2. Clinically much improved with gentle IVF 3. Cr baseline in the 2.9 range 4. Renal function has since returned to basline 2. Suspected gastroenteritis 1. Resolved 2. Positive recent sick contacts 3. Question if related to above ARF 3. Chronic anemia 1. Required PRBC tx 2. Hgb decreased from 10 post-transfusion to 8.5 3. No obvious signs of bleeding 4. Hgb remained stable 4. DM2 1. Glucose stable, controlled 2. Cont SSI coverage 5. Chronic Diastolic CHF 1. Recent echo with normal EF and grade 2 diastolic dysfunction 2. Briefly developed acute heart failure and required one dose of IV lasix with improvment 3. Pt was continued on lasix 40mg  daily on discharge 6. DVT prophylaxis 1. SCD's  Discharge Exam: Filed Vitals:   06/13/15 1400 06/13/15 2054 06/14/15 0355 06/14/15 0954  BP: 113/61 111/60 115/69 127/66   Pulse: 93 84 85   Temp: 98.6 F (37 C) 98.2 F (36.8 C) 98 F (36.7 C)   TempSrc: Oral Oral Oral   Resp: 18 18 18    Height:      Weight:   79.2 kg (174 lb 9.7 oz)   SpO2: 93% 98% 98%     General: Awake, in nad Cardiovascular: regular, s1, s2 Respiratory: normal resp effort, no wheezing  Discharge Instructions     Medication List    STOP taking these medications        levofloxacin 750 MG tablet  Commonly known as:  LEVAQUIN     losartan 100 MG tablet  Commonly known as:  COZAAR      TAKE these medications        amLODipine 10 MG tablet  Commonly known as:  NORVASC  Take 10 mg by mouth daily.     aspirin EC 81 MG tablet  Take 81 mg by mouth daily.     furosemide 40 MG tablet  Commonly known as:  LASIX  Take 1 tablet (40 mg total) by mouth daily.     glipiZIDE 10 MG 24 hr tablet  Commonly known as:  GLUCOTROL XL  Take 10 mg by mouth daily with breakfast.     insulin glargine 100 UNIT/ML injection  Commonly known as:  LANTUS  Inject 8 Units into the skin at bedtime.     iron polysaccharides 150 MG capsule  Commonly known as:  NIFEREX  Take 150 mg by mouth  daily.     metoprolol tartrate 25 MG tablet  Commonly known as:  LOPRESSOR  Take 25 mg by mouth 2 (two) times daily.     pravastatin 40 MG tablet  Commonly known as:  PRAVACHOL  Take 40 mg by mouth every evening.     sitaGLIPtin 50 MG tablet  Commonly known as:  JANUVIA  Take 50 mg by mouth daily.     tamsulosin 0.4 MG Caps capsule  Commonly known as:  FLOMAX  Take 0.8 mg by mouth every evening.     vitamin C 250 MG tablet  Commonly known as:  ASCORBIC ACID  Take 250 mg by mouth daily.       No Known Allergies Follow-up Information    Follow up with BULLERS, CHRISTOPHER TODD, MD. Schedule an appointment as soon as possible for a visit in 1 week.   Specialty:  Family Medicine   Why:  Hospital follow up   Contact information:   [place]  [id number] [phone]       Schedule an appointment as soon as possible for a visit with Minus Breeding, MD.   Specialty:  Cardiology   Why:  Hospital follow up   Contact information:   [place] [address] [phone]        The results of significant diagnostics from this hospitalization (including imaging, microbiology, ancillary and laboratory) are listed below for reference.    Significant Diagnostic Studies: Dg Chest 2 View  06/10/2015   CLINICAL DATA:  Progressive shortness of breath for 3 days  EXAM: CHEST  2 VIEW  COMPARISON:  May 08, 2015  FINDINGS: There are small pleural effusions with trace interstitial edema. Heart is upper normal in size with slight pulmonary venous hypertension. No appreciable airspace consolidation. No adenopathy. No bone lesions.  IMPRESSION: Findings felt to be indicative of a degree of congestive heart failure. No airspace consolidation.   Electronically Signed   By: Lowella Grip III M.D.   On: 06/10/2015 16:16   US Renal  06/11/2015   CLINICAL DATA:  Acute onset of renal failure.  Initial encounter.  EXAM: RENAL / URINARY TRACT ULTRASOUND COMPLETE  COMPARISON:  None.  FINDINGS: Right Kidney:  Length: 10.2 cm. Echogenicity within normal limits. No mass or hydronephrosis visualized.  Left Kidney:  Length: 10.7 cm. Echogenicity within normal limits. No mass or hydronephrosis visualized.  Bladder:  Appears normal for degree of bladder distention. Bilateral ureteral jets are visualized. There appears to be impression on the base of the bladder from the prostate.  IMPRESSION: 1. Unremarkable renal ultrasound. 2. Apparent impression of the prostate on the base of the bladder. Would correlate with PSA.   Electronically Signed   By: Garald Balding M.D.   On: 06/11/2015 02:25   Dg Chest Port 1 View  06/13/2015   CLINICAL DATA:  CHF, increasing shortness of breath, history diabetes mellitus, hypertension, cancer not otherwise specified,  former smoker  EXAM: PORTABLE CHEST - 1 VIEW  COMPARISON:  Portable exam 1554 hours compared 06/10/2015  FINDINGS: Enlargement of cardiac silhouette with vascular congestion.  Perihilar infiltrates favoring pulmonary edema and mild CHF.  Suspect small bibasilar pleural effusions.  No pneumothorax or segmental consolidation.  Mild dextro convex thoracic scoliosis.  IMPRESSION: Probable mild CHF.   Electronically Signed   By: Lavonia Dana M.D.   On: 06/13/2015 16:06    Microbiology: Recent Results (from the past 240 hour(s))  MRSA PCR  Screening     Status: None   Collection Time: 06/11/15  1:08 AM  Result Value Ref Range Status   MRSA by PCR NEGATIVE NEGATIVE Final    Comment:        The GeneXpert MRSA Assay (FDA approved for NASAL specimens only), is one component of a comprehensive MRSA colonization surveillance program. It is not intended to diagnose MRSA infection nor to guide or monitor treatment for MRSA infections.      Labs: Basic Metabolic Panel:  Recent Labs Lab 06/10/15 1650 06/11/15 0136 06/12/15 0247 06/13/15 0356 06/14/15 0340  NA 135 134* 138 138 138  K 5.1 4.8 5.4* 5.0 4.8  CL 107 106 109 112* 107  CO2 17* 17* 19* 21* 22  GLUCOSE 187* 101* 124* 58* 64*  BUN 57* 55* 49* 42* 40*  CREATININE 3.91* 4.17* 3.32* 2.95* 2.78*  CALCIUM 8.8* 8.5* 8.5* 8.2* 8.6*   Liver Function Tests:  Recent Labs Lab 06/11/15 0136  AST 17  ALT 17  ALKPHOS 83  BILITOT 0.7  PROT 7.3  ALBUMIN 2.6*   No results for input(s): LIPASE, AMYLASE in the last 168 hours. No results for input(s): AMMONIA in the last 168 hours. CBC:  Recent Labs Lab 06/10/15 1650 06/11/15 0136 06/11/15 0930 06/11/15 1500 06/12/15 0247 06/13/15 0356 06/14/15 0938  WBC 10.4 9.1  --   --  11.5* 8.0 8.8  HGB 8.1* 6.7* 10.0*  --  9.0* 8.5* 9.1*  HCT 26.2* 20.5* 31.2* 30.5* 28.1* 27.0* 28.7*  MCV 93.6 90.3  --   --  93.4 92.2 91.4  PLT 255 PLATELET CLUMPS NOTED ON SMEAR, COUNT APPEARS ADEQUATE   --   --  260 271 310   Cardiac Enzymes:  Recent Labs Lab 06/10/15 1910 06/10/15 2254 06/11/15 0136 06/11/15 0930 06/11/15 1505  CKTOTAL  --  205  --   --   --   CKMB  --  6.2*  --   --   --   TROPONINI 0.94*  --  0.85* 0.82* 0.89*   BNP: BNP (last 3 results)  Recent Labs  05/07/15 0212 06/10/15 1711  BNP 303.6* 810.7*    ProBNP (last 3 results) No results for input(s): PROBNP in the last 8760 hours.  CBG:  Recent Labs Lab 06/13/15 2138 06/13/15 2231 06/14/15 0614 06/14/15 0649 06/14/15 1126  GLUCAP 51* 95 61* 83 115*    Signed:  Solly Derasmo K  [facility] 06/14/2015, 6:30 PM

## 2015-06-14 NOTE — Clinical Documentation Improvement (Signed)
Chronic anemia currently documented in progress notes along with CKD stage 4.   Please clarify if the patient's chronic anemia can be further specified as related to the CKD (a cause and effect relationship cannot be determined and must be documented by the provider).  .    Possible Clinical Conditions: -chronic anemia in ckd -chronic anemia in other chronic disease (please specify) -Other  -Unable to determine at present  Thank you, Mateo Flow, RN [phone] Clinical Documentation Specialist

## 2015-06-14 NOTE — Care Management Note (Signed)
Case Management Note  Patient Details  Name: Troy Young MRN: [id number] Date of Birth: [birth year]  Subjective/Objective:                   Dyspnea secondary to [facility] and + trop, and mild ARF on CRF. Action/Plan: D/c home with HHPT/ Nursing/ aide  Expected Discharge Date: 06/14/2015                 Expected Discharge Plan:   [place] Referral:     Discharge planning Services   CM Consult  Post Acute Care Choice:  NA Choice offered to:  Spouse  DME Arranged:   N/A DME Agency:   N/A  HH Arranged:  RN, PT, NA HH Agency:  [place]  Status of Service:  Completed, signed off  Medicare Important Message Given:  Yes-second notification given Date Medicare IM Given:    Medicare IM give by:    Date Additional Medicare IM Given:    Additional Medicare  Important Message give by:     If discussed at [place] of Stay Meetings, dates discussed:    Additional Comments: Into speak to patient and wife, patient is legally blind choice list given to wife.  [facility] chosen,  Suanne Marker rep notified services set up ( HHRN/ HHPT/ [facility] aide) information faxed to company.  No other CM services needed at this time .   Corine Shelter, RN 06/14/2015, 2:41 PM

## 2015-06-20 ENCOUNTER — Ambulatory Visit: Payer: Medicare HMO | Admitting: Cardiology

## 2015-06-25 ENCOUNTER — Encounter: Payer: Self-pay | Admitting: Cardiology

## 2015-07-01 ENCOUNTER — Encounter: Payer: Self-pay | Admitting: Cardiology

## 2015-07-01 ENCOUNTER — Ambulatory Visit (INDEPENDENT_AMBULATORY_CARE_PROVIDER_SITE_OTHER): Payer: Medicare HMO | Admitting: Cardiology

## 2015-07-01 VITALS — BP 124/70 | HR 63 | Ht 68.0 in | Wt 171.0 lb

## 2015-07-01 DIAGNOSIS — R931 Abnormal findings on diagnostic imaging of heart and coronary circulation: Secondary | ICD-10-CM | POA: Diagnosis not present

## 2015-07-01 NOTE — Progress Notes (Signed)
Cardiology Office Note   Date:  07/01/2015   ID:  Troy Young, DOB [birth year], MRN [id number]  PCP:  [name], MD  Cardiologist:   Minus Breeding, MD   Chief Complaint  Patient presents with  . Abnormal echo      History of Present Illness: Troy Young is a 64 y.o. male who presents for follow-up of an abnormal troponin. He's had a couple hospitalizations. We saw him when he had a pneumonia. He had a mildly elevated troponin. He did have regional wall motion abnormalities on echo and was to have an outpatient stress test but this has not happened. He was most recently admitted with acute on chronic renal insufficiency. He does have some evidence of diastolic dysfunction. He has anterior defect consistent with previous infarct but an overall preserved ejection fraction. He returns for follow-up.  Since I last saw him he's had no cardiac complaints. He thinks his breathing is back to baseline. He works at the [employer]. He denies any cardiovascular symptoms. The patient denies any new symptoms such as chest discomfort, neck or arm discomfort. There has been no new shortness of breath, PND or orthopnea. There have been no reported palpitations, presyncope or syncope.  Past Medical History  Diagnosis Date  . Legally blind   . DM2 (diabetes mellitus, type 2)   . Hypertension   . Hyperlipidemia   . BPH (benign prostatic hyperplasia)   . Cancer     colon cancer (malignant polyp) removed  . Spontaneous pneumothorax     2  . Renal insufficiency   . Anemia     Past Surgical History  Procedure Laterality Date  . Polyp removed    . Eye surgery    . Cataract extraction       Current Outpatient Prescriptions  Medication Sig Dispense Refill  . amLODipine (NORVASC) 10 MG tablet Take 10 mg by mouth daily.    Marland Kitchen aspirin EC 81 MG tablet Take 81 mg by mouth daily.    . furosemide (LASIX) 40 MG tablet Take 1 tablet (40 mg total) by mouth daily. 30  tablet 0  . glipiZIDE (GLUCOTROL XL) 10 MG 24 hr tablet Take 10 mg by mouth daily with breakfast.    . insulin glargine (LANTUS) 100 UNIT/ML injection Inject 8 Units into the skin at bedtime.    . iron polysaccharides (NIFEREX) 150 MG capsule Take 150 mg by mouth daily.    . metoprolol tartrate (LOPRESSOR) 25 MG tablet Take 25 mg by mouth 2 (two) times daily.    . pravastatin (PRAVACHOL) 40 MG tablet Take 40 mg by mouth every evening.    . sitaGLIPtin (JANUVIA) 50 MG tablet Take 50 mg by mouth daily.    . tamsulosin (FLOMAX) 0.4 MG CAPS capsule Take 0.8 mg by mouth every evening.    . vitamin C (ASCORBIC ACID) 250 MG tablet Take 250 mg by mouth daily.     No current facility-administered medications for this visit.    Allergies:   Review of patient's allergies indicates no known allergies.    Social History:  The patient  reports that he has quit smoking. His smoking use included Cigarettes. He has a 20 pack-year smoking history. He does not have any smokeless tobacco history on file. He reports that he does not drink alcohol.   Family History:  The patient's family history includes Cancer in his father; Ulcers in his mother.    ROS:  Please see  the history of present illness.   Otherwise, review of systems are positive for none.   All other systems are reviewed and negative.    PHYSICAL EXAM: VS:  BP 124/70 mmHg  Pulse 63  Ht 5\' 8"  (1.727 m)  Wt 171 lb (77.565 kg)  BMI 26.01 kg/m2 , BMI Body mass index is 26.01 kg/(m^2). GENERAL:  Well appearing HEENT:  Pupils nonreactive, fundi not visualized, oral mucosa unremarkable NECK:  No jugular venous distention, waveform within normal limits, carotid upstroke brisk and symmetric, no bruits, no thyromegaly LYMPHATICS:  No cervical, inguinal adenopathy LUNGS:  Clear to auscultation bilaterally BACK:  No CVA tenderness CHEST:  Unremarkable HEART:  PMI not displaced or sustained,S1 and S2 within normal limits, no S3, no S4, no clicks, no  rubs, no murmurs ABD:  Flat, positive bowel sounds normal in frequency in pitch, no bruits, no rebound, no guarding, no midline pulsatile mass, no hepatomegaly, no splenomegaly EXT:  2 plus pulses throughout, no edema, no cyanosis no clubbing SKIN:  No rashes no nodules NEURO:  Cranial nerves II through XII grossly intact, motor grossly intact throughout PSYCH:  Cognitively intact, oriented to person place and time    EKG:  EKG is not ordered today.    Recent Labs: 06/10/2015: B Natriuretic Peptide 810.7* 06/11/2015: ALT 17; TSH 5.247* 06/14/2015: BUN 40*; Creatinine, Ser 2.78*; Hemoglobin 9.1*; Platelets 310; Potassium 4.8; Sodium 138    Lipid Panel No results found for: CHOL, TRIG, HDL, CHOLHDL, VLDL, LDLCALC, LDLDIRECT    Wt Readings from Last 3 Encounters:  07/01/15 171 lb (77.565 kg)  06/14/15 174 lb 9.7 oz (79.2 kg)  05/10/15 182 lb 3.2 oz (82.645 kg)      Other studies Reviewed: Additional studies/ records that were reviewed today include: Hospital records. Review of the above records demonstrates:  Please see elsewhere in the note.     ASSESSMENT AND PLAN:  ABNORMAL ECHO:  The patient almost definitely has coronary disease with a regional wall motion abnormality on his echo. He doesn't have any active symptoms but I'm going to screen him for risk stratification with a Lexiscan Myoview.  I'll be looking for large areas of active ischemia prior to considering any higher risk invasive evaluation.  CKD:  He follows with his primary provider and renal.  ELEVATED TROPONIN:  As above.  CHRONIC DIASTOLIC HF:  He appears to be euvolemic. No change in therapy is planned.   Current medicines are reviewed at length with the patient today.  The patient does not have concerns regarding medicines.  The following changes have been made:  no change  Labs/ tests ordered today include:   Orders Placed This Encounter  Procedures  . Myocardial Perfusion Imaging     Disposition:    FU with me as needed.    Signed, Minus Breeding, MD  07/01/2015 12:46 PM    [facility]

## 2015-07-01 NOTE — Patient Instructions (Signed)
Your physician has requested that you have a lexiscan myoview. For further information please visit [email or web address]. Please follow instruction sheet, as given.  Your physician recommends that you schedule a follow-up appointment in: Based on result of test

## 2015-07-15 ENCOUNTER — Telehealth (HOSPITAL_COMMUNITY): Payer: Self-pay

## 2015-07-15 NOTE — Telephone Encounter (Signed)
Encounter complete. 

## 2015-07-17 ENCOUNTER — Ambulatory Visit (HOSPITAL_COMMUNITY)
Admission: RE | Admit: 2015-07-17 | Discharge: 2015-07-17 | Disposition: A | Discharge status: Home / Self Care | Payer: Medicare HMO | Source: Ambulatory Visit | Attending: Cardiovascular Disease | Admitting: Cardiovascular Disease

## 2015-07-17 DIAGNOSIS — I10 Essential (primary) hypertension: Secondary | ICD-10-CM | POA: Insufficient documentation

## 2015-07-17 DIAGNOSIS — R9439 Abnormal result of other cardiovascular function study: Secondary | ICD-10-CM | POA: Insufficient documentation

## 2015-07-17 DIAGNOSIS — R931 Abnormal findings on diagnostic imaging of heart and coronary circulation: Secondary | ICD-10-CM

## 2015-07-17 DIAGNOSIS — F17200 Nicotine dependence, unspecified, uncomplicated: Secondary | ICD-10-CM | POA: Diagnosis not present

## 2015-07-17 DIAGNOSIS — I517 Cardiomegaly: Secondary | ICD-10-CM | POA: Diagnosis not present

## 2015-07-17 DIAGNOSIS — E119 Type 2 diabetes mellitus without complications: Secondary | ICD-10-CM | POA: Insufficient documentation

## 2015-07-17 MED ORDER — TECHNETIUM TC 99M SESTAMIBI GENERIC - CARDIOLITE
30.2000 | Freq: Once | INTRAVENOUS | Status: AC | PRN
  Administered 2015-07-17: 32.6 via INTRAVENOUS

## 2015-07-17 MED ORDER — REGADENOSON 0.4 MG/5ML IV SOLN
0.4000 mg | Freq: Once | INTRAVENOUS | Status: AC
  Administered 2015-07-17: 0.4 mg via INTRAVENOUS

## 2015-07-17 MED ORDER — TECHNETIUM TC 99M SESTAMIBI GENERIC - CARDIOLITE
10.3000 | Freq: Once | INTRAVENOUS | Status: AC | PRN
  Administered 2015-07-17: 10.3 via INTRAVENOUS

## 2015-07-18 LAB — MYOCARDIAL PERFUSION IMAGING
CHL CUP NUCLEAR SRS: 9
CHL CUP NUCLEAR SSS: 18
CSEPPHR: 95 {beats}/min
LV dias vol: 134 mL
LV sys vol: 72 mL
NUC STRESS TID: 1.08
Rest HR: 62 {beats}/min
SDS: 9

## 2015-08-01 ENCOUNTER — Telehealth: Payer: Self-pay | Admitting: Cardiology

## 2015-08-01 NOTE — Telephone Encounter (Signed)
Pt would ilke his stress test results from 07-17-15 please.

## 2015-08-01 NOTE — Telephone Encounter (Signed)
Called patient directly (could not find DPR doc for Troy Young)  Spoke to patient - caller was his sister. Gave him information directly, he voiced acknowledgement - advised to call if further needs, recommended updating DPR next time in office.  Pt OK w/ this plan.

## 2015-09-28 ENCOUNTER — Emergency Department (HOSPITAL_COMMUNITY): Payer: Medicare HMO

## 2015-09-28 ENCOUNTER — Inpatient Hospital Stay (HOSPITAL_COMMUNITY)
Admission: EM | Admit: 2015-09-28 | Discharge: 2015-10-02 | DRG: 871 | Disposition: A | Discharge status: Home Health | Payer: Medicare HMO | Attending: Family Medicine | Admitting: Family Medicine

## 2015-09-28 ENCOUNTER — Encounter (HOSPITAL_COMMUNITY): Payer: Self-pay | Admitting: Emergency Medicine

## 2015-09-28 DIAGNOSIS — R748 Abnormal levels of other serum enzymes: Secondary | ICD-10-CM | POA: Diagnosis present

## 2015-09-28 DIAGNOSIS — B349 Viral infection, unspecified: Secondary | ICD-10-CM | POA: Diagnosis present

## 2015-09-28 DIAGNOSIS — R0602 Shortness of breath: Secondary | ICD-10-CM | POA: Diagnosis not present

## 2015-09-28 DIAGNOSIS — D631 Anemia in chronic kidney disease: Secondary | ICD-10-CM | POA: Diagnosis present

## 2015-09-28 DIAGNOSIS — I130 Hypertensive heart and chronic kidney disease with heart failure and stage 1 through stage 4 chronic kidney disease, or unspecified chronic kidney disease: Secondary | ICD-10-CM | POA: Diagnosis present

## 2015-09-28 DIAGNOSIS — I472 Ventricular tachycardia: Secondary | ICD-10-CM | POA: Diagnosis present

## 2015-09-28 DIAGNOSIS — H548 Legal blindness, as defined in USA: Secondary | ICD-10-CM | POA: Diagnosis present

## 2015-09-28 DIAGNOSIS — Z87891 Personal history of nicotine dependence: Secondary | ICD-10-CM

## 2015-09-28 DIAGNOSIS — Z85038 Personal history of other malignant neoplasm of large intestine: Secondary | ICD-10-CM

## 2015-09-28 DIAGNOSIS — J988 Other specified respiratory disorders: Secondary | ICD-10-CM | POA: Diagnosis present

## 2015-09-28 DIAGNOSIS — I5023 Acute on chronic systolic (congestive) heart failure: Secondary | ICD-10-CM | POA: Diagnosis present

## 2015-09-28 DIAGNOSIS — R778 Other specified abnormalities of plasma proteins: Secondary | ICD-10-CM | POA: Diagnosis present

## 2015-09-28 DIAGNOSIS — A419 Sepsis, unspecified organism: Secondary | ICD-10-CM | POA: Diagnosis not present

## 2015-09-28 DIAGNOSIS — N400 Enlarged prostate without lower urinary tract symptoms: Secondary | ICD-10-CM | POA: Diagnosis present

## 2015-09-28 DIAGNOSIS — R509 Fever, unspecified: Secondary | ICD-10-CM | POA: Insufficient documentation

## 2015-09-28 DIAGNOSIS — N179 Acute kidney failure, unspecified: Secondary | ICD-10-CM | POA: Diagnosis present

## 2015-09-28 DIAGNOSIS — Z794 Long term (current) use of insulin: Secondary | ICD-10-CM

## 2015-09-28 DIAGNOSIS — R7989 Other specified abnormal findings of blood chemistry: Secondary | ICD-10-CM | POA: Diagnosis present

## 2015-09-28 DIAGNOSIS — I509 Heart failure, unspecified: Secondary | ICD-10-CM

## 2015-09-28 DIAGNOSIS — E785 Hyperlipidemia, unspecified: Secondary | ICD-10-CM | POA: Diagnosis present

## 2015-09-28 DIAGNOSIS — N184 Chronic kidney disease, stage 4 (severe): Secondary | ICD-10-CM | POA: Diagnosis present

## 2015-09-28 DIAGNOSIS — Z7982 Long term (current) use of aspirin: Secondary | ICD-10-CM

## 2015-09-28 DIAGNOSIS — D649 Anemia, unspecified: Secondary | ICD-10-CM | POA: Diagnosis present

## 2015-09-28 DIAGNOSIS — J9600 Acute respiratory failure, unspecified whether with hypoxia or hypercapnia: Secondary | ICD-10-CM | POA: Diagnosis present

## 2015-09-28 DIAGNOSIS — J9601 Acute respiratory failure with hypoxia: Secondary | ICD-10-CM | POA: Diagnosis present

## 2015-09-28 DIAGNOSIS — E1122 Type 2 diabetes mellitus with diabetic chronic kidney disease: Secondary | ICD-10-CM | POA: Diagnosis present

## 2015-09-28 DIAGNOSIS — J189 Pneumonia, unspecified organism: Secondary | ICD-10-CM | POA: Diagnosis present

## 2015-09-28 DIAGNOSIS — E119 Type 2 diabetes mellitus without complications: Secondary | ICD-10-CM

## 2015-09-28 DIAGNOSIS — I5043 Acute on chronic combined systolic (congestive) and diastolic (congestive) heart failure: Secondary | ICD-10-CM | POA: Diagnosis present

## 2015-09-28 LAB — CBC
HEMATOCRIT: 32.1 % — AB (ref 39.0–52.0)
Hemoglobin: 10.5 g/dL — ABNORMAL LOW (ref 13.0–17.0)
MCH: 31 pg (ref 26.0–34.0)
MCHC: 32.7 g/dL (ref 30.0–36.0)
MCV: 94.7 fL (ref 78.0–100.0)
Platelets: 231 10*3/uL (ref 150–400)
RBC: 3.39 MIL/uL — AB (ref 4.22–5.81)
RDW: 14 % (ref 11.5–15.5)
WBC: 11.8 10*3/uL — AB (ref 4.0–10.5)

## 2015-09-28 LAB — I-STAT TROPONIN, ED: Troponin i, poc: 1.18 ng/mL (ref 0.00–0.08)

## 2015-09-28 LAB — BASIC METABOLIC PANEL
ANION GAP: 9 (ref 5–15)
BUN: 54 mg/dL — ABNORMAL HIGH (ref 6–20)
CO2: 19 mmol/L — ABNORMAL LOW (ref 22–32)
Calcium: 8.6 mg/dL — ABNORMAL LOW (ref 8.9–10.3)
Chloride: 110 mmol/L (ref 101–111)
Creatinine, Ser: 3.22 mg/dL — ABNORMAL HIGH (ref 0.61–1.24)
GFR calc Af Amer: 22 mL/min — ABNORMAL LOW (ref >60)
GFR calc non Af Amer: 19 mL/min — ABNORMAL LOW (ref >60)
GLUCOSE: 115 mg/dL — AB (ref 65–99)
POTASSIUM: 5 mmol/L (ref 3.5–5.1)
SODIUM: 138 mmol/L (ref 135–145)

## 2015-09-28 NOTE — ED Notes (Signed)
EDP wants lab called to cancel PT/INR. Lab has been called

## 2015-09-28 NOTE — ED Provider Notes (Signed)
CSN: [id number]     Arrival date & time 09/28/15  2131 History   First MD Initiated Contact with Patient 09/28/15 2154     Chief Complaint  Patient presents with  . Shortness of Breath     (Consider location/radiation/quality/duration/timing/severity/associated sxs/prior Treatment) HPI Complains of shortness of breath onset apprise me 3 days ago accompanied by mild cough. Shortness of breath is worse with lying supine improved with sitting up. Admits to 3 pillow orthopnea for approximately the past 3 weeks. Denies any chest pain. No other associated symptoms. No treatment prior to coming here. Denies abdominal pain but abdomen feels "bloated" for the past 2 days. Denies any diaphoresis. Past Medical History  Diagnosis Date  . Legally blind   . DM2 (diabetes mellitus, type 2) ([place])   . Hypertension   . Hyperlipidemia   . BPH (benign prostatic hyperplasia)   . Cancer [facility])     colon cancer (malignant polyp) removed  . Spontaneous pneumothorax     2  . Renal insufficiency   . Anemia    Past Surgical History  Procedure Laterality Date  . Polyp removed    . Eye surgery    . Cataract extraction     Family History  Problem Relation Age of Onset  . Cancer Father   . Ulcers Mother    Social History  Substance Use Topics  . Smoking status: Former Smoker -- 0.00 packs/day for 0 years    Types: Cigarettes  . Smokeless tobacco: None     Comment: Quit early 90s  . Alcohol Use: No    Review of Systems  Constitutional: Negative.   HENT: Negative.   Eyes: Positive for visual disturbance.       Legally blind  Respiratory: Positive for cough and shortness of breath.   Cardiovascular: Negative.   Gastrointestinal: Positive for abdominal distention.  Musculoskeletal: Negative.   Skin: Negative.   Allergic/Immunologic: Positive for immunocompromised state.       Diabetic  Neurological: Negative.   Psychiatric/Behavioral: Negative.   All other systems reviewed and are  negative.     Allergies  Review of patient's allergies indicates no known allergies.  Home Medications   Prior to Admission medications   Medication Sig Start Date End Date Taking? Authorizing Provider  amLODipine (NORVASC) 10 MG tablet Take 10 mg by mouth daily.    Historical Provider, MD  aspirin EC 81 MG tablet Take 81 mg by mouth daily.    Historical Provider, MD  furosemide (LASIX) 40 MG tablet Take 1 tablet (40 mg total) by mouth daily. 06/14/15   Donne Hazel, MD  glipiZIDE (GLUCOTROL XL) 10 MG 24 hr tablet Take 10 mg by mouth daily with breakfast.    Historical Provider, MD  insulin glargine (LANTUS) 100 UNIT/ML injection Inject 8 Units into the skin at bedtime.    Historical Provider, MD  iron polysaccharides (NIFEREX) 150 MG capsule Take 150 mg by mouth daily. 05/05/15   Historical Provider, MD  metoprolol tartrate (LOPRESSOR) 25 MG tablet Take 25 mg by mouth 2 (two) times daily.    Historical Provider, MD  pravastatin (PRAVACHOL) 40 MG tablet Take 40 mg by mouth every evening.    Historical Provider, MD  sitaGLIPtin (JANUVIA) 50 MG tablet Take 50 mg by mouth daily.    Historical Provider, MD  tamsulosin (FLOMAX) 0.4 MG CAPS capsule Take 0.8 mg by mouth every evening.    Historical Provider, MD  vitamin C (ASCORBIC ACID) 250 MG tablet Take  250 mg by mouth daily.    Historical Provider, MD   BP 118/69 mmHg  Pulse 88  Temp(Src) 100.7 F (38.2 C) (Oral)  Resp 20  Wt 184 lb 7 oz (83.66 kg)  SpO2 92% Physical Exam  Constitutional: He appears well-developed and well-nourished. No distress.  HENT:  Head: Normocephalic and atraumatic.  Eyes: Conjunctivae and EOM are normal.  Neck: Neck supple. JVD present. No tracheal deviation present. No thyromegaly present.  Cardiovascular: Normal rate and regular rhythm.   No murmur heard. Pulmonary/Chest: Effort normal. He has rales.  Rales at bases bilaterally  Abdominal: Soft. Bowel sounds are normal. He exhibits no distension. There  is no tenderness.  Musculoskeletal: Normal range of motion. He exhibits no edema or tenderness.  Neurological: He is alert. Coordination normal.  Skin: Skin is warm and dry. No rash noted.  Psychiatric: He has a normal mood and affect.  Nursing note and vitals reviewed.   ED Course  Procedures (including critical care time) Labs Review Labs Reviewed  BASIC METABOLIC PANEL  [place], ED    Imaging Review No results found. I have personally reviewed and evaluated these images and lab results as part of my medical decision-making.   EKG Interpretation   Date/Time:  Sunday September 28 2015 21:39:49 EST Ventricular Rate:  92 PR Interval:  144 QRS Duration: 82 QT Interval:  336 QTC Calculation: 415 R Axis:   60 Text Interpretation:  Normal sinus rhythm ST \T\ T wave abnormality,  consider lateral ischemia Abnormal ECG Confirmed by Winfred Leeds  MD, Kristen Fromm  [phone]) on 09/28/2015 9:55:59 PM     Chest x-ray viewed by me Results for orders placed or performed during the hospital encounter of 22/48/25  Basic metabolic panel  Result Value Ref Range   Sodium 138 135 - 145 mmol/L   Potassium 5.0 3.5 - 5.1 mmol/L   Chloride 110 101 - 111 mmol/L   CO2 19 (L) 22 - 32 mmol/L   Glucose, Bld 115 (H) 65 - 99 mg/dL   BUN 54 (H) 6 - 20 mg/dL   Creatinine, Ser 3.22 (H) 0.61 - 1.24 mg/dL   Calcium 8.6 (L) 8.9 - 10.3 mg/dL   GFR calc non Af Amer 19 (L) >60 mL/min   GFR calc Af Amer 22 (L) >60 mL/min   Anion gap 9 5 - 15  CBC  Result Value Ref Range   WBC 11.8 (H) 4.0 - 10.5 K/uL   RBC 3.39 (L) 4.22 - 5.81 MIL/uL   Hemoglobin 10.5 (L) 13.0 - 17.0 g/dL   HCT 32.1 (L) 39.0 - 52.0 %   MCV 94.7 78.0 - 100.0 fL   MCH 31.0 26.0 - 34.0 pg   MCHC 32.7 30.0 - 36.0 g/dL   RDW 14.0 11.5 - 15.5 %   Platelets 231 150 - 400 K/uL  Brain natriuretic peptide  Result Value Ref Range   B Natriuretic Peptide 1281.0 (H) 0.0 - 100.0 pg/mL  I-stat troponin, ED (not at [facility], [facility])   Result Value Ref Range   Troponin i, poc 1.18 (HH) 0.00 - 0.08 ng/mL   Comment NOTIFIED PHYSICIAN    Comment 3           Dg Chest 2 View  09/28/2015  CLINICAL DATA:  Acute onset of worsening shortness of breath on exertion. Dry cough. Abdominal swelling and diaphoresis. Initial encounter. EXAM: CHEST  2 VIEW COMPARISON:  Chest radiograph performed 06/13/2015 FINDINGS: The lungs are well-aerated. Mild  vascular congestion is noted. Mildly increased interstitial markings raise concern for mild interstitial edema, though minimal pneumonia might have a similar appearance. There is no evidence of pleural effusion or pneumothorax. The heart is borderline normal in size. No acute osseous abnormalities are seen. IMPRESSION: Mild vascular congestion noted. Mildly increased interstitial markings raise concern for mild interstitial edema, though minimal pneumonia might have a similar appearance. Electronically Signed   By: Garald Balding M.D.   On: 09/28/2015 22:05    1235 am pt resting comfortably .  MDM  Patient with chronically elevated troponin Final diagnoses:  None   clinically patient in congestive heart failure. Has mildly worsening renal function. Plan treatment with   topical nitrates, intravenous Lasix. Blood cultures urine culture pending. Of note: Nursing attempted to catheterize patient in attempt to obtain urine. Resistance was met on attending a past urinary catheter. He then spontaneously urinated on his own, blood-tinged urine.Renal insufficency and anemia are chronic  Spoke with Dr. Blaine Hamper. Plan admit to telemetry. Diurese, nitrates, culyutres of blood and urine pending Dx #1 CHF #2anemia #3renal insufficiency #4 fever    Orlie Dakin, MD 09/29/15 0254

## 2015-09-28 NOTE — ED Notes (Signed)
Pt. reports progressing SOB with exertion onset last week , occasional dry cough , " bloated" , abdominal swelling and diaphoresis . Denies chest pain at arrival , no fever or chills.

## 2015-09-28 NOTE — ED Notes (Signed)
Jacubowitz, MD at bedside.  

## 2015-09-28 NOTE — ED Notes (Signed)
Patient transported to X-ray 

## 2015-09-29 ENCOUNTER — Inpatient Hospital Stay (HOSPITAL_COMMUNITY): Payer: Medicare HMO

## 2015-09-29 DIAGNOSIS — R7989 Other specified abnormal findings of blood chemistry: Secondary | ICD-10-CM | POA: Diagnosis not present

## 2015-09-29 DIAGNOSIS — I509 Heart failure, unspecified: Secondary | ICD-10-CM | POA: Diagnosis not present

## 2015-09-29 DIAGNOSIS — R748 Abnormal levels of other serum enzymes: Secondary | ICD-10-CM | POA: Diagnosis present

## 2015-09-29 DIAGNOSIS — N184 Chronic kidney disease, stage 4 (severe): Secondary | ICD-10-CM | POA: Diagnosis present

## 2015-09-29 DIAGNOSIS — R0602 Shortness of breath: Secondary | ICD-10-CM | POA: Insufficient documentation

## 2015-09-29 DIAGNOSIS — N400 Enlarged prostate without lower urinary tract symptoms: Secondary | ICD-10-CM | POA: Diagnosis present

## 2015-09-29 DIAGNOSIS — A419 Sepsis, unspecified organism: Secondary | ICD-10-CM | POA: Diagnosis present

## 2015-09-29 DIAGNOSIS — J9601 Acute respiratory failure with hypoxia: Secondary | ICD-10-CM

## 2015-09-29 DIAGNOSIS — J988 Other specified respiratory disorders: Secondary | ICD-10-CM | POA: Diagnosis present

## 2015-09-29 DIAGNOSIS — N179 Acute kidney failure, unspecified: Secondary | ICD-10-CM | POA: Diagnosis present

## 2015-09-29 DIAGNOSIS — Z85038 Personal history of other malignant neoplasm of large intestine: Secondary | ICD-10-CM | POA: Diagnosis not present

## 2015-09-29 DIAGNOSIS — J189 Pneumonia, unspecified organism: Secondary | ICD-10-CM | POA: Diagnosis not present

## 2015-09-29 DIAGNOSIS — E785 Hyperlipidemia, unspecified: Secondary | ICD-10-CM

## 2015-09-29 DIAGNOSIS — I5043 Acute on chronic combined systolic (congestive) and diastolic (congestive) heart failure: Secondary | ICD-10-CM | POA: Diagnosis present

## 2015-09-29 DIAGNOSIS — I5033 Acute on chronic diastolic (congestive) heart failure: Secondary | ICD-10-CM | POA: Diagnosis not present

## 2015-09-29 DIAGNOSIS — I472 Ventricular tachycardia: Secondary | ICD-10-CM | POA: Diagnosis present

## 2015-09-29 DIAGNOSIS — H548 Legal blindness, as defined in USA: Secondary | ICD-10-CM | POA: Diagnosis present

## 2015-09-29 DIAGNOSIS — D649 Anemia, unspecified: Secondary | ICD-10-CM | POA: Diagnosis present

## 2015-09-29 DIAGNOSIS — I5032 Chronic diastolic (congestive) heart failure: Secondary | ICD-10-CM | POA: Insufficient documentation

## 2015-09-29 DIAGNOSIS — I5023 Acute on chronic systolic (congestive) heart failure: Secondary | ICD-10-CM

## 2015-09-29 DIAGNOSIS — J9600 Acute respiratory failure, unspecified whether with hypoxia or hypercapnia: Secondary | ICD-10-CM | POA: Diagnosis present

## 2015-09-29 DIAGNOSIS — I130 Hypertensive heart and chronic kidney disease with heart failure and stage 1 through stage 4 chronic kidney disease, or unspecified chronic kidney disease: Secondary | ICD-10-CM | POA: Diagnosis present

## 2015-09-29 DIAGNOSIS — Z794 Long term (current) use of insulin: Secondary | ICD-10-CM | POA: Diagnosis not present

## 2015-09-29 DIAGNOSIS — Z7982 Long term (current) use of aspirin: Secondary | ICD-10-CM | POA: Diagnosis not present

## 2015-09-29 DIAGNOSIS — B349 Viral infection, unspecified: Secondary | ICD-10-CM | POA: Diagnosis present

## 2015-09-29 DIAGNOSIS — Z87891 Personal history of nicotine dependence: Secondary | ICD-10-CM | POA: Diagnosis not present

## 2015-09-29 DIAGNOSIS — E1122 Type 2 diabetes mellitus with diabetic chronic kidney disease: Secondary | ICD-10-CM

## 2015-09-29 DIAGNOSIS — D631 Anemia in chronic kidney disease: Secondary | ICD-10-CM | POA: Diagnosis present

## 2015-09-29 LAB — LIPID PANEL
CHOL/HDL RATIO: 3.9 ratio
CHOLESTEROL: 139 mg/dL (ref 0–200)
HDL: 36 mg/dL — ABNORMAL LOW (ref >40)
LDL Cholesterol: 92 mg/dL (ref 0–99)
Triglycerides: 57 mg/dL (ref <150)
VLDL: 11 mg/dL (ref 0–40)

## 2015-09-29 LAB — URINALYSIS, ROUTINE W REFLEX MICROSCOPIC
BILIRUBIN URINE: NEGATIVE
Glucose, UA: NEGATIVE mg/dL
Ketones, ur: 15 mg/dL — AB
NITRITE: NEGATIVE
PH: 5 (ref 5.0–8.0)
Protein, ur: 100 mg/dL — AB
SPECIFIC GRAVITY, URINE: 1.015 (ref 1.005–1.030)

## 2015-09-29 LAB — STREP PNEUMONIAE URINARY ANTIGEN
STREP PNEUMO URINARY ANTIGEN: NEGATIVE
Strep Pneumo Urinary Antigen: NEGATIVE

## 2015-09-29 LAB — GLUCOSE, CAPILLARY
GLUCOSE-CAPILLARY: 98 mg/dL (ref 65–99)
GLUCOSE-CAPILLARY: 75 mg/dL (ref 65–99)
GLUCOSE-CAPILLARY: 107 mg/dL — AB (ref 65–99)
Glucose-Capillary: 143 mg/dL — ABNORMAL HIGH (ref 65–99)
Glucose-Capillary: 121 mg/dL — ABNORMAL HIGH (ref 65–99)
Glucose-Capillary: 93 mg/dL (ref 65–99)

## 2015-09-29 LAB — PROCALCITONIN: PROCALCITONIN: 0.26 ng/mL

## 2015-09-29 LAB — BASIC METABOLIC PANEL
Anion gap: 9 (ref 5–15)
BUN: 58 mg/dL — ABNORMAL HIGH (ref 6–20)
CHLORIDE: 107 mmol/L (ref 101–111)
CO2: 23 mmol/L (ref 22–32)
Calcium: 9 mg/dL (ref 8.9–10.3)
Creatinine, Ser: 3.67 mg/dL — ABNORMAL HIGH (ref 0.61–1.24)
GFR calc non Af Amer: 16 mL/min — ABNORMAL LOW (ref >60)
GFR, EST AFRICAN AMERICAN: 18 mL/min — AB (ref >60)
Glucose, Bld: 103 mg/dL — ABNORMAL HIGH (ref 65–99)
POTASSIUM: 4.7 mmol/L (ref 3.5–5.1)
SODIUM: 139 mmol/L (ref 135–145)

## 2015-09-29 LAB — URINE MICROSCOPIC-ADD ON

## 2015-09-29 LAB — INFLUENZA PANEL BY PCR (TYPE A & B)
H1N1 flu by pcr: NOT DETECTED
INFLAPCR: NEGATIVE
INFLBPCR: NEGATIVE

## 2015-09-29 LAB — LACTIC ACID, PLASMA
LACTIC ACID, VENOUS: 1.1 mmol/L (ref 0.5–2.0)
LACTIC ACID, VENOUS: 0.8 mmol/L (ref 0.5–2.0)

## 2015-09-29 LAB — TROPONIN I
TROPONIN I: 0.69 ng/mL — AB (ref <0.031)
Troponin I: 0.74 ng/mL (ref <0.031)
Troponin I: 0.79 ng/mL (ref <0.031)

## 2015-09-29 LAB — BRAIN NATRIURETIC PEPTIDE: B Natriuretic Peptide: 1281 pg/mL — ABNORMAL HIGH (ref 0.0–100.0)

## 2015-09-29 LAB — PROTIME-INR
INR: 1.43 (ref 0.00–1.49)
Prothrombin Time: 17.5 seconds — ABNORMAL HIGH (ref 11.6–15.2)

## 2015-09-29 LAB — TSH: TSH: 6.983 u[IU]/mL — AB (ref 0.350–4.500)

## 2015-09-29 LAB — MAGNESIUM: MAGNESIUM: 2.1 mg/dL (ref 1.7–2.4)

## 2015-09-29 LAB — APTT: APTT: 46 s — AB (ref 24–37)

## 2015-09-29 MED ORDER — SODIUM CHLORIDE 0.9 % IJ SOLN: 3.0000 mL | INTRAMUSCULAR | Status: DC | PRN

## 2015-09-29 MED ORDER — DEXTROSE 5 % IV SOLN
1.0000 g | Freq: Every day | INTRAVENOUS | Status: DC
  Administered 2015-09-29 (×2): 1 g via INTRAVENOUS
  Filled 2015-09-29 (×3): qty 10

## 2015-09-29 MED ORDER — PRAVASTATIN SODIUM 40 MG PO TABS
40.0000 mg | ORAL_TABLET | Freq: Every evening | ORAL | Status: DC
  Administered 2015-09-29 – 2015-10-01 (×3): 40 mg via ORAL
  Filled 2015-09-29 (×3): qty 1

## 2015-09-29 MED ORDER — METOPROLOL TARTRATE 25 MG PO TABS
25.0000 mg | ORAL_TABLET | Freq: Two times a day (BID) | ORAL | Status: DC
  Administered 2015-09-29 – 2015-10-02 (×8): 25 mg via ORAL
  Filled 2015-09-29 (×8): qty 1

## 2015-09-29 MED ORDER — TAMSULOSIN HCL 0.4 MG PO CAPS
0.8000 mg | ORAL_CAPSULE | Freq: Every evening | ORAL | Status: DC
  Administered 2015-09-29 – 2015-10-01 (×3): 0.8 mg via ORAL
  Filled 2015-09-29 (×3): qty 2

## 2015-09-29 MED ORDER — FUROSEMIDE 10 MG/ML IJ SOLN
40.0000 mg | Freq: Two times a day (BID) | INTRAMUSCULAR | Status: DC
  Administered 2015-09-29 – 2015-10-01 (×4): 40 mg via INTRAVENOUS
  Filled 2015-09-29 (×4): qty 4

## 2015-09-29 MED ORDER — HYDRALAZINE HCL 20 MG/ML IJ SOLN: 5.0000 mg | INTRAMUSCULAR | Status: DC | PRN

## 2015-09-29 MED ORDER — DM-GUAIFENESIN ER 30-600 MG PO TB12
1.0000 | ORAL_TABLET | Freq: Two times a day (BID) | ORAL | Status: DC
  Administered 2015-09-29 – 2015-10-02 (×8): 1 via ORAL
  Filled 2015-09-29 (×8): qty 1

## 2015-09-29 MED ORDER — SODIUM CHLORIDE 0.9 % IJ SOLN
3.0000 mL | Freq: Two times a day (BID) | INTRAMUSCULAR | Status: DC
  Administered 2015-09-29 – 2015-10-02 (×8): 3 mL via INTRAVENOUS

## 2015-09-29 MED ORDER — ALBUTEROL SULFATE (2.5 MG/3ML) 0.083% IN NEBU: 2.5000 mg | INHALATION_SOLUTION | RESPIRATORY_TRACT | Status: DC | PRN

## 2015-09-29 MED ORDER — INSULIN GLARGINE 100 UNIT/ML ~~LOC~~ SOLN
6.0000 [IU] | Freq: Every day | SUBCUTANEOUS | Status: DC
  Administered 2015-09-29 – 2015-10-01 (×3): 6 [IU] via SUBCUTANEOUS
  Filled 2015-09-29 (×5): qty 0.06

## 2015-09-29 MED ORDER — INSULIN ASPART 100 UNIT/ML ~~LOC~~ SOLN
0.0000 [IU] | Freq: Three times a day (TID) | SUBCUTANEOUS | Status: DC
  Administered 2015-09-29 – 2015-10-01 (×4): 1 [IU] via SUBCUTANEOUS
  Administered 2015-10-01: 2 [IU] via SUBCUTANEOUS

## 2015-09-29 MED ORDER — NITROGLYCERIN 2 % TD OINT
1.0000 [in_us] | TOPICAL_OINTMENT | Freq: Once | TRANSDERMAL | Status: DC
  Filled 2015-09-29: qty 1

## 2015-09-29 MED ORDER — VITAMIN C 250 MG PO TABS
250.0000 mg | ORAL_TABLET | Freq: Every day | ORAL | Status: DC
  Administered 2015-09-29 – 2015-10-02 (×4): 250 mg via ORAL
  Filled 2015-09-29 (×4): qty 1

## 2015-09-29 MED ORDER — ACETAMINOPHEN 325 MG PO TABS
650.0000 mg | ORAL_TABLET | ORAL | Status: DC | PRN
  Administered 2015-09-29: 650 mg via ORAL
  Filled 2015-09-29: qty 2

## 2015-09-29 MED ORDER — SODIUM CHLORIDE 0.9 % IV SOLN: 250.0000 mL | INTRAVENOUS | Status: DC | PRN

## 2015-09-29 MED ORDER — ASPIRIN EC 81 MG PO TBEC
81.0000 mg | DELAYED_RELEASE_TABLET | Freq: Every day | ORAL | Status: DC
  Administered 2015-09-29 – 2015-10-02 (×4): 81 mg via ORAL
  Filled 2015-09-29 (×4): qty 1

## 2015-09-29 MED ORDER — POLYSACCHARIDE IRON COMPLEX 150 MG PO CAPS
150.0000 mg | ORAL_CAPSULE | Freq: Every day | ORAL | Status: DC
  Administered 2015-09-29 – 2015-10-02 (×4): 150 mg via ORAL
  Filled 2015-09-29 (×4): qty 1

## 2015-09-29 MED ORDER — ONDANSETRON HCL 4 MG/2ML IJ SOLN: 4.0000 mg | Freq: Four times a day (QID) | INTRAMUSCULAR | Status: DC | PRN

## 2015-09-29 MED ORDER — FUROSEMIDE 10 MG/ML IJ SOLN: 40.0000 mg | Freq: Every day | INTRAMUSCULAR | Status: DC

## 2015-09-29 MED ORDER — FUROSEMIDE 10 MG/ML IJ SOLN
80.0000 mg | Freq: Once | INTRAMUSCULAR | Status: AC
  Administered 2015-09-29: 80 mg via INTRAVENOUS

## 2015-09-29 MED ORDER — HEPARIN SODIUM (PORCINE) 5000 UNIT/ML IJ SOLN
5000.0000 [IU] | Freq: Three times a day (TID) | INTRAMUSCULAR | Status: DC
  Administered 2015-09-29 – 2015-10-02 (×10): 5000 [IU] via SUBCUTANEOUS
  Filled 2015-09-29 (×10): qty 1

## 2015-09-29 MED ORDER — DEXTROSE 5 % IV SOLN
500.0000 mg | INTRAVENOUS | Status: DC
  Administered 2015-09-29 – 2015-09-30 (×2): 500 mg via INTRAVENOUS
  Filled 2015-09-29 (×2): qty 500

## 2015-09-29 MED ORDER — FUROSEMIDE 10 MG/ML IJ SOLN
40.0000 mg | Freq: Once | INTRAMUSCULAR | Status: AC
  Administered 2015-09-29: 40 mg via INTRAVENOUS
  Filled 2015-09-29: qty 4

## 2015-09-29 NOTE — ED Notes (Signed)
Attempted report X1

## 2015-09-29 NOTE — ED Notes (Signed)
Attempted to in-and-out cath with [facility] EMT, resistance met and was not able to collect urine sample

## 2015-09-29 NOTE — Progress Notes (Signed)
Echocardiogram 2D Echocardiogram has been performed.  Troy Young 09/29/2015, 9:17 AM

## 2015-09-29 NOTE — Progress Notes (Signed)
Utilization review complete. Natisha Trzcinski RN CCM Case Mgmt phone [phone] 

## 2015-09-29 NOTE — Plan of Care (Signed)
Problem: Acute Rehab PT Goals(only PT should resolve) Goal: Pt Will Go Supine/Side To Sit With flat bed

## 2015-09-29 NOTE — Evaluation (Signed)
Physical Therapy Evaluation Patient Details Name: Troy Young MRN: [id number] DOB: [birth year] Today's Date: 09/29/2015   History of Present Illness  64 yo male with onset of acute respiratory failure and PNA, has sepsis with leukocytosis and EF 50-55%.  His PMHx:  legally blind, CKD 4,   Clinical Impression  Pt has been in bed with recent health changes and noted minimal loss of independence with movement.  He is likely just going to need some time to increase endurance and will not anticipate follow up with therapy unless he is much more incapacitated during his stay.    Follow Up Recommendations No PT follow up    Equipment Recommendations  None recommended by PT    Recommendations for Other Services       Precautions / Restrictions Precautions Precautions: Fall (telemetry) Restrictions Weight Bearing Restrictions: No      Mobility  Bed Mobility Overal bed mobility: Modified Independent             General bed mobility comments: HOB elevated  Transfers Overall transfer level: Modified independent Equipment used:  (cane for trailing)             General transfer comment: uses legs to place himself  Ambulation/Gait Ambulation/Gait assistance: Min guard (verbal cues) Ambulation Distance (Feet): 100 Feet Assistive device: 1 person hand held assist (guarding) Gait Pattern/deviations: Decreased stride length;Narrow base of support;Drifts right/left (follows PT cues well) Gait velocity: reduced Gait velocity interpretation: Below normal speed for age/gender General Gait Details: has limited vision to assist navigation  Stairs            Wheelchair Mobility    Modified Rankin (Stroke Patients Only)       Balance Overall balance assessment: Needs assistance Sitting-balance support: Feet supported Sitting balance-Leahy Scale: Good   Postural control: Posterior lean Standing balance support: Single extremity supported Standing balance-Leahy  Scale: Fair Standing balance comment: Pt feeling tired and a little weak from medical issues                             Pertinent Vitals/Pain Pain Assessment: No/denies pain    Home Living Family/patient expects to be discharged to:: Private residence Living Arrangements: Other relatives Available Help at Discharge: Family;Available 24 hours/day Type of Home: Apartment Home Access: Stairs to enter Entrance Stairs-Rails: None Entrance Stairs-Number of Steps: 2 Home Layout: One level Home Equipment:  (cane for trailing with vision loss)      Prior Function Level of Independence: Independent with assistive device(s)         Comments: amb with blind cane I'ly     Hand Dominance        Extremity/Trunk Assessment   Upper Extremity Assessment: Overall WFL for tasks assessed           Lower Extremity Assessment: Overall WFL for tasks assessed      Cervical / Trunk Assessment: Normal  Communication   Communication: No difficulties  Cognition Arousal/Alertness: Awake/alert Behavior During Therapy: WFL for tasks assessed/performed Overall Cognitive Status: Within Functional Limits for tasks assessed                      General Comments General comments (skin integrity, edema, etc.): Has poor control of his environment due to lack of familiarity but follows cues from PT well    Exercises        Assessment/Plan    PT Assessment Patient needs continued PT  services  PT Diagnosis Generalized weakness   PT Problem List Decreased activity tolerance;Decreased balance;Decreased mobility;Cardiopulmonary status limiting activity  PT Treatment Interventions DME instruction;Gait training;Stair training;Functional mobility training;Therapeutic activities;Neuromuscular re-education;Balance training;Therapeutic exercise;Patient/family education   PT Goals (Current goals can be found in the Care Plan section) Acute Rehab PT Goals Patient Stated Goal: to  get a nap PT Goal Formulation: With patient/family Time For Goal Achievement: 10/13/15 Potential to Achieve Goals: Good    Frequency Min 2X/week   Barriers to discharge Other (comment) (has his wife at home 24/7)      Co-evaluation               End of Session Equipment Utilized During Treatment: Gait belt;Oxygen Activity Tolerance: Patient tolerated treatment well Patient left: in bed;with call bell/phone within reach;with bed alarm set;with family/visitor present (too tired to sit OOB) Nurse Communication: Mobility status         Time: [id number] PT Time Calculation (min) (ACUTE ONLY): 28 min   Charges:   PT Evaluation $Initial PT Evaluation Tier I: 1 Procedure PT Treatments $Gait Training: 8-22 mins   PT G CodesRamond Dial 28-Oct-2015, 11:42 AM   Mee Hives, PT MS Acute Rehab Dept. Number: [facility] [phone] and [place] [phone]

## 2015-09-29 NOTE — H&P (Signed)
[facility] History and Physical  Troy Young [id number] DOB: [birth year] DOA: 09/28/2015  Referring physician: ED physician PCP: [name], MD  Specialists:   Chief Complaint: shortness of breath  HPI: Troy Young is a 64 y.o. male with PMH of hypertension, hyperlipidemia, diabetes mellitus, legally blinded, BPH, colon cancer, CKD-IV, chronic anemia, diastolic congestive heart failure (EF 50-55 percent with grade 2 diastolic dysfunction), who presents with shortness of breath.  Patient reports that he has been having shortness of breath for about one week, which has been progressively getting worse in the past 2 days. It is aggravated by exertion. He has orthopnea and abdominal swelling. His body weight has increased by 9 pounds since August per pt. Patient has dry cough, mild fever and chills. No chest pain. Patient does not have abdominal pain, diarrhea, symptoms of UTI, unilateral weakness.  In ED, patient was found to have BNP 1281, troponin 1.18 which was 0.89 on 06/11/15, pending urinalysis, WBC 11.8, temperature 100.7, tachypnea, no tachycardia. Renal function is close to baseline. Chest x-ray showed mild vascular congestion, mildly increased interstitial markings raise concern for mild interstitial edema, though minimal pneumonia might have a similar appearance. Patient is admitted to inpatient for further eval and treatment.  Where does patient live?   At home   Can patient participate in ADLs?  barely  Review of Systems:   General: has fevers, chills, no changes in body weight, has poor appetite, has fatigue HEENT: no hearing changes or sore throat Pulm: has dyspnea, coughing, no wheezing CV: no chest pain, palpitations Abd: no nausea, vomiting, abdominal pain, diarrhea, constipation GU: no dysuria, burning on urination, increased urinary frequency, hematuria  Ext: has leg edema Neuro: no unilateral weakness, numbness, or tingling, no vision change  or hearing loss Skin: no rash MSK: No muscle spasm, no deformity, no limitation of range of movement in spin Heme: No easy bruising.  Travel history: No recent long distant travel.  Allergy: No Known Allergies  Past Medical History  Diagnosis Date  . Legally blind   . DM2 (diabetes mellitus, type 2) ([place])   . Hypertension   . Hyperlipidemia   . BPH (benign prostatic hyperplasia)   . Cancer [facility])     colon cancer (malignant polyp) removed  . Spontaneous pneumothorax     2  . Renal insufficiency   . Anemia     Past Surgical History  Procedure Laterality Date  . Polyp removed    . Eye surgery    . Cataract extraction      Social History:  reports that he has quit smoking. His smoking use included Cigarettes. He smoked 0.00 packs per day for 0 years. He does not have any smokeless tobacco history on file. He reports that he does not drink alcohol. His drug history is not on file.  Family History:  Family History  Problem Relation Age of Onset  . Cancer Father   . Ulcers Mother      Prior to Admission medications   Medication Sig Start Date End Date Taking? Authorizing Provider  amLODipine (NORVASC) 10 MG tablet Take 10 mg by mouth daily.   Yes Historical Provider, MD  aspirin EC 81 MG tablet Take 81 mg by mouth daily.   Yes Historical Provider, MD  furosemide (LASIX) 40 MG tablet Take 1 tablet (40 mg total) by mouth daily. 06/14/15  Yes Donne Hazel, MD  glipiZIDE (GLUCOTROL XL) 10 MG 24 hr tablet Take 10 mg by mouth daily with  breakfast.   Yes Historical Provider, MD  insulin glargine (LANTUS) 100 UNIT/ML injection Inject 8 Units into the skin at bedtime.   Yes Historical Provider, MD  iron polysaccharides (NIFEREX) 150 MG capsule Take 150 mg by mouth daily. 05/05/15  Yes Historical Provider, MD  metoprolol tartrate (LOPRESSOR) 25 MG tablet Take 25 mg by mouth 2 (two) times daily.   Yes Historical Provider, MD  pravastatin (PRAVACHOL) 40 MG tablet Take 40 mg by mouth every  evening.   Yes Historical Provider, MD  sitaGLIPtin (JANUVIA) 50 MG tablet Take 50 mg by mouth daily.   Yes Historical Provider, MD  tamsulosin (FLOMAX) 0.4 MG CAPS capsule Take 0.8 mg by mouth every evening.   Yes Historical Provider, MD  vitamin C (ASCORBIC ACID) 250 MG tablet Take 250 mg by mouth daily.   Yes Historical Provider, MD    Physical Exam: Filed Vitals:   09/29/15 0000 09/29/15 0015 09/29/15 0045 09/29/15 0115  BP: 105/67 115/67 108/62 114/65  Pulse: 85 89 93 97  Temp:      TempSrc:      Resp: 27 38 24 34  Weight:      SpO2: 95% 91% 92%    General: Not in acute distress HEENT:       Eyes: no scleral icterus.       ENT: No discharge from the ears and nose, no pharynx injection, no tonsillar enlargement.        Neck: positive JVD, no bruit, no mass felt. Heme: No neck lymph node enlargement. Cardiac: S1/S2, RRR, No murmurs, No gallops or rubs. Pulm: No rales, wheezing, rhonchi or rubs. Abd: Soft, nondistended, nontender, no rebound pain, no organomegaly, BS present. Ext: 1+ pitting leg edema bilaterally. 2+DP/PT pulse bilaterally. Musculoskeletal: No joint deformities, No joint redness or warmth, no limitation of ROM in spin. Skin: No rashes.  Neuro: Alert, oriented X3, cranial nerves II-XII grossly intact, muscle strength 5/5 in all extremities, sensation to light touch intact.  Psych: Patient is not psychotic, no suicidal or hemocidal ideation.  Labs on Admission:  Basic Metabolic Panel:  Recent Labs Lab 09/28/15 2158  NA 138  K 5.0  CL 110  CO2 19*  GLUCOSE 115*  BUN 54*  CREATININE 3.22*  CALCIUM 8.6*   Liver Function Tests: No results for input(s): AST, ALT, ALKPHOS, BILITOT, PROT, ALBUMIN in the last 168 hours. No results for input(s): LIPASE, AMYLASE in the last 168 hours. No results for input(s): AMMONIA in the last 168 hours. CBC:  Recent Labs Lab 09/28/15 2158  WBC 11.8*  HGB 10.5*  HCT 32.1*  MCV 94.7  PLT 231   Cardiac Enzymes: No  results for input(s): CKTOTAL, CKMB, CKMBINDEX, TROPONINI in the last 168 hours.  BNP (last 3 results)  Recent Labs  05/07/15 0212 06/10/15 1711 09/28/15 2251  BNP 303.6* 810.7* 1281.0*    ProBNP (last 3 results) No results for input(s): PROBNP in the last 8760 hours.  CBG: No results for input(s): GLUCAP in the last 168 hours.  Radiological Exams on Admission: Dg Chest 2 View  09/28/2015  CLINICAL DATA:  Acute onset of worsening shortness of breath on exertion. Dry cough. Abdominal swelling and diaphoresis. Initial encounter. EXAM: CHEST  2 VIEW COMPARISON:  Chest radiograph performed 06/13/2015 FINDINGS: The lungs are well-aerated. Mild vascular congestion is noted. Mildly increased interstitial markings raise concern for mild interstitial edema, though minimal pneumonia might have a similar appearance. There is no evidence of pleural effusion or pneumothorax. The  heart is borderline normal in size. No acute osseous abnormalities are seen. IMPRESSION: Mild vascular congestion noted. Mildly increased interstitial markings raise concern for mild interstitial edema, though minimal pneumonia might have a similar appearance. Electronically Signed   By: Garald Balding M.D.   On: 09/28/2015 22:05    EKG: Independently reviewed. QTC 415, mild ST elevation in V5-V6, and in lateral leads, which is similar, but may be slightly worse than previous EKG on 06/10/15.    Assessment/Plan Principal Problem:   Acute respiratory failure (HCC) Active Problems:   CAP (community acquired pneumonia)   DM2 (diabetes mellitus, type 2) ([place])   CKD stage 4 due to type 2 diabetes mellitus (HCC)   Elevated troponin   Shortness of breath   HLD (hyperlipidemia)   BPH (benign prostatic hyperplasia)   Chronic anemia   CHF (congestive heart failure) (HCC)   Acute on chronic systolic congestive heart failure (HCC)   Sepsis (HCC)  Acute respiratory failure ([place]): Patient's shortness of breath is likely due to  combination of CHF exacerbation and possible pneumonia. Patient has elevated BNP, positive JVD, increased body weight and 1+ leg edema, all of which are consistent with CHF exacerbation. He also has cough, fever, chills. Chest x-ray cannot rule out pneumonia. Patient meets criteria for sepsis with leukocytosis, fever, tachypnea. Hemodynamically stable on admission. We are in difficult situation where sepsis needs to b be treated with IV fluid and congestive heart failure needs to be treated with diuretics. One doe of IV lasix was given by EDP, 40 mg 1. Will hold diuretics and trend lactate level now.  -Will admit to tele bed -treat CHF exacerbation as below -Treat sepsis and possible pneumonia as below  Acute on chronic diastolic congestive heart failure: 2-D echo on 05/07/15 showed EF 50-55% with grade 2 diastolic dysfunction, now presents with worsening CHF in the setting of sepsis. Difficult situation. -Lasix 40 mg x 1 was given by ED. Will hold diuretics for now due to sepsis. -trop x 3 -2d echo -Risk factor stratification: A1c, FLP, TSH -will continue home metoprolol, ASA -Daily weights -strict I/O's -Low salt diet -No lisinopril due to worsening renal function.  Sepsis due to possible pneumonia: --IV Rocephin and azithromycin - Mucinex for cough  - Albuterol Neb prn for SOB - Urine legionella and S. pneumococcal antigen - Follow up blood culture x2, sputum culture, plus Flu pcr - will get Procalcitonin and trend lactic acid level per sepsis protocol - IVF: Hold off IV fluid due to CHF exacerbation. - will give gentle IVF if lactate is elevated.  HTN: -Hold amlodipine since patient is at risk of developing hypotension due to sepsis -Continue Coreg -Also on Flomax which is for BPH -Hydralazine when necessary  DM-II: Last A1c 5.0 on 06/11/15, well controled. Patient is taking Lantus, glipizide and Januvia home -will decrease Lantus dose from  8 units to 6 units daily -SSI -Check  A1c  CKD stage 4 due to type 2 diabetes mellitus [facility]): Baseline creatinine 2.8-3.3, her creatinine is 3.22, BUN 56, which is close to baseline. -follow-up by BMP  Elevated troponin: Creatinine 1.18 on admission. This is a chronic issue. Patient troponin has been chronically elevated. Last troponin is 0.89 on 06/11/15. He had Myoview test on 07/17/15 which showed EF 45% with intermittent risk. Currently patient does not have chest pain. Slight elevation from baseline is likely due to demanding ischemia secondary to CHF and sepsis.  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 -Follow-up with a CBC  DVT ppx: SQ Heparin  Code Status: Full code Family Communication:  Yes, patient's sister bed side Disposition Plan: Admit to inpatient   Date of Service 09/29/2015    Ivor Costa [facility] Pager [phone]  If 7PM-7AM, please contact night-coverage [email or web address] Password [email or web address] 09/29/2015, 1:30 AM

## 2015-09-29 NOTE — Progress Notes (Signed)
Pt had 6 beats of V-Tach. Pt resting, HR 80, no complaints of chest pain. MD paged. Will continue to monitor.

## 2015-09-29 NOTE — ED Notes (Signed)
Pt placed on 2L O2 

## 2015-09-29 NOTE — Consult Note (Signed)
Cardiologists:  Troy Young Reason for Consult:  CHF with elevated troponin Referring Physician:   [name] is an 64 y.o. male.  HPI:   The patient is a 64 year old male with history of chronic diastolic heart failure,  diabetes mellitus, hypertension, hyperlipidemia, colon cancer, CKD IV and anemia. He is legally blind. 2-D echocardiogram June 2016 revealed an ejection fraction of 50-55% with akinesis of the mid anterior and anteroseptal and apical anterior and septal walls, with grade 2 diastolic dysfunction mild MR and moderate tricuspid regurgitation peak PA pressure of 61 mmHg. This echo was completed and the setting of a mildly elevated troponin.  He then had a nuclear stress test September 2016 which was considered intermediate risk, with a large, severe, partially reversible defect in the mid distal anterior wall, distal inferior wall and apex. Mild to moderate peri-infarct ischemia with an EF calculated at 46%.   In the absence of chest pain was decided to manage him medically.   The patient presents with dyspnea that has been getting progressively worse over the last several days. His weight is also been going up.  He was not aware that he should call the office if his weight continued to increase.  His weight 07/01/15 was 171 and yesterday it was 184.  Troponin is 0.79.    + orthopnea, PND.  The patient currently denies nausea, vomiting, fever, chest pain, dizziness, cough, congestion, abdominal pain, hematochezia, melena, lower extremity edema, claudication.    Filed Weights   09/28/15 2144 09/29/15 0221  Weight: 184 lb 7 oz (83.66 kg) 179 lb (81.194 kg)     Past Medical History  Diagnosis Date  . Legally blind   . DM2 (diabetes mellitus, type 2) ([place])   . Hypertension   . Hyperlipidemia   . BPH (benign prostatic hyperplasia)   . Cancer [facility])     colon cancer (malignant polyp) removed  . Spontaneous pneumothorax     2  . Renal insufficiency   . Anemia     Past Surgical  History  Procedure Laterality Date  . Polyp removed    . Eye surgery    . Cataract extraction      Family History  Problem Relation Age of Onset  . Cancer Father   . Ulcers Mother     Social History:  reports that he has quit smoking. His smoking use included Cigarettes. He smoked 0.00 packs per day for 0 years. He does not have any smokeless tobacco history on file. He reports that he does not drink alcohol. His drug history is not on file.  Allergies: No Known Allergies  Medications:  Scheduled Meds: . aspirin EC  81 mg Oral Daily  . azithromycin  500 mg Intravenous Q24H  . cefTRIAXone (ROCEPHIN)  IV  1 g Intravenous QHS  . dextromethorphan-guaiFENesin  1 tablet Oral BID  . heparin  5,000 Units Subcutaneous 3 times per day  . insulin aspart  0-9 Units Subcutaneous TID WC  . insulin glargine  6 Units Subcutaneous QHS  . iron polysaccharides  150 mg Oral Daily  . metoprolol tartrate  25 mg Oral BID  . pravastatin  40 mg Oral QPM  . sodium chloride  3 mL Intravenous Q12H  . tamsulosin  0.8 mg Oral QPM  . vitamin C  250 mg Oral Daily   Continuous Infusions:  PRN Meds:.sodium chloride, acetaminophen, albuterol, hydrALAZINE, ondansetron (ZOFRAN) IV, sodium chloride   Results for orders placed or performed during the hospital encounter of  09/28/15 (from the past 48 hour(s))  I-stat troponin, ED (not at [facility])     Status: Abnormal   Collection Time: 09/28/15  9:49 PM  Result Value Ref Range   Troponin i, poc 1.18 (HH) 0.00 - 0.08 ng/mL   Comment NOTIFIED PHYSICIAN    Comment 3            Comment: Due to the release kinetics of cTnI, a negative result within the first hours of the onset of symptoms does not rule out myocardial infarction with certainty. If myocardial infarction is still suspected, repeat the test at appropriate intervals.   Basic metabolic panel     Status: Abnormal   Collection Time: 09/28/15  9:58 PM  Result Value Ref Range   Sodium 138 135 - 145  mmol/L   Potassium 5.0 3.5 - 5.1 mmol/L   Chloride 110 101 - 111 mmol/L   CO2 19 (L) 22 - 32 mmol/L   Glucose, Bld 115 (H) 65 - 99 mg/dL   BUN 54 (H) 6 - 20 mg/dL   Creatinine, Ser 3.22 (H) 0.61 - 1.24 mg/dL   Calcium 8.6 (L) 8.9 - 10.3 mg/dL   GFR calc non Af Amer 19 (L) >60 mL/min   GFR calc Af Amer 22 (L) >60 mL/min    Comment: (NOTE) The eGFR has been calculated using the CKD EPI equation. This calculation has not been validated in all clinical situations. eGFR's persistently <60 mL/min signify possible Chronic Kidney Disease.    Anion gap 9 5 - 15  CBC     Status: Abnormal   Collection Time: 09/28/15  9:58 PM  Result Value Ref Range   WBC 11.8 (H) 4.0 - 10.5 K/uL   RBC 3.39 (L) 4.22 - 5.81 MIL/uL   Hemoglobin 10.5 (L) 13.0 - 17.0 g/dL   HCT 32.1 (L) 39.0 - 52.0 %   MCV 94.7 78.0 - 100.0 fL   MCH 31.0 26.0 - 34.0 pg   MCHC 32.7 30.0 - 36.0 g/dL   RDW 14.0 11.5 - 15.5 %   Platelets 231 150 - 400 K/uL  Blood culture (routine x 2)     Status: None (Preliminary result)   Collection Time: 09/28/15 10:40 PM  Result Value Ref Range   Specimen Description BLOOD RIGHT ANTECUBITAL    Special Requests BOTTLES DRAWN AEROBIC AND ANAEROBIC 5CC     Culture NO GROWTH < 12 HOURS    Report Status PENDING   Blood culture (routine x 2)     Status: None (Preliminary result)   Collection Time: 09/28/15 10:44 PM  Result Value Ref Range   Specimen Description BLOOD RIGHT FOREARM    Special Requests BOTTLES DRAWN AEROBIC AND ANAEROBIC 5CC     Culture NO GROWTH < 12 HOURS    Report Status PENDING   Brain natriuretic peptide     Status: Abnormal   Collection Time: 09/28/15 10:51 PM  Result Value Ref Range   B Natriuretic Peptide 1281.0 (H) 0.0 - 100.0 pg/mL  Urinalysis, Routine w reflex microscopic (not at [facility])     Status: Abnormal   Collection Time: 09/29/15 12:14 AM  Result Value Ref Range   Color, Urine RED (A) YELLOW    Comment: BIOCHEMICALS MAY BE AFFECTED BY COLOR   APPearance  CLOUDY (A) CLEAR   Specific Gravity, Urine 1.015 1.005 - 1.030   pH 5.0 5.0 - 8.0   Glucose, UA NEGATIVE NEGATIVE mg/dL   Hgb urine dipstick LARGE (A) NEGATIVE  Bilirubin Urine NEGATIVE NEGATIVE   Ketones, ur 15 (A) NEGATIVE mg/dL   Protein, ur 100 (A) NEGATIVE mg/dL   Nitrite NEGATIVE NEGATIVE   Leukocytes, UA SMALL (A) NEGATIVE  Urine microscopic-add on     Status: Abnormal   Collection Time: 09/29/15 12:14 AM  Result Value Ref Range   Squamous Epithelial / LPF 0-5 (A) NONE SEEN    Comment: Please note change in reference range.   WBC, UA 0-5 0 - 5 WBC/hpf    Comment: Please note change in reference range.   RBC / HPF TOO NUMEROUS TO COUNT 0 - 5 RBC/hpf    Comment: Please note change in reference range.   Bacteria, UA RARE (A) NONE SEEN    Comment: Please note change in reference range.  Protime-INR     Status: Abnormal   Collection Time: 09/29/15  1:04 AM  Result Value Ref Range   Prothrombin Time 17.5 (H) 11.6 - 15.2 seconds   INR 1.43 0.00 - 1.49  APTT     Status: Abnormal   Collection Time: 09/29/15  1:04 AM  Result Value Ref Range   aPTT 46 (H) 24 - 37 seconds    Comment:        IF BASELINE aPTT IS ELEVATED, SUGGEST PATIENT RISK ASSESSMENT BE USED TO DETERMINE APPROPRIATE ANTICOAGULANT THERAPY.   Procalcitonin     Status: None   Collection Time: 09/29/15  1:04 AM  Result Value Ref Range   Procalcitonin 0.26 ng/mL    Comment:        Interpretation: PCT (Procalcitonin) <= 0.5 ng/mL: Systemic infection (sepsis) is not likely. Local bacterial infection is possible. (NOTE)         ICU PCT Algorithm               Non ICU PCT Algorithm    ----------------------------     ------------------------------         PCT < 0.25 ng/mL                 PCT < 0.1 ng/mL     Stopping of antibiotics            Stopping of antibiotics       strongly encouraged.               strongly encouraged.    ----------------------------     ------------------------------       PCT level  decrease by               PCT < 0.25 ng/mL       >= 80% from peak PCT       OR PCT 0.25 - 0.5 ng/mL          Stopping of antibiotics                                             encouraged.     Stopping of antibiotics           encouraged.    ----------------------------     ------------------------------       PCT level decrease by              PCT >= 0.25 ng/mL       < 80% from peak PCT        AND PCT >= 0.5 ng/mL  Continuin g antibiotics                                              encouraged.       Continuing antibiotics            encouraged.    ----------------------------     ------------------------------     PCT level increase compared          PCT > 0.5 ng/mL         with peak PCT AND          PCT >= 0.5 ng/mL             Escalation of antibiotics                                          strongly encouraged.      Escalation of antibiotics        strongly encouraged.   Glucose, capillary     Status: None   Collection Time: 09/29/15  2:23 AM  Result Value Ref Range   Glucose-Capillary 98 65 - 99 mg/dL  Lipid panel     Status: Abnormal   Collection Time: 09/29/15  2:47 AM  Result Value Ref Range   Cholesterol 139 0 - 200 mg/dL   Triglycerides 57 <150 mg/dL   HDL 36 (L) >40 mg/dL   Total CHOL/HDL Ratio 3.9 RATIO   VLDL 11 0 - 40 mg/dL   LDL Cholesterol 92 0 - 99 mg/dL    Comment:        Total Cholesterol/HDL:CHD Risk Coronary Heart Disease Risk Table                     Men   Women  1/2 Average Risk   3.4   3.3  Average Risk       5.0   4.4  2 X Average Risk   9.6   7.1  3 X Average Risk  23.4   11.0        Use the calculated Patient Ratio above and the CHD Risk Table to determine the patient's CHD Risk.        ATP III CLASSIFICATION (LDL):  <100     mg/dL   Optimal  100-129  mg/dL   Near or Above                    Optimal  130-159  mg/dL   Borderline  160-189  mg/dL   High  >190     mg/dL   Very High   TSH     Status: Abnormal   Collection  Time: 09/29/15  2:47 AM  Result Value Ref Range   TSH 6.983 (H) 0.350 - 4.500 uIU/mL  Lactic acid, plasma     Status: None   Collection Time: 09/29/15  2:47 AM  Result Value Ref Range   Lactic Acid, Venous 1.1 0.5 - 2.0 mmol/L  Troponin I (q 6hr x 3)     Status: Abnormal   Collection Time: 09/29/15  2:47 AM  Result Value Ref Range   Troponin I 0.74 (HH) <0.031 ng/mL    Comment:        POSSIBLE MYOCARDIAL ISCHEMIA. SERIAL TESTING RECOMMENDED. CRITICAL  VALUE NOTED.  VALUE IS CONSISTENT WITH PREVIOUSLY REPORTED AND CALLED VALUE.   Influenza panel by PCR (type A & B, H1N1)     Status: None   Collection Time: 09/29/15  2:48 AM  Result Value Ref Range   Influenza A By PCR NEGATIVE NEGATIVE   Influenza B By PCR NEGATIVE NEGATIVE   H1N1 flu by pcr NOT DETECTED NOT DETECTED    Comment:        The Xpert Flu assay (FDA approved for nasal aspirates or washes and nasopharyngeal swab specimens), is intended as an aid in the diagnosis of influenza and should not be used as a sole basis for treatment.   Strep pneumoniae urinary antigen     Status: None   Collection Time: 09/29/15  4:41 AM  Result Value Ref Range   Strep Pneumo Urinary Antigen NEGATIVE NEGATIVE    Comment:        Infection due to S. pneumoniae cannot be absolutely ruled out since the antigen present may be below the detection limit of the test.   Lactic acid, plasma     Status: None   Collection Time: 09/29/15  5:35 AM  Result Value Ref Range   Lactic Acid, Venous 0.8 0.5 - 2.0 mmol/L  Glucose, capillary     Status: Abnormal   Collection Time: 09/29/15  6:36 AM  Result Value Ref Range   Glucose-Capillary 143 (H) 65 - 99 mg/dL   Comment 1 Notify RN    Comment 2 Document in Chart   Troponin I (q 6hr x 3)     Status: Abnormal   Collection Time: 09/29/15  9:53 AM  Result Value Ref Range   Troponin I 0.79 (HH) <0.031 ng/mL    Comment:        POSSIBLE MYOCARDIAL ISCHEMIA. SERIAL TESTING RECOMMENDED. CRITICAL  VALUE NOTED.  VALUE IS CONSISTENT WITH PREVIOUSLY REPORTED AND CALLED VALUE.   Glucose, capillary     Status: Abnormal   Collection Time: 09/29/15 11:22 AM  Result Value Ref Range   Glucose-Capillary 121 (H) 65 - 99 mg/dL    Dg Chest 2 View  09/28/2015  CLINICAL DATA:  Acute onset of worsening shortness of breath on exertion. Dry cough. Abdominal swelling and diaphoresis. Initial encounter. EXAM: CHEST  2 VIEW COMPARISON:  Chest radiograph performed 06/13/2015 FINDINGS: The lungs are well-aerated. Mild vascular congestion is noted. Mildly increased interstitial markings raise concern for mild interstitial edema, though minimal pneumonia might have a similar appearance. There is no evidence of pleural effusion or pneumothorax. The heart is borderline normal in size. No acute osseous abnormalities are seen. IMPRESSION: Mild vascular congestion noted. Mildly increased interstitial markings raise concern for mild interstitial edema, though minimal pneumonia might have a similar appearance. Electronically Signed   By: Garald Balding M.D.   On: 09/28/2015 22:05    Review of Systems  Constitutional: Negative for fever, chills and diaphoresis.  HENT: Negative for congestion and sore throat.   Respiratory: Positive for cough and shortness of breath. Negative for sputum production.   Cardiovascular: Positive for orthopnea and PND. Negative for chest pain and leg swelling.  Gastrointestinal: Negative for nausea, vomiting, abdominal pain, blood in stool and melena.  Musculoskeletal: Negative for myalgias.  Neurological: Negative for dizziness.  All other systems reviewed and are negative.  Blood pressure 109/58, pulse 83, temperature 97.5 F (36.4 C), temperature source Oral, resp. rate 24, weight 179 lb (81.194 kg), SpO2 94 %. Physical Exam  Nursing note and vitals  reviewed. Constitutional: He is oriented to person, place, and time. He appears well-developed and well-nourished. No distress.  HENT:   Head: Normocephalic and atraumatic.  Eyes: EOM are normal. No scleral icterus.  Neck: Normal range of motion. Neck supple. JVD present.  Cardiovascular: Normal rate, regular rhythm, S1 normal and S2 normal.   No murmur heard. Pulses:      Radial pulses are 2+ on the right side, and 2+ on the left side.       Dorsalis pedis pulses are 1+ on the right side, and 1+ on the left side.  Respiratory: Effort normal. He has rales.  GI: Soft. Bowel sounds are normal. He exhibits distension. There is no tenderness.  Dull to percussion  Musculoskeletal: He exhibits no edema.  Lymphadenopathy:    He has no cervical adenopathy.  Neurological: He is alert and oriented to person, place, and time. He exhibits normal muscle tone.  Skin: Skin is warm and dry.  Psychiatric: He has a normal mood and affect.    Assessment/Plan: Principal Problem:   Acute respiratory failure (HCC) Active Problems:   CAP (community acquired pneumonia)   DM2 (diabetes mellitus, type 2) ([place])   CKD stage 4 due to type 2 diabetes mellitus (HCC)   Elevated troponin   Shortness of breath   HLD (hyperlipidemia)   BPH (benign prostatic hyperplasia)   Chronic anemia   CHF (congestive heart failure) (HCC)   Acute on chronic systolic congestive heart failure (HCC)   Sepsis (HCC)  Acute on chronic systolic congestive heart failure (HCC) Net fluids: -0.5L.   New echo pending.  He has received two dose of IV lasix 40 and 80.  BP appears stable. Continue with IV diuresis with 80 MG BID.  Dry weight around 171lbs.  Monitor SCr and potassium.  On lopressor 25 BID .  No ACE or ARB with CKD.    Elevated troponin  Chronically elevated.  Recent nuclear stress test with plan for medical Mgt.  Continue this plan.  HLD (hyperlipidemia)   Statin  PNA:  Per IM   HAGER, BRYAN, PAC 09/29/2015, 1:00 PM   As above, patient seen and examined. Briefly he is a 64 year old male with past medical history of diastolic congestive heart  failure, stage IV chronic kidney disease, diabetes mellitus, hypertension, hyperlipidemia and anemia for evaluation of acute on chronic diastolic congestive heart failure. Last echocardiogram in June 2016 showed an ejection fraction of 50-55% with anterior septal and apical wall motion abnormality. Grade 2 diastolic dysfunction with severely elevated pulmonary pressures. Note patient also has a chronically elevated troponin. He had a nuclear study in September that showed partially reversible anterior/apical defect with mild to moderate peri-infarct ischemia and ejection fraction 46%. Dr. Percival Spanish is treating medically as patient not having CP and has significant renal insufficiency. Patient admitted with 3-4 days of worsening orthopnea, dyspnea on exertion, weight gain of 13 pounds and abdominal fullness. No chest pain. Electrocardiogram shows sinus rhythm with lateral T-wave inversion. Troponin 0.79 unchanged from previous levels. Creatinine is 3.22. BNP 1281.  1 acute on chronic diastolic congestive heart failure-The patient presents with volume overload. We'll diurese with Lasix 40 mg IV twice a day. Follow renal function closely. He will likely need higher doses of diuretics at home at discharge. I discussed low-sodium diet, fluid restriction and daily weights. 2 chronic stage IV kidney disease-follow renal function closely with diuresis. He will need close follow-up with nephrology as he is nearing dialysis. 3 elevated troponin-the patient is  not having chest pain. There is no clear trend and his level is similar to those at times of previous admission. This is not consistent with an acute coronary syndrome and is likely elevated from renal insufficiency. Recent nuclear study as outlined above. Given that he is not having chest pain would continue medical therapy including aspirin, beta blocker and statin. He would be extremely high risk for contrast nephropathy. 4 diabetes mellitus-management per primary  care. 5 Hypertension-Blood pressure controlled. Continue present medications. 6 ? Pneumonia-Antibiotics per primary care.

## 2015-09-29 NOTE — ED Notes (Signed)
Attempted report X2 

## 2015-09-29 NOTE — Progress Notes (Addendum)
[facility] Progress Note   Troy Young  [id number]  DOB: 1951/01/20  DOA: 09/28/2015 PCP: [name], MD  Brief narrative: Troy Young is a 64 y.o. male with a past medical history of diastolic heart failure, hypertension, hyperlipidemia, diabetes mellitus, legally blind, colon cancer, chronic kidney disease stage IV, anemia of chronic disease. The patient presents with shortness of breath which is been going on at home for about 5-6 days. He states that his rate has increased by about 9 pounds. In the ER he admitted to having a dry cough. He was noted to have a low-grade fever. Blood work performed in the ER revealed a BNP of 1281, troponin of 1.18 and a WBC count of 11.8.   Subjective: Patient mainly complains of dyspnea on exertion. At this time he has no complaints of cough wheezing or chest pain. He has not noted any fevers or chills at home.  Assessment/Plan: Principal Problem:   Acute respiratory failure  - hypoxic resp failure with dyspnea on exertion which has been progressive this week - CXR reveals diffusely increased infiltrates on CXR- possibly due to CHF exacerbation and respiratory infection- he has low grade fevers but does not have a cough  - cont to diurese and follow for symptoms - influenza negative  Active Problems: CAP (community acquired pneumonia)? - having fevers and dyspnea - no other source for infection - cont Rocephin and Zithromax for now  Acute on chronic diastolic CHF - BNP elevated- diffuse infiltrates on CXR - cont Lasix- Lisinopril on hold due to acute renal failure  AKI on CKD 4 - baseline Cr about 2-3- Cr slightly elevated at 3.22 on admission - may be related to fluid overload? Will diurese and follow Cr   V tach - had a short run of V tach- check Bmet and Mg+ - cont B blocker  Elevated Troponin - no chest pain- may be due to acute resp failure and pulm edema - Troponin improving - last myoview 9/16  revealed a partially reversible defect in mid/distal anterior wall, distal inf wall and apex consistent with prior infarct and mild peri-infarct ischemia - f/u ECHO - have consulted cardiology  DM2 (diabetes mellitus, type 2)  - cont lantus and sliding scale  HLD (hyperlipidemia) - cont statin  BPH (benign prostatic hyperplasia) - cont Flomax   Anemia of chronic disease - stable    Code Status:     Code Status Orders        Start     Ordered   09/29/15 0057  Full code   Continuous     09/29/15 0058     Family Communication:   Disposition Plan: cardiology consult, f/u on resp failure DVT prophylaxis: Heparin Consultants: cardiology Procedures:    Antibiotics: Anti-infectives    Start     Dose/Rate Route Frequency Ordered Stop   09/29/15 0130  cefTRIAXone (ROCEPHIN) 1 g in dextrose 5 % 50 mL IVPB     1 g 100 mL/hr over 30 Minutes Intravenous Daily at bedtime 09/29/15 0103     09/29/15 0100  azithromycin (ZITHROMAX) 500 mg in dextrose 5 % 250 mL IVPB     500 mg 250 mL/hr over 60 Minutes Intravenous Every 24 hours 09/29/15 0053        Objective: Filed Weights   09/28/15 2144 09/29/15 0221  Weight: 83.66 kg (184 lb 7 oz) 81.194 kg (179 lb)    Intake/Output Summary (Last 24 hours) at 09/29/15 1205 Last data filed at 09/29/15  1000  Gross per 24 hour  Intake    760 ml  Output   1050 ml  Net   -290 ml     Vitals Filed Vitals:   09/29/15 0221 09/29/15 0400 09/29/15 0600 09/29/15 1020  BP: 113/64  92/57 109/58  Pulse: 98  81 83  Temp: 100.6 F (38.1 C) 100.7 F (38.2 C) 99.5 F (37.5 C) 97.5 F (36.4 C)  TempSrc: Oral Oral Oral Oral  Resp: 22  24   Weight: 81.194 kg (179 lb)     SpO2: 93%  94%     Exam:  General:  Pt is alert, not in acute distress  HEENT: No icterus, No thrush, oral mucosa moist  Cardiovascular: regular rate and rhythm, S1/S2 No murmur  Respiratory: very poor air entry bilaterally- 94 % on 2 L O2  Abdomen: Soft, +Bowel  sounds, non tender, non distended, no guarding  MSK: No LE edema, cyanosis or clubbing  Data Reviewed: Basic Metabolic Panel:  Recent Labs Lab 09/28/15 2158  NA 138  K 5.0  CL 110  CO2 19*  GLUCOSE 115*  BUN 54*  CREATININE 3.22*  CALCIUM 8.6*   Liver Function Tests: No results for input(s): AST, ALT, ALKPHOS, BILITOT, PROT, ALBUMIN in the last 168 hours. No results for input(s): LIPASE, AMYLASE in the last 168 hours. No results for input(s): AMMONIA in the last 168 hours. CBC:  Recent Labs Lab 09/28/15 2158  WBC 11.8*  HGB 10.5*  HCT 32.1*  MCV 94.7  PLT 231   Cardiac Enzymes:  Recent Labs Lab 09/29/15 0247 09/29/15 0953  TROPONINI 0.74* 0.79*   BNP (last 3 results)  Recent Labs  05/07/15 0212 06/10/15 1711 09/28/15 2251  BNP 303.6* 810.7* 1281.0*    ProBNP (last 3 results) No results for input(s): PROBNP in the last 8760 hours.  CBG:  Recent Labs Lab 09/29/15 0223 09/29/15 0636 09/29/15 1122  GLUCAP 98 143* 121*    Recent Results (from the past 240 hour(s))  Blood culture (routine x 2)     Status: None (Preliminary result)   Collection Time: 09/28/15 10:40 PM  Result Value Ref Range Status   Specimen Description BLOOD RIGHT ANTECUBITAL  Final   Special Requests BOTTLES DRAWN AEROBIC AND ANAEROBIC 5CC   Final   Culture NO GROWTH < 12 HOURS  Final   Report Status PENDING  Incomplete  Blood culture (routine x 2)     Status: None (Preliminary result)   Collection Time: 09/28/15 10:44 PM  Result Value Ref Range Status   Specimen Description BLOOD RIGHT FOREARM  Final   Special Requests BOTTLES DRAWN AEROBIC AND ANAEROBIC 5CC   Final   Culture NO GROWTH < 12 HOURS  Final   Report Status PENDING  Incomplete     Studies: Dg Chest 2 View  09/28/2015  CLINICAL DATA:  Acute onset of worsening shortness of breath on exertion. Dry cough. Abdominal swelling and diaphoresis. Initial encounter. EXAM: CHEST  2 VIEW COMPARISON:  Chest radiograph  performed 06/13/2015 FINDINGS: The lungs are well-aerated. Mild vascular congestion is noted. Mildly increased interstitial markings raise concern for mild interstitial edema, though minimal pneumonia might have a similar appearance. There is no evidence of pleural effusion or pneumothorax. The heart is borderline normal in size. No acute osseous abnormalities are seen. IMPRESSION: Mild vascular congestion noted. Mildly increased interstitial markings raise concern for mild interstitial edema, though minimal pneumonia might have a similar appearance. Electronically Signed   By: Jacqulynn Cadet  Chang M.D.   On: 09/28/2015 22:05    Scheduled Meds:  Scheduled Meds: . aspirin EC  81 mg Oral Daily  . azithromycin  500 mg Intravenous Q24H  . cefTRIAXone (ROCEPHIN)  IV  1 g Intravenous QHS  . dextromethorphan-guaiFENesin  1 tablet Oral BID  . heparin  5,000 Units Subcutaneous 3 times per day  . insulin aspart  0-9 Units Subcutaneous TID WC  . insulin glargine  6 Units Subcutaneous QHS  . iron polysaccharides  150 mg Oral Daily  . metoprolol tartrate  25 mg Oral BID  . pravastatin  40 mg Oral QPM  . sodium chloride  3 mL Intravenous Q12H  . tamsulosin  0.8 mg Oral QPM  . vitamin C  250 mg Oral Daily   Continuous Infusions:   Time spent on care of this patient: 35 min   Blanchard, MD 09/29/2015, 12:05 PM  LOS: 0 days   [facility] Office  [phone] Pager - Text Page per [email or web address] If 7PM-7AM, please contact night-coverage [email or web address]

## 2015-09-29 NOTE — Progress Notes (Signed)
ANTIBIOTIC CONSULT NOTE - INITIAL  Pharmacy Consult for Rocephin Indication: CAP  No Known Allergies  Patient Measurements: Weight: 184 lb 7 oz (83.66 kg)  Vital Signs: Temp: 100.7 F (38.2 C) (11/20 2142) Temp Source: Oral (11/20 2142) BP: 108/62 mmHg (11/21 0045) Pulse Rate: 93 (11/21 0045) Intake/Output from previous day: 11/20 0701 - 11/21 0700 In: -  Out: 275 [Urine:275] Intake/Output from this shift: Total I/O In: -  Out: 275 [Urine:275]  Labs:  Recent Labs  09/28/15 2158  WBC 11.8*  HGB 10.5*  PLT 231  CREATININE 3.22*   Estimated Creatinine Clearance: 23.8 mL/min (by C-G formula based on Cr of 3.22). No results for input(s): VANCOTROUGH, VANCOPEAK, VANCORANDOM, GENTTROUGH, GENTPEAK, GENTRANDOM, TOBRATROUGH, TOBRAPEAK, TOBRARND, AMIKACINPEAK, AMIKACINTROU, AMIKACIN in the last 72 hours.   Microbiology: No results found for this or any previous visit (from the past 720 hour(s)).  Medical History: Past Medical History  Diagnosis Date  . Legally blind   . DM2 (diabetes mellitus, type 2) ([place])   . Hypertension   . Hyperlipidemia   . BPH (benign prostatic hyperplasia)   . Cancer [facility])     colon cancer (malignant polyp) removed  . Spontaneous pneumothorax     2  . Renal insufficiency   . Anemia     Medications:  See electronic med rec  Assessment: 64 y.o. M presents with SOB. To begin Rocephin for CAP. Pt with azithromycin already ordered. WBC elevated to 11.8.  Goal of Therapy:  Resolution of infection  Plan:  Rocephin 1gm IV q24 Pharmacy will sign off - please reconsult if needed  Sherlon Handing, PharmD, BCPS Clinical pharmacist, pager [phone] 09/29/2015,1:00 AM

## 2015-09-30 DIAGNOSIS — R509 Fever, unspecified: Secondary | ICD-10-CM | POA: Insufficient documentation

## 2015-09-30 LAB — BASIC METABOLIC PANEL
Anion gap: 10 (ref 5–15)
BUN: 60 mg/dL — AB (ref 6–20)
CHLORIDE: 107 mmol/L (ref 101–111)
CO2: 21 mmol/L — AB (ref 22–32)
CREATININE: 3.98 mg/dL — AB (ref 0.61–1.24)
Calcium: 8.5 mg/dL — ABNORMAL LOW (ref 8.9–10.3)
GFR calc Af Amer: 17 mL/min — ABNORMAL LOW (ref >60)
GFR calc non Af Amer: 14 mL/min — ABNORMAL LOW (ref >60)
Glucose, Bld: 118 mg/dL — ABNORMAL HIGH (ref 65–99)
Potassium: 4.7 mmol/L (ref 3.5–5.1)
Sodium: 138 mmol/L (ref 135–145)

## 2015-09-30 LAB — HEMOGLOBIN A1C
Hgb A1c MFr Bld: 6.1 % — ABNORMAL HIGH (ref 4.8–5.6)
Mean Plasma Glucose: 128 mg/dL

## 2015-09-30 LAB — URINE CULTURE: Culture: 3000

## 2015-09-30 LAB — GLUCOSE, CAPILLARY
Glucose-Capillary: 98 mg/dL (ref 65–99)
Glucose-Capillary: 97 mg/dL (ref 65–99)
Glucose-Capillary: 128 mg/dL — ABNORMAL HIGH (ref 65–99)
Glucose-Capillary: 168 mg/dL — ABNORMAL HIGH (ref 65–99)

## 2015-09-30 LAB — LEGIONELLA PNEUMOPHILA SEROGP 1 UR AG
L. pneumophila Serogp 1 Ur Ag: NEGATIVE
L. pneumophila Serogp 1 Ur Ag: NEGATIVE

## 2015-09-30 MED ORDER — AZITHROMYCIN 500 MG PO TABS: 500.0000 mg | ORAL_TABLET | Freq: Every day | ORAL | Status: DC

## 2015-09-30 NOTE — Care Management Note (Signed)
Case Management Note  Patient Details  Name: Troy Young MRN: [id number] Date of Birth: 07-Jul-1951  Subjective/Objective:         Admitted with Acute Resp Failure, CHF           Action/Plan: Patient is Blind, lives with his sister and niece. He has Equities trader with prescription drug coverage, no problems getting medication; [facility] choice [facility]. He use a cane for ambulation. Patient could benefit from a Disease Management program for HF; [place] choice offered, patient chose [facility]. Mary with [facility] called for arrangements.  Expected Discharge Date:    possibly 10/02/2015              Expected Discharge Plan:  [place]     Discharge planning Services  CM Consult  Choice offered to:  Patient  HH Arranged:  RN, Disease Management [place] Agency:  [facility]  Status of Service:  In process, will continue to follow  Sherrilyn Rist [phone] 09/30/2015, 11:46 AM

## 2015-09-30 NOTE — Evaluation (Signed)
Occupational Therapy Evaluation Patient Details Name: Troy Young MRN: [id number] DOB: 11/04/1951 Today's Date: 09/30/2015    History of Present Illness 64 yo male with onset of acute respiratory failure and PNA, has sepsis with leukocytosis and EF 50-55%.  His PMHx:  legally blind, CKD 4,    Clinical Impression   Pt admitted for the above diagnosis and has the deficits outlined below. Pt would benefit from one or two more sessions of OT to increase independence with basic adls to S to mod I level so he can d/c back home with his family and return to work.  Pt overall appears deconditioned and once feeling better feel he will resume his daily activities with no further OT services.  Will follow acutely.    Follow Up Recommendations  Supervision - Intermittent;No OT follow up    Equipment Recommendations  None recommended by OT    Recommendations for Other Services       Precautions / Restrictions Precautions Precautions: Fall Restrictions Weight Bearing Restrictions: No      Mobility Bed Mobility Overal bed mobility: Modified Independent             General bed mobility comments: HOB elevated  Transfers Overall transfer level: Modified independent Equipment used: 1 person hand held assist (trailing cane at home.)             General transfer comment: uses legs to place himself    Balance Overall balance assessment: Needs assistance Sitting-balance support: Feet supported Sitting balance-Leahy Scale: Good     Standing balance support: During functional activity Standing balance-Leahy Scale: Fair Standing balance comment: Pt was mildly insteady compared to normal.                            ADL Overall ADL's : Needs assistance/impaired Eating/Feeding: Independent;Sitting Eating/Feeding Details (indicate cue type and reason): VCs at beginning of meal to orient to where food is and then I. Grooming: Wash/dry hands;Wash/dry face;Oral  care;Supervision/safety;Cueing for compensatory techniques;Standing Grooming Details (indicate cue type and reason): cuing for where things are due to low vision. Pt leaves all items in same place at home.  Min guard for balance while standing at the sink. Upper Body Bathing: Set up;Sitting   Lower Body Bathing: Supervison/ safety;Sit to/from stand;Cueing for compensatory techniques Lower Body Bathing Details (indicate cue type and reason): cueing for location of items. Upper Body Dressing : Set up;Sitting   Lower Body Dressing: Supervision/safety;Sit to/from stand Lower Body Dressing Details (indicate cue type and reason): cueing for location of items. Toilet Transfer: Min guard;Ambulation (hand held assist. Trailing cane at home.)   Toileting- Clothing Manipulation and Hygiene: Min guard;Sit to/from stand;Cueing for compensatory techniques Toileting - Clothing Manipulation Details (indicate cue type and reason): cueing for location of items.     Functional mobility during ADLs: Min guard;Cueing for safety (hand held assist as trailing cane at home.) General ADL Comments: Pt does very well with adls. Most limited by vision bc he is out of his environment.     Vision Vision Assessment?:  (PT legally blind) Additional Comments: uses trailing cane for mobility.  Works at [employer] center.  Takes public transporation to get there.  [employer]AT gets him from house and takes him to bus depot and then he goes to work from there.  Takes bus to depot home and [employer] gets him from depot to his house.  Pt gets up at 4 am each day and  returns home from work at [employer]      Pertinent Vitals/Pain Pain Assessment: No/denies pain     Hand Dominance Right   Extremity/Trunk Assessment Upper Extremity Assessment Upper Extremity Assessment: Overall WFL for tasks assessed   Lower Extremity Assessment Lower Extremity Assessment: Defer to PT evaluation   Cervical / Trunk  Assessment Cervical / Trunk Assessment: Normal   Communication Communication Communication: No difficulties   Cognition Arousal/Alertness: Awake/alert Behavior During Therapy: WFL for tasks assessed/performed Overall Cognitive Status: Within Functional Limits for tasks assessed                     General Comments       Exercises       Shoulder Instructions      Home Living Family/patient expects to be discharged to:: Private residence Living Arrangements: Other relatives Available Help at Discharge: Family;Available 24 hours/day Type of Home: Apartment Home Access: Level entry   Entrance Stairs-Rails: None Home Layout: One level     Bathroom Shower/Tub: Tub/shower unit Shower/tub characteristics: Architectural technologist: Standard Bathroom Accessibility: Yes   Home Equipment: Other (comment)   Additional Comments: trailing cane due to low vision      Prior Functioning/Environment Level of Independence: Independent with assistive device(s)        Comments: amb with blind cane I'ly    OT Diagnosis: Generalized weakness;Disturbance of vision   OT Problem List: Decreased activity tolerance;Impaired balance (sitting and/or standing)   OT Treatment/Interventions: Self-care/ADL training;Therapeutic activities    OT Goals(Current goals can be found in the care plan section) Acute Rehab OT Goals Patient Stated Goal: to go home tomorrow OT Goal Formulation: With patient Time For Goal Achievement: 10/07/15 Potential to Achieve Goals: Good ADL Goals Additional ADL Goal #1: Pt will walk with trailiing cane to bathroom and toilet with cues for vision only. Additional ADL Goal #2: Pt will walk to sink with trailing cane and groom with cues for vision only.  OT Frequency: Min 2X/week   Barriers to D/C:            Co-evaluation              End of Session Nurse Communication: Mobility status  Activity Tolerance: Patient tolerated treatment  well Patient left: in bed;with call bell/phone within reach;with chair alarm set   Time: [id number] OT Time Calculation (min): 21 min Charges:  OT General Charges $OT Visit: 1 Procedure OT Evaluation $Initial OT Evaluation Tier I: 1 Procedure G-Codes:    Glenford Peers Oct 27, 2015, 10:16 AM  [phone]

## 2015-09-30 NOTE — Progress Notes (Signed)
Physical Therapy Treatment Patient Details Name: Troy Young MRN: [id number] DOB: May 10, 1951 Today's Date: 10/03/15    History of Present Illness 64 yo male with onset of acute respiratory failure and PNA, has sepsis with leukocytosis and EF 50-55%.  His PMHx:  legally blind, CKD 4,     PT Comments    Pt is tired following a walk with nursing earlier and agreed to bed exercises.  Have completed them with good effort, cued better use of hips versus knees to abd/add hips on BLE's but more so issue with RLE.    Follow Up Recommendations  No PT follow up     Equipment Recommendations  None recommended by PT    Recommendations for Other Services       Precautions / Restrictions Precautions Precautions: Fall Restrictions Weight Bearing Restrictions: No    Mobility  Bed Mobility Overal bed mobility: Modified Independent             General bed mobility comments: HOB elevated  Transfers                 General transfer comment: declined  Ambulation/Gait                 Stairs            Wheelchair Mobility    Modified Rankin (Stroke Patients Only)       Balance                                    Cognition Arousal/Alertness: Awake/alert Behavior During Therapy: WFL for tasks assessed/performed Overall Cognitive Status: Within Functional Limits for tasks assessed                      Exercises General Exercises - Lower Extremity Ankle Circles/Pumps: AROM;AAROM;Both;15 reps Quad Sets: AROM;Both;15 reps Gluteal Sets: AROM;Both;10 reps Heel Slides: AROM;Both;15 reps Hip ABduction/ADduction: AROM;Both;15 reps Straight Leg Raises: AAROM;Both;10 reps    General Comments        Pertinent Vitals/Pain Pain Assessment: No/denies pain    Home Living                      Prior Function            PT Goals (current goals can now be found in the care plan section) Acute Rehab PT Goals Patient  Stated Goal: get home Progress towards PT goals: Progressing toward goals    Frequency  Min 2X/week    PT Plan Current plan remains appropriate    Co-evaluation             End of Session Equipment Utilized During Treatment: Oxygen Activity Tolerance: Patient tolerated treatment well;No increased pain Patient left: in bed;with bed alarm set;with family/visitor present     Time: [id number] PT Time Calculation (min) (ACUTE ONLY): 27 min  Charges:  $Therapeutic Exercise: 8-22 mins $Therapeutic Activity: 8-22 mins                    G Codes:      Ramond Dial 10/03/15, 4:54 PM   Mee Hives, PT MS Acute Rehab Dept. Number: [phone] [id number] and [place] [phone]

## 2015-09-30 NOTE — Progress Notes (Signed)
Subjective:  Denies CP or dyspnea   Objective:  Filed Vitals:   09/29/15 1800 09/29/15 2010 09/30/15 0430 09/30/15 1034  BP: 101/57 103/52 98/54 106/58  Pulse: 92 97 90 82  Temp: 98.8 F (37.1 C) 99.8 F (37.7 C) 97.4 F (36.3 C)   TempSrc: Oral Oral Axillary   Resp: 20 18 18    Weight:   79.47 kg (175 lb 3.2 oz)   SpO2: 96% 98% 98%     Intake/Output from previous day:  Intake/Output Summary (Last 24 hours) at 09/30/15 1103 Last data filed at 09/30/15 0906  Gross per 24 hour  Intake   1448 ml  Output   1626 ml  Net   -178 ml    Physical Exam: Physical exam: Well-developed well-nourished in no acute distress.  Skin is warm and dry.  HEENT is normal.  Neck is supple.  Chest is clear to auscultation with normal expansion.  Cardiovascular exam is regular rate and rhythm.  Abdominal exam nontender or distended. No masses palpated. Extremities show no edema. neuro grossly intact    Lab Results: Basic Metabolic Panel:  Recent Labs  09/29/15 1305 09/30/15 0315  NA 139 138  K 4.7 4.7  CL 107 107  CO2 23 21*  GLUCOSE 103* 118*  BUN 58* 60*  CREATININE 3.67* 3.98*  CALCIUM 9.0 8.5*  MG 2.1  --    CBC:  Recent Labs  09/28/15 2158  WBC 11.8*  HGB 10.5*  HCT 32.1*  MCV 94.7  PLT 231   Cardiac Enzymes:  Recent Labs  09/29/15 0247 09/29/15 0953 09/29/15 1540  TROPONINI 0.74* 0.79* 0.69*     Assessment/Plan:  64 year old male with past medical history of diastolic congestive heart failure, stage IV chronic kidney disease, diabetes mellitus, hypertension, hyperlipidemia and anemia for evaluation of acute on chronic diastolic congestive heart failure. Last echocardiogram in June 2016 showed an ejection fraction of 50-55% with anterior septal and apical wall motion abnormality. Grade 2 diastolic dysfunction with severely elevated pulmonary pressures. Note patient also has a chronically elevated troponin. He had a nuclear study in September that  showed partially reversible anterior/apical defect with mild to moderate peri-infarct ischemia and ejection fraction 46%. Dr. Percival Spanish is treating medically as patient not having CP and has significant renal insufficiency. Patient admitted with 3-4 days of worsening orthopnea, dyspnea on exertion, weight gain of 13 pounds and abdominal fullness. No chest pain. Electrocardiogram shows sinus rhythm with lateral T-wave inversion. Troponin 0.79 unchanged from previous levels. Creatinine is 3.22. BNP 1281. 1 acute on chronic diastolic congestive heart failure-The patient presents with volume overload but is improving. Continue Lasix 40 mg IV twice a day. Follow renal function closely. He will likely need higher doses of diuretics at home at discharge. I discussed low-sodium diet, fluid restriction and daily weights. 2 chronic stage IV kidney disease-follow renal function closely with diuresis. He will need close follow-up with nephrology as he is nearing dialysis. 3 elevated troponin-the patient is not having chest pain. There is no clear trend and his level is similar to those at times of previous admission. This is not consistent with an acute coronary syndrome and is likely elevated from renal insufficiency. Recent nuclear study as outlined above. Given that he is not having chest pain would continue medical therapy including aspirin, beta blocker and statin. He would be extremely high risk for contrast nephropathy. 4 diabetes mellitus-management per primary care. 5 Hypertension-Blood pressure controlled. Continue present medications. 6 ? Pneumonia-Antibiotics per  primary care.  Troy Young 09/30/2015, 11:03 AM

## 2015-09-30 NOTE — Consult Note (Signed)
   [facility] Inpatient Consult   09/30/2015  Troy Young 19-Dec-1950 [id number]  Thank you for this consult. Patient was evaluated for [place] Management services  This patient is Not eligible for [facility] Care Management Services. 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  Reason:  Not a beneficiary currently attributed to one of the [place].  Membership roster was used to verify non- eligible status. For questions, please contact: Natividad Brood, RN BSN [facility] Liaison  [phone] business mobile phone

## 2015-09-30 NOTE — Progress Notes (Addendum)
[facility] Progress Note   Troy Young  [id number]  DOB: 1951/06/01  DOA: 09/28/2015 PCP: [name], MD  Brief narrative: Troy Young is a 64 y.o. male with a past medical history of diastolic heart failure, hypertension, hyperlipidemia, diabetes mellitus, legally blind, colon cancer, chronic kidney disease stage IV, anemia of chronic disease. The patient presents with shortness of breath which is been going on at home for about 5-6 days. He states that his rate has increased by about 9 pounds. In the ER he admitted to having a dry cough. He was noted to have a low-grade fever. Blood work performed in the ER revealed a BNP of 1281, troponin of 1.18 and a WBC count of 11.8.   Subjective: Has not ambulated and cannot tell if she remains dyspneic on exertion.   Assessment/Plan: Principal Problem:   Acute respiratory failure  - hypoxic resp failure with dyspnea on exertion which has been progressive this week - CXR reveals diffusely increased infiltrates on CXR- possibly due to CHF exacerbation and respiratory infection- he has low grade fevers but does not have a cough  - cont to diurese and follow for symptoms - influenza negative  Active Problems: Fevers- suspected initially to be CAP (community acquired pneumonia)? - having fevers and dyspnea - no other source for infection- still no cough -will d/c Rocephin and Zithromax as no symptoms of pneumonia- possibly viral infection?   Acute on chronic diastolic CHF - BNP elevated- diffuse infiltrates on CXR - cont Lasix- Lisinopril on hold due to acute renal failure - ECHO reveals EF of [id number], Grade 2 diastolic dysfunction, mod MR, mod TR- Mid anteroseptal, apical septal, apical inferior, apical anterior, and true apex severe hypokinesis. AKI on CKD 4 - baseline Cr about 2-3- Cr slightly elevated at 3.22 on admission - may be related to fluid overload? Will diurese and follow Cr   V tach - had a short run of  V tach yesterday - checked Bmet and Mg+ which were normal - cont B blocker  Elevated Troponin - no chest pain- may be due to acute resp failure and pulm edema - Troponin improving - last myoview 9/16 revealed a partially reversible defect in mid/distal anterior wall, distal inf wall and apex consistent with prior infarct and mild peri-infarct ischemia - have consulted cardiology- medical management only for now  DM2 (diabetes mellitus, type 2)  - cont lantus and sliding scale  HLD (hyperlipidemia) - cont statin  BPH (benign prostatic hyperplasia) - cont Flomax   Anemia of chronic disease - stable    Code Status:     Code Status Orders        Start     Ordered   09/29/15 0057  Full code   Continuous     09/29/15 0058     Family Communication:   Disposition Plan: cardiology consult, f/u on resp failure DVT prophylaxis: Heparin Consultants: cardiology Procedures:    Antibiotics: Anti-infectives    Start     Dose/Rate Route Frequency Ordered Stop   09/30/15 2200  azithromycin (ZITHROMAX) tablet 500 mg     500 mg Oral Daily at bedtime 09/30/15 1025     09/29/15 0130  cefTRIAXone (ROCEPHIN) 1 g in dextrose 5 % 50 mL IVPB     1 g 100 mL/hr over 30 Minutes Intravenous Daily at bedtime 09/29/15 0103     09/29/15 0100  azithromycin (ZITHROMAX) 500 mg in dextrose 5 % 250 mL IVPB  Status:  Discontinued  500 mg 250 mL/hr over 60 Minutes Intravenous Every 24 hours 09/29/15 0053 09/30/15 1025      Objective: Filed Weights   09/28/15 2144 09/29/15 0221 09/30/15 0430  Weight: 83.66 kg (184 lb 7 oz) 81.194 kg (179 lb) 79.47 kg (175 lb 3.2 oz)    Intake/Output Summary (Last 24 hours) at 09/30/15 1135 Last data filed at 09/30/15 0906  Gross per 24 hour  Intake   1448 ml  Output   1626 ml  Net   -178 ml     Vitals Filed Vitals:   09/29/15 1800 09/29/15 2010 09/30/15 0430 09/30/15 1034  BP: 101/57 103/52 98/54 106/58  Pulse: 92 97 90 82  Temp: 98.8 F (37.1 C)  99.8 F (37.7 C) 97.4 F (36.3 C)   TempSrc: Oral Oral Axillary   Resp: 20 18 18    Weight:   79.47 kg (175 lb 3.2 oz)   SpO2: 96% 98% 98%     Exam:  General:  Pt is alert, not in acute distress  HEENT: No icterus, No thrush, oral mucosa moist  Cardiovascular: regular rate and rhythm, S1/S2 No murmur  Respiratory: very poor air entry bilaterally- 94 % on room air today  Abdomen: Soft, +Bowel sounds, non tender, non distended, no guarding  MSK: No LE edema, cyanosis or clubbing  Data Reviewed: Basic Metabolic Panel:  Recent Labs Lab 09/28/15 2158 09/29/15 1305 09/30/15 0315  NA 138 139 138  K 5.0 4.7 4.7  CL 110 107 107  CO2 19* 23 21*  GLUCOSE 115* 103* 118*  BUN 54* 58* 60*  CREATININE 3.22* 3.67* 3.98*  CALCIUM 8.6* 9.0 8.5*  MG  --  2.1  --    Liver Function Tests: No results for input(s): AST, ALT, ALKPHOS, BILITOT, PROT, ALBUMIN in the last 168 hours. No results for input(s): LIPASE, AMYLASE in the last 168 hours. No results for input(s): AMMONIA in the last 168 hours. CBC:  Recent Labs Lab 09/28/15 2158  WBC 11.8*  HGB 10.5*  HCT 32.1*  MCV 94.7  PLT 231   Cardiac Enzymes:  Recent Labs Lab 09/29/15 0247 09/29/15 0953 09/29/15 1540  TROPONINI 0.74* 0.79* 0.69*   BNP (last 3 results)  Recent Labs  05/07/15 0212 06/10/15 1711 09/28/15 2251  BNP 303.6* 810.7* 1281.0*    ProBNP (last 3 results) No results for input(s): PROBNP in the last 8760 hours.  CBG:  Recent Labs Lab 09/29/15 1122 09/29/15 1619 09/29/15 1707 09/29/15 2127 09/30/15 0615  GLUCAP 121* 75 93 107* 98    Recent Results (from the past 240 hour(s))  Blood culture (routine x 2)     Status: None (Preliminary result)   Collection Time: 09/28/15 10:40 PM  Result Value Ref Range Status   Specimen Description BLOOD RIGHT ANTECUBITAL  Final   Special Requests BOTTLES DRAWN AEROBIC AND ANAEROBIC 5CC   Final   Culture NO GROWTH 2 DAYS  Final   Report Status PENDING   Incomplete  Blood culture (routine x 2)     Status: None (Preliminary result)   Collection Time: 09/28/15 10:44 PM  Result Value Ref Range Status   Specimen Description BLOOD RIGHT FOREARM  Final   Special Requests BOTTLES DRAWN AEROBIC AND ANAEROBIC 5CC   Final   Culture NO GROWTH 2 DAYS  Final   Report Status PENDING  Incomplete  Urine culture     Status: None   Collection Time: 09/29/15 12:15 AM  Result Value Ref Range Status  Specimen Description URINE, CLEAN CATCH  Final   Special Requests Immunocompromised  Final   Culture 3,000 COLONIES/mL INSIGNIFICANT GROWTH  Final   Report Status 09/30/2015 FINAL  Final     Studies: Dg Chest 2 View  09/28/2015  CLINICAL DATA:  Acute onset of worsening shortness of breath on exertion. Dry cough. Abdominal swelling and diaphoresis. Initial encounter. EXAM: CHEST  2 VIEW COMPARISON:  Chest radiograph performed 06/13/2015 FINDINGS: The lungs are well-aerated. Mild vascular congestion is noted. Mildly increased interstitial markings raise concern for mild interstitial edema, though minimal pneumonia might have a similar appearance. There is no evidence of pleural effusion or pneumothorax. The heart is borderline normal in size. No acute osseous abnormalities are seen. IMPRESSION: Mild vascular congestion noted. Mildly increased interstitial markings raise concern for mild interstitial edema, though minimal pneumonia might have a similar appearance. Electronically Signed   By: Garald Balding M.D.   On: 09/28/2015 22:05    Scheduled Meds:  Scheduled Meds: . aspirin EC  81 mg Oral Daily  . azithromycin  500 mg Oral QHS  . cefTRIAXone (ROCEPHIN)  IV  1 g Intravenous QHS  . dextromethorphan-guaiFENesin  1 tablet Oral BID  . furosemide  40 mg Intravenous BID  . heparin  5,000 Units Subcutaneous 3 times per day  . insulin aspart  0-9 Units Subcutaneous TID WC  . insulin glargine  6 Units Subcutaneous QHS  . iron polysaccharides  150 mg Oral Daily  .  metoprolol tartrate  25 mg Oral BID  . pravastatin  40 mg Oral QPM  . sodium chloride  3 mL Intravenous Q12H  . tamsulosin  0.8 mg Oral QPM  . vitamin C  250 mg Oral Daily   Continuous Infusions:   Time spent on care of this patient: 35 min   [place], MD 09/30/2015, 11:35 AM  LOS: 1 day   [facility] Office  [phone] Pager - Text Page per [email or web address] If 7PM-7AM, please contact night-coverage [email or web address]

## 2015-09-30 NOTE — Progress Notes (Signed)
Heart Failure Navigator Consult Note  Presentation: Troy Young is a 64 year old male with history of chronic diastolic heart failure, diabetes mellitus, hypertension, hyperlipidemia, colon cancer, CKD IV and anemia. He is legally blind. 2-D echocardiogram June 2016 revealed an ejection fraction of 50-55% with akinesis of the mid anterior and anteroseptal and apical anterior and septal walls, with grade 2 diastolic dysfunction mild MR and moderate tricuspid regurgitation peak PA pressure of 61 mmHg. This echo was completed and the setting of a mildly elevated troponin. He then had a nuclear stress test September 2016 which was considered intermediate risk, with a large, severe, partially reversible defect in the mid distal anterior wall, distal inferior wall and apex. Mild to moderate peri-infarct ischemia with an EF calculated at 46%. In the absence of chest pain was decided to manage him medically.  The patient presents with dyspnea that has been getting progressively worse over the last several days. His weight is also been going up. He was not aware that he should call the office if his weight continued to increase. His weight 07/01/15 was 171 and yesterday it was 184. Troponin is 0.79. + orthopnea, PND. The patient currently denies nausea, vomiting, fever, chest pain, dizziness, cough, congestion, abdominal pain, hematochezia, melena, lower extremity edema, claudication.   Past Medical History  Diagnosis Date  . Legally blind   . DM2 (diabetes mellitus, type 2) ([place])   . Hypertension   . Hyperlipidemia   . BPH (benign prostatic hyperplasia)   . Cancer [facility])     colon cancer (malignant polyp) removed  . Spontaneous pneumothorax     2  . Renal insufficiency   . Anemia     Social History   Social History  . Marital Status: Divorced    Spouse Name: N/A  . Number of Children: 2  . Years of Education: N/A   Social History Main Topics  . Smoking status: Former  Smoker -- 0.00 packs/day for 0 years    Types: Cigarettes  . Smokeless tobacco: None     Comment: Quit early 90s  . Alcohol Use: No  . Drug Use: None  . Sexual Activity: Not Asked   Other Topics Concern  . None   Social History Narrative   Lives with sister and niece.  Works at [employer].      ECHO:Study Conclusions--09/29/15  - Left ventricle: The cavity size was normal. Wall thickness was normal. The estimated ejection fraction was 45%. Mid anteroseptal, apical septal, apical inferior, apical anterior, and true apex severe hypokinesis. Features are consistent with a pseudonormal left ventricular filling pattern, with concomitant abnormal relaxation and increased filling pressure (grade 2 diastolic dysfunction). E/medial e&' > 15 suggests LV end diastolic pressure at least 20 mmHg. - Aortic valve: There was no stenosis. There was trivial regurgitation. - Mitral valve: There was moderate regurgitation. Effective regurgitant orifice (PISA): 0.28 cm^2. - Left atrium: The atrium was mildly dilated. - Right ventricle: The cavity size was normal. Systolic function was normal. - Tricuspid valve: There was moderate regurgitation. Peak RV-RA gradient (S): 48 mm Hg. - Pulmonary arteries: PA peak pressure: 51 mm Hg (S). - Inferior vena cava: The vessel was normal in size. The respirophasic diameter changes were in the normal range (>= 50%), consistent with normal central venous pressure.  Impressions:  - Normal LV size with EF 45%. Wall motion abnormalities as noted above. Moderate diastolic dysfunction with evidence for elevated LV filling pressure. Normal RV size and systolic function. Moderate  mitral regurgitation, mechanism is not immediately evident. Moderate TR with moderate pulmonary hypertension. Surprisingly, the IVC is not dilated.  Transthoracic echocardiography. M-mode, complete 2D, spectral Doppler, and color  Doppler. Birthdate: Patient birthdate: Mar 14, 1951. Age: Patient is 64 yr old. Sex: Gender: male. BMI: 26 kg/m^2. Blood pressure:   124/70 Patient status: Inpatient. Study date: Study date: 09/29/2015. Study time: 08:42 AM. Location: Bedside. BNP    Component Value Date/Time   BNP 1281.0* 09/28/2015 2251    ProBNP No results found for: PROBNP   Education Assessment and Provision:  Detailed education and instructions provided on heart failure disease management including the following:  Signs and symptoms of Heart Failure When to call the physician Importance of daily weights Low sodium diet Fluid restriction Medication management Anticipated future follow-up appointments  Patient education given on each of the above topics.  Patient acknowledges understanding and acceptance of all instructions.  I spoke with Mr. Eary regarding his HF.  I have emphasized the importance of daily weights and how they relate to the signs and symptoms of HF.  He admits that he does not have a scale at home and has not been weighing daily.  He is legally blind and therefore I recommended that he get a "talking scale" and weigh each morning.  He tells me that he works each day and rides the [employer] bus to get to [place] to his workplace.  He lives with his sister and she helps him some with cooking and medications.  He says that they avoid salt and sodium "as best they can".  I have reviewed a low sodium diet and high sodium foods to avoid.  He denies any issues getting or taking medications.  He has been seen at [facility] as an outpatient previously--likely will follow-up there.  Education Materials:  "Living Better With Heart Failure" Booklet, Daily Weight Tracker Tool    High Risk Criteria for Readmission and/or Poor Patient Outcomes:   EF <30%- no 45%  2 or more admissions in 6 months- 3/6  Difficult social situation- no  Demonstrates medication noncompliance- no   Barriers of  Care:  Knowledge and compliance  Discharge Planning:    He plans to return to home with sister.  He would benefit from [facility] for ongoing education, compliance reinforcement and symptom recognition.  He may also be eligible for [facility] case management.  I will collaborate with Case Manager to assist with [facility] and refer to [facility] for eligibility.

## 2015-10-01 LAB — GLUCOSE, CAPILLARY
GLUCOSE-CAPILLARY: 162 mg/dL — AB (ref 65–99)
GLUCOSE-CAPILLARY: 149 mg/dL — AB (ref 65–99)
Glucose-Capillary: 177 mg/dL — ABNORMAL HIGH (ref 65–99)
Glucose-Capillary: 129 mg/dL — ABNORMAL HIGH (ref 65–99)

## 2015-10-01 LAB — BASIC METABOLIC PANEL
ANION GAP: 9 (ref 5–15)
BUN: 56 mg/dL — ABNORMAL HIGH (ref 6–20)
CO2: 24 mmol/L (ref 22–32)
Calcium: 8.7 mg/dL — ABNORMAL LOW (ref 8.9–10.3)
Chloride: 105 mmol/L (ref 101–111)
Creatinine, Ser: 3.55 mg/dL — ABNORMAL HIGH (ref 0.61–1.24)
GFR calc non Af Amer: 17 mL/min — ABNORMAL LOW (ref >60)
GFR, EST AFRICAN AMERICAN: 19 mL/min — AB (ref >60)
GLUCOSE: 123 mg/dL — AB (ref 65–99)
POTASSIUM: 4.4 mmol/L (ref 3.5–5.1)
Sodium: 138 mmol/L (ref 135–145)

## 2015-10-01 MED ORDER — FUROSEMIDE 40 MG PO TABS
40.0000 mg | ORAL_TABLET | Freq: Two times a day (BID) | ORAL | Status: DC
  Administered 2015-10-01 – 2015-10-02 (×3): 40 mg via ORAL
  Filled 2015-10-01 (×3): qty 1

## 2015-10-01 NOTE — Progress Notes (Signed)
SATURATION QUALIFICATIONS: (This note is used to comply with regulatory documentation for home oxygen)  Patient Saturations on Room Air at Rest = 100%  Patient Saturations on Room Air while Ambulating = 78%   Patient rebounded to 98% on Room Air after sitting down.  Please briefly explain why patient needs home oxygen: Patient O2 sat dropped below 80% while ambulating.  Will continue to monitor.

## 2015-10-01 NOTE — Progress Notes (Signed)
Subjective:  Denies CP or dyspnea   Objective:  Filed Vitals:   09/30/15 1034 09/30/15 1137 09/30/15 2038 10/01/15 0436  BP: 106/58 101/60 105/63 104/66  Pulse: 82 81 87 81  Temp:  97.6 F (36.4 C) 98.6 F (37 C) 99.5 F (37.5 C)  TempSrc:  Oral Oral Oral  Resp:  18 18 18   Weight:    78.744 kg (173 lb 9.6 oz)  SpO2:  94% 100% 99%    Intake/Output from previous day:  Intake/Output Summary (Last 24 hours) at 10/01/15 [id number] Last data filed at 10/01/15 0200  Gross per 24 hour  Intake    480 ml  Output   1500 ml  Net  -1020 ml    Physical Exam: Physical exam: Well-developed well-nourished in no acute distress.  Skin is warm and dry.  HEENT is normal.  Neck is supple.  Chest is clear to auscultation with normal expansion.  Cardiovascular exam is regular rate and rhythm.  Abdominal exam nontender or distended. No masses palpated. Extremities show no edema. neuro grossly intact    Lab Results: Basic Metabolic Panel:  Recent Labs  09/29/15 1305 09/30/15 0315 10/01/15 0402  NA 139 138 138  K 4.7 4.7 4.4  CL 107 107 105  CO2 23 21* 24  GLUCOSE 103* 118* 123*  BUN 58* 60* 56*  CREATININE 3.67* 3.98* 3.55*  CALCIUM 9.0 8.5* 8.7*  MG 2.1  --   --    CBC:  Recent Labs  09/28/15 2158  WBC 11.8*  HGB 10.5*  HCT 32.1*  MCV 94.7  PLT 231   Cardiac Enzymes:  Recent Labs  09/29/15 0247 09/29/15 0953 09/29/15 1540  TROPONINI 0.74* 0.79* 0.69*     Assessment/Plan:  64 year old male with past medical history of diastolic congestive heart failure, stage IV chronic kidney disease, diabetes mellitus, hypertension, hyperlipidemia and anemia for evaluation of acute on chronic diastolic congestive heart failure. Last echocardiogram in June 2016 showed an ejection fraction of 50-55% with anterior septal and apical wall motion abnormality. Grade 2 diastolic dysfunction with severely elevated pulmonary pressures. Note patient also has a chronically elevated  troponin. He had a nuclear study in September that showed partially reversible anterior/apical defect with mild to moderate peri-infarct ischemia and ejection fraction 46%. Dr. Percival Spanish is treating medically as patient not having CP and has significant renal insufficiency. Patient admitted with 3-4 days of worsening orthopnea, dyspnea on exertion, weight gain of 13 pounds and abdominal fullness. No chest pain. Electrocardiogram shows sinus rhythm with lateral T-wave inversion. Troponin 0.79 unchanged from previous levels. Creatinine is 3.22. BNP 1281. 1 acute on chronic diastolic congestive heart failure-The patient presentsed with volume overload but improved. Change lasix to 40 mg BID at DC. Check BMET next week with results to Dr Percival Spanish. FU Dr Percival Spanish or PA 1-2 weeks following DC. I discussed low-sodium diet, fluid restriction and daily weights. 2 chronic stage IV kidney disease-as outlined above, bmet next week with results to Dr Percival Spanish. He will need close follow-up with nephrology as he is nearing dialysis. 3 elevated troponin-the patient is not having chest pain. There is no clear trend and his level is similar to those at times of previous admission. This is not consistent with an acute coronary syndrome and is likely elevated from renal insufficiency. Recent nuclear study as outlined above. Given that he is not having chest pain would continue medical therapy including aspirin, beta blocker and statin. He would be extremely high risk for  contrast nephropathy. 4 diabetes mellitus-management per primary care. 5 Hypertension-Blood pressure controlled. Continue present medications. 6 ? Pneumonia-Antibiotics per primary care. We will sign off; please call with questions. Kirk Ruths 10/01/2015, 9:22 AM

## 2015-10-01 NOTE — Progress Notes (Signed)
[facility] Progress Note   Troy Young  [id number]  DOB: 1950-12-19  DOA: 09/28/2015 PCP: [name], MD  Brief narrative: Troy Young is a 64 y.o. male with a past medical history of diastolic heart failure, hypertension, hyperlipidemia, diabetes mellitus, legally blind, colon cancer, chronic kidney disease stage IV, anemia of chronic disease. The patient presents with shortness of breath which is been going on at home for about 5-6 days. He states that his rate has increased by about 9 pounds. In the ER he admitted to having a dry cough. He was noted to have a low-grade fever. Blood work performed in the ER revealed a BNP of 1281, troponin of 1.18 and a WBC count of 11.8.   Subjective: Is fair Some mild cough here and there but feels improved  Assessment/Plan: Principal Problem:   Acute respiratory failure  - hypoxic resp failure with dyspnea on exertion is resolving - CXR reveals diffusely increased infiltrates on CXR- possibly due to CHF exacerbation and respiratory infection - mild cough  - cont to diurese and follow for symptoms - influenza negative  Active Problems: Fevers- suspected initially to be CAP (community acquired pneumonia)? - having fevers and dyspnea - no other source for infection- still no cough -will d/c Rocephin and Zithromax as no symptoms of pneumonia- possibly viral infection?   Acute on chronic diastolic CHF - BNP elevated- diffuse infiltrates on CXR - cont Lasix- Lisinopril on hold due to acute renal failure-would resume after sees PCP as OP - ECHO reveals EF of [id number], Grade 2 diastolic dysfunction, mod MR, mod TR- Mid anteroseptal, apical septal, apical inferior, apical anterior, and true apex severe hypokinesis.  AKI on CKD 4 - baseline Cr about 2-3- Cr slightly elevated at 3.22 on admission - may be related to fluid overload? Will diurese and follow Cr  -creatinine day before d/c was 3.55 -rpt in am  V tach - had a  short run of V tach yesterday - checked Bmet and Mg+ which were normal - cont metoprolol 25 bid  Elevated Troponin - no chest pain- may be due to acute resp failure and pulm edema - Troponin improving - last myoview 9/16 revealed a partially reversible defect in mid/distal anterior wall, distal inf wall and apex consistent with prior infarct and mild peri-infarct ischemia - consulted cardiology- medical management only for now--they have also signed off as of 10/01/15  DM2 (diabetes mellitus, type 2)  - cont lantus 6 U and sesnsitive sliding scale -CBG's 123-177  HLD (hyperlipidemia) - cont statin  BPH (benign prostatic hyperplasia) - cont Flomax 0.4 mg   Anemia of chronic disease - stable    Code Status:     Code Status Orders        Start     Ordered   09/29/15 0057  Full code   Continuous     09/29/15 0058     Family Communication:   Disposition Plan: probable d/c home in am 11/24 if all stable DVT prophylaxis: Heparin Consultants: cardiology Procedures:    Antibiotics: Anti-infectives    Start     Dose/Rate Route Frequency Ordered Stop   09/30/15 2200  azithromycin (ZITHROMAX) tablet 500 mg  Status:  Discontinued     500 mg Oral Daily at bedtime 09/30/15 1025 09/30/15 1138   09/29/15 0130  cefTRIAXone (ROCEPHIN) 1 g in dextrose 5 % 50 mL IVPB  Status:  Discontinued     1 g 100 mL/hr over 30 Minutes Intravenous Daily at  bedtime 09/29/15 0103 09/30/15 1138   09/29/15 0100  azithromycin (ZITHROMAX) 500 mg in dextrose 5 % 250 mL IVPB  Status:  Discontinued     500 mg 250 mL/hr over 60 Minutes Intravenous Every 24 hours 09/29/15 0053 09/30/15 1025      Objective: Filed Weights   09/29/15 0221 09/30/15 0430 10/01/15 0436  Weight: 81.194 kg (179 lb) 79.47 kg (175 lb 3.2 oz) 78.744 kg (173 lb 9.6 oz)    Intake/Output Summary (Last 24 hours) at 10/01/15 1518 Last data filed at 10/01/15 1410  Gross per 24 hour  Intake   1020 ml  Output   1850 ml  Net   -830  ml     Vitals Filed Vitals:   09/30/15 2038 10/01/15 0436 10/01/15 0950 10/01/15 1120  BP: 105/63 104/66 105/55 103/58  Pulse: 87 81 87 73  Temp: 98.6 F (37 C) 99.5 F (37.5 C)  98.2 F (36.8 C)  TempSrc: Oral Oral  Oral  Resp: 18 18  16   Weight:  78.744 kg (173 lb 9.6 oz)    SpO2: 100% 99%  96%    Exam:  General:  Pt is alert, not in acute distress  HEENT: No icterus, No thrush, oral mucosa moist  Cardiovascular: regular rate and rhythm, S1/S2 No murmur  Respiratory: lung sounds clear- 94 % on room air today  Abdomen: Soft, +Bowel sounds, non tender, non distended, no guarding  MSK: No LE edema, cyanosis or clubbing  Data Reviewed: Basic Metabolic Panel:  Recent Labs Lab 09/28/15 2158 09/29/15 1305 09/30/15 0315 10/01/15 0402  NA 138 139 138 138  K 5.0 4.7 4.7 4.4  CL 110 107 107 105  CO2 19* 23 21* 24  GLUCOSE 115* 103* 118* 123*  BUN 54* 58* 60* 56*  CREATININE 3.22* 3.67* 3.98* 3.55*  CALCIUM 8.6* 9.0 8.5* 8.7*  MG  --  2.1  --   --    Liver Function Tests: No results for input(s): AST, ALT, ALKPHOS, BILITOT, PROT, ALBUMIN in the last 168 hours. No results for input(s): LIPASE, AMYLASE in the last 168 hours. No results for input(s): AMMONIA in the last 168 hours. CBC:  Recent Labs Lab 09/28/15 2158  WBC 11.8*  HGB 10.5*  HCT 32.1*  MCV 94.7  PLT 231   Cardiac Enzymes:  Recent Labs Lab 09/29/15 0247 09/29/15 0953 09/29/15 1540  TROPONINI 0.74* 0.79* 0.69*   BNP (last 3 results)  Recent Labs  05/07/15 0212 06/10/15 1711 09/28/15 2251  BNP 303.6* 810.7* 1281.0*    ProBNP (last 3 results) No results for input(s): PROBNP in the last 8760 hours.  CBG:  Recent Labs Lab 09/30/15 1230 09/30/15 1617 09/30/15 2130 10/01/15 1015 10/01/15 1119  GLUCAP 97 128* 168* 177* 162*    Recent Results (from the past 240 hour(s))  Blood culture (routine x 2)     Status: None (Preliminary result)   Collection Time: 09/28/15 10:40 PM   Result Value Ref Range Status   Specimen Description BLOOD RIGHT ANTECUBITAL  Final   Special Requests BOTTLES DRAWN AEROBIC AND ANAEROBIC 5CC   Final   Culture NO GROWTH 3 DAYS  Final   Report Status PENDING  Incomplete  Blood culture (routine x 2)     Status: None (Preliminary result)   Collection Time: 09/28/15 10:44 PM  Result Value Ref Range Status   Specimen Description BLOOD RIGHT FOREARM  Final   Special Requests BOTTLES DRAWN AEROBIC AND ANAEROBIC 5CC   Final  Culture NO GROWTH 3 DAYS  Final   Report Status PENDING  Incomplete  Urine culture     Status: None   Collection Time: 09/29/15 12:15 AM  Result Value Ref Range Status   Specimen Description URINE, CLEAN CATCH  Final   Special Requests Immunocompromised  Final   Culture 3,000 COLONIES/mL INSIGNIFICANT GROWTH  Final   Report Status 09/30/2015 FINAL  Final     Studies: No results found.  Scheduled Meds:  Scheduled Meds: . aspirin EC  81 mg Oral Daily  . dextromethorphan-guaiFENesin  1 tablet Oral BID  . furosemide  40 mg Oral BID  . heparin  5,000 Units Subcutaneous 3 times per day  . insulin aspart  0-9 Units Subcutaneous TID WC  . insulin glargine  6 Units Subcutaneous QHS  . iron polysaccharides  150 mg Oral Daily  . metoprolol tartrate  25 mg Oral BID  . pravastatin  40 mg Oral QPM  . sodium chloride  3 mL Intravenous Q12H  . tamsulosin  0.8 mg Oral QPM  . vitamin C  250 mg Oral Daily   Continuous Infusions:   Time spent on care of this patient: 35 min   Nita Sells, MD 10/01/2015, 3:18 PM  LOS: 2 days   [facility] Office  [phone] Pager - Text Page per [email or web address] If 7PM-7AM, please contact night-coverage [email or web address]

## 2015-10-02 ENCOUNTER — Encounter: Payer: Self-pay | Admitting: Family Medicine

## 2015-10-02 DIAGNOSIS — D649 Anemia, unspecified: Secondary | ICD-10-CM

## 2015-10-02 DIAGNOSIS — I5033 Acute on chronic diastolic (congestive) heart failure: Secondary | ICD-10-CM

## 2015-10-02 LAB — BASIC METABOLIC PANEL
ANION GAP: 9 (ref 5–15)
BUN: 59 mg/dL — ABNORMAL HIGH (ref 6–20)
CHLORIDE: 102 mmol/L (ref 101–111)
CO2: 24 mmol/L (ref 22–32)
Calcium: 8.5 mg/dL — ABNORMAL LOW (ref 8.9–10.3)
Creatinine, Ser: 3.35 mg/dL — ABNORMAL HIGH (ref 0.61–1.24)
GFR calc Af Amer: 21 mL/min — ABNORMAL LOW (ref >60)
GFR calc non Af Amer: 18 mL/min — ABNORMAL LOW (ref >60)
GLUCOSE: 156 mg/dL — AB (ref 65–99)
POTASSIUM: 4.4 mmol/L (ref 3.5–5.1)
Sodium: 135 mmol/L (ref 135–145)

## 2015-10-02 LAB — GLUCOSE, CAPILLARY
Glucose-Capillary: 113 mg/dL — ABNORMAL HIGH (ref 65–99)
Glucose-Capillary: 136 mg/dL — ABNORMAL HIGH (ref 65–99)

## 2015-10-02 MED ORDER — FUROSEMIDE 40 MG PO TABS: 40.0000 mg | ORAL_TABLET | Freq: Two times a day (BID) | ORAL | Status: AC

## 2015-10-02 NOTE — Discharge Summary (Signed)
Physician Discharge Summary  Troy Young [id number] DOB: Apr 05, 1951 DOA: 09/28/2015  PCP: Troy Herrlich, MD  Admit date: 09/28/2015 Discharge date: 10/02/2015  Time spent: 35 minutes  Recommendations for Outpatient Follow-up:  1. Please get complete metabolic as well as CBC in 1-2 weeks 2. Patient will need education as an outpatient regarding heart failure, fluid restriction, salt restriction etc. etc. 3. Might consider augmentation of diabetic treatment however continue for now Amaryl and Levemir at prior to admission doses 4. I have prescribed Lasix and refilled x 1.  Further medications to be adjusted as an outpatient by primary care physician. Please note that lisinopril has been held given rising creatinine and renal insufficiency and this may need to be readdressed as an outpatient 5. Patient works at a [employer] in [place] and we will give him a work excuse note 6. Therapy did not recommend any PT follow-up as an outpatient   Discharge Diagnoses:  Principal Problem:   Acute respiratory failure ([place]) Active Problems:   CAP (community acquired pneumonia)   DM2 (diabetes mellitus, type 2) ([place])   CKD stage 4 due to type 2 diabetes mellitus ([facility])   Elevated troponin   Shortness of breath   HLD (hyperlipidemia)   BPH (benign prostatic hyperplasia)   Chronic anemia   CHF (congestive heart failure) ([facility])   Acute on chronic systolic congestive heart failure ([facility])   Sepsis ([place])   Fever, unspecified   Discharge Condition: Fair   Diet recommendation: Heart healthy low-salt  Filed Weights   09/30/15 0430 10/01/15 0436 10/02/15 0522  Weight: 79.47 kg (175 lb 3.2 oz) 78.744 kg (173 lb 9.6 oz) 78.658 kg (173 lb 6.6 oz)    History of present illness:    Hospital Course: Troy Young is a 64 y.o. male with a past medical history of diastolic heart failure, hypertension, hyperlipidemia, diabetes mellitus, legally blind, colon cancer, chronic kidney  disease stage IV, anemia of chronic disease. The patient presents with shortness of breath which is been going on at home for about 5-6 days. He states that his rate has increased by about 9 pounds. In the ER he admitted to having a dry cough. He was noted to have a low-grade fever. Blood work performed in the ER revealed a BNP of 1281, troponin of 1.18 and a WBC count of 11.8.   Procedures:Acute respiratory failure  - hypoxic resp failure with dyspnea on exertion is resolving - CXR reveals diffusely increased infiltrates on CXR- possibly due to CHF exacerbation and respiratory infection - mild cough   - cont to diurese and follow for symptoms-on discharge was placed on Lasix at home dosage of 40 mg twice a day - influenza negative  Active Problems: Fevers- suspected initially to be CAP (community acquired pneumonia)? - having fevers and dyspnea - no other source for infection- still no cough -will d/c Rocephin and Zithromax as no symptoms of pneumonia- possibly viral infection?   Acute on chronic diastolic CHF - BNP elevated- diffuse infiltrates on CXR - cont Lasix- Lisinopril on hold due to acute renal failure-would resume after sees PCP as OP - ECHO reveals EF of [id number], Grade 2 diastolic dysfunction, mod MR, mod TR- Mid anteroseptal, apical septal, apical inferior, apical anterior, and true apex severe hypokinesis.  AKI on CKD 4 - baseline Cr about 2-3- Cr slightly elevated at 3.22 on admission - may be related to fluid overload? Will diurese and follow Cr   -creatinine day before d/c was 3.55 -rpt in  am  V tach - had a short run of V tach yesterday - checked Bmet and Mg+ which were normal - cont metoprolol 25 bid  Elevated Troponin - no chest pain- may be due to acute resp failure and pulm edema - Troponin improving - last myoview 9/16 revealed a partially reversible defect in mid/distal anterior wall, distal inf wall and apex consistent with prior infarct and mild peri-infarct  ischemia - consulted cardiology- medical management only for now--they have also signed off as of 10/01/15  DM2 (diabetes mellitus, type 2)  - cont lantus 6 U and sesnsitive sliding scale -CBG's 123-177 -On discharge home will resume Januvia 50 daily, Lantus 8 daily at bedtime, glipizide XL 10 daily  HLD (hyperlipidemia) - cont statin  BPH (benign prostatic hyperplasia) - cont Flomax 0.4 mg   Anemia of chronic disease - stable    Consultations:  Cardiology  Discharge Exam: Filed Vitals:   10/01/15 2212 10/02/15 0522  BP: 104/58 106/62  Pulse: 70 72  Temp: 98 F (36.7 C) 98.3 F (36.8 C)  Resp: 19 18    General: Alert pleasant oriented no apparent distress No fever no chills No diarrhea No chest pain Feels good Cardiovascular: S1-S2 no murmur rub or gallop, telemetry is normal sinus rhythm Respiratory: Chest is clinically clear no added sound No lower extremity edema  Discharge Instructions   Discharge Instructions    Diet - low sodium heart healthy    Complete by:  As directed      Discharge instructions    Complete by:  As directed   It was felt that you likely had a viral infection when you were admitted and you completed a course of antibiotics which may have helped with a bacterial component at this infection . We have refilled your prescription for Lasix which you should take as directed. These get lab work at primary care physician's office within a week or 2 We would recommend that you do not drink more than 88 ounce cups of water or 1.5 L of fluid a day and you should try to adhere to a low-salt diet.  I would recommend that you niece attend your follow-up appointment in [facility] to discuss this with your primary physician. You may return to work 10/06/15 and I will write a work note for you Please monitor your blood sugar appropriately and blood pressure There have been no changes to her other medications on this hospital admission     Increase  activity slowly    Complete by:  As directed           Current Discharge Medication List    CONTINUE these medications which have CHANGED   Details  furosemide (LASIX) 40 MG tablet Take 1 tablet (40 mg total) by mouth 2 (two) times daily. Qty: 60 tablet, Refills: 1      CONTINUE these medications which have NOT CHANGED   Details  amLODipine (NORVASC) 10 MG tablet Take 10 mg by mouth daily.    aspirin EC 81 MG tablet Take 81 mg by mouth daily.    glipiZIDE (GLUCOTROL XL) 10 MG 24 hr tablet Take 10 mg by mouth daily with breakfast.    insulin glargine (LANTUS) 100 UNIT/ML injection Inject 8 Units into the skin at bedtime.    iron polysaccharides (NIFEREX) 150 MG capsule Take 150 mg by mouth daily.    metoprolol tartrate (LOPRESSOR) 25 MG tablet Take 25 mg by mouth 2 (two) times daily.    pravastatin (  PRAVACHOL) 40 MG tablet Take 40 mg by mouth every evening.    sitaGLIPtin (JANUVIA) 50 MG tablet Take 50 mg by mouth daily.    tamsulosin (FLOMAX) 0.4 MG CAPS capsule Take 0.8 mg by mouth every evening.    vitamin C (ASCORBIC ACID) 250 MG tablet Take 250 mg by mouth daily.       No Known Allergies Follow-up Information    Follow up with [facility].   Specialty:  Home Health Services   Why:  They will do your home health care at your home   Contact information:   [place] [address] [phone]       Follow up with Troy Herrlich, MD.   Specialty:  Family Medicine   Contact information:   [place] [address] [phone]        The results of significant diagnostics from this hospitalization (including imaging, microbiology, ancillary and laboratory) are listed below for reference.    Significant Diagnostic Studies: Dg Chest 2 View  09/28/2015  CLINICAL DATA:  Acute onset of worsening shortness of breath on exertion. Dry cough. Abdominal swelling and diaphoresis. Initial  encounter. EXAM: CHEST  2 VIEW COMPARISON:  Chest radiograph performed 06/13/2015 FINDINGS: The lungs are well-aerated. Mild vascular congestion is noted. Mildly increased interstitial markings raise concern for mild interstitial edema, though minimal pneumonia might have a similar appearance. There is no evidence of pleural effusion or pneumothorax. The heart is borderline normal in size. No acute osseous abnormalities are seen. IMPRESSION: Mild vascular congestion noted. Mildly increased interstitial markings raise concern for mild interstitial edema, though minimal pneumonia might have a similar appearance. Electronically Signed   By: Garald Balding M.D.   On: 09/28/2015 22:05    Microbiology: Recent Results (from the past 240 hour(s))  Blood culture (routine x 2)     Status: None (Preliminary result)   Collection Time: 09/28/15 10:40 PM  Result Value Ref Range Status   Specimen Description BLOOD RIGHT ANTECUBITAL  Final   Special Requests BOTTLES DRAWN AEROBIC AND ANAEROBIC 5CC   Final   Culture NO GROWTH 3 DAYS  Final   Report Status PENDING  Incomplete  Blood culture (routine x 2)     Status: None (Preliminary result)   Collection Time: 09/28/15 10:44 PM  Result Value Ref Range Status   Specimen Description BLOOD RIGHT FOREARM  Final   Special Requests BOTTLES DRAWN AEROBIC AND ANAEROBIC 5CC   Final   Culture NO GROWTH 3 DAYS  Final   Report Status PENDING  Incomplete  Urine culture     Status: None   Collection Time: 09/29/15 12:15 AM  Result Value Ref Range Status   Specimen Description URINE, CLEAN CATCH  Final   Special Requests Immunocompromised  Final   Culture 3,000 COLONIES/mL INSIGNIFICANT GROWTH  Final   Report Status 09/30/2015 FINAL  Final     Labs: Basic Metabolic Panel:  Recent Labs Lab 09/28/15 2158 09/29/15 1305 09/30/15 0315 10/01/15 0402 10/02/15 0216  NA 138 139 138 138 135  K 5.0 4.7 4.7 4.4 4.4  CL 110 107 107 105 102  CO2 19* 23 21* 24 24  GLUCOSE  115* 103* 118* 123* 156*  BUN 54* 58* 60* 56* 59*  CREATININE 3.22* 3.67* 3.98* 3.55* 3.35*  CALCIUM 8.6* 9.0 8.5* 8.7* 8.5*  MG  --  2.1  --   --   --  Liver Function Tests: No results for input(s): AST, ALT, ALKPHOS, BILITOT, PROT, ALBUMIN in the last 168 hours. No results for input(s): LIPASE, AMYLASE in the last 168 hours. No results for input(s): AMMONIA in the last 168 hours. CBC:  Recent Labs Lab 09/28/15 2158  WBC 11.8*  HGB 10.5*  HCT 32.1*  MCV 94.7  PLT 231   Cardiac Enzymes:  Recent Labs Lab 09/29/15 0247 09/29/15 0953 09/29/15 1540  TROPONINI 0.74* 0.79* 0.69*   BNP: BNP (last 3 results)  Recent Labs  05/07/15 0212 06/10/15 1711 09/28/15 2251  BNP 303.6* 810.7* 1281.0*    ProBNP (last 3 results) No results for input(s): PROBNP in the last 8760 hours.  CBG:  Recent Labs Lab 10/01/15 1015 10/01/15 1119 10/01/15 1655 10/01/15 2210 10/02/15 0645  GLUCAP 177* 162* 149* 129* 113*       Signed:  Nita Sells  [facility] 10/02/2015, 8:46 AM

## 2015-10-02 NOTE — Progress Notes (Signed)
Pt has orders to be discharged. Discharge instructions given and pt has no additional questions at this time. Medication regimen reviewed and pt educated. Pt verbalized understanding and has no additional questions. Telemetry box removed. IV removed and site in good condition. Pt stable and waiting for transportation.   Dublin Cantero RN 

## 2015-10-03 LAB — CULTURE, BLOOD (ROUTINE X 2)
CULTURE: NO GROWTH
Culture: NO GROWTH

## 2015-10-07 ENCOUNTER — Telehealth (HOSPITAL_COMMUNITY): Payer: Self-pay | Admitting: Surgery

## 2015-10-07 NOTE — Telephone Encounter (Signed)
Heart Failure Nurse Navigator Post Discharge Telephone Call I called to check on Troy Young after his recent hospitalization  He tells me that he is doing "real well".  He has not yet purchased a "talking scale" and has not been weighing each day.  I encouraged him to buy a scale and reinforced the need for daily weights and how weight gains relate to the signs and symptoms of HF.  He does say that he has not had any further SOB or swelling.  He denies any issues with getting or taking prescribed medications.  His sister helps him with his medications at home.  He has a follow-up appt on Thursday with "a new PCP" (he could not remember name) and appt is not in [facility].  I encouraged him to call me back with any issues or concerns reagrding his HF.

## 2015-11-07 ENCOUNTER — Telehealth: Payer: Self-pay

## 2015-11-10 ENCOUNTER — Encounter: Payer: Self-pay | Admitting: Family Medicine

## 2015-11-10 ENCOUNTER — Ambulatory Visit (INDEPENDENT_AMBULATORY_CARE_PROVIDER_SITE_OTHER): Payer: Medicare HMO | Admitting: Family Medicine

## 2015-11-10 VITALS — BP 117/67 | HR 69 | Temp 97.9°F | Resp 16 | Ht 67.5 in | Wt 172.8 lb

## 2015-11-10 DIAGNOSIS — E7800 Pure hypercholesterolemia, unspecified: Secondary | ICD-10-CM | POA: Diagnosis not present

## 2015-11-10 DIAGNOSIS — E113513 Type 2 diabetes mellitus with proliferative diabetic retinopathy with macular edema, bilateral: Secondary | ICD-10-CM | POA: Diagnosis not present

## 2015-11-10 DIAGNOSIS — C187 Malignant neoplasm of sigmoid colon: Secondary | ICD-10-CM

## 2015-11-10 DIAGNOSIS — I5032 Chronic diastolic (congestive) heart failure: Secondary | ICD-10-CM

## 2015-11-10 DIAGNOSIS — N184 Chronic kidney disease, stage 4 (severe): Secondary | ICD-10-CM

## 2015-11-10 DIAGNOSIS — N400 Enlarged prostate without lower urinary tract symptoms: Secondary | ICD-10-CM

## 2015-11-10 DIAGNOSIS — H540 Blindness, both eyes: Secondary | ICD-10-CM

## 2015-11-10 DIAGNOSIS — Z0000 Encounter for general adult medical examination without abnormal findings: Secondary | ICD-10-CM

## 2015-11-10 DIAGNOSIS — Z794 Long term (current) use of insulin: Secondary | ICD-10-CM | POA: Diagnosis not present

## 2015-11-10 DIAGNOSIS — H543 Unqualified visual loss, both eyes: Secondary | ICD-10-CM

## 2015-11-10 DIAGNOSIS — R931 Abnormal findings on diagnostic imaging of heart and coronary circulation: Secondary | ICD-10-CM

## 2015-11-10 DIAGNOSIS — Z23 Encounter for immunization: Secondary | ICD-10-CM

## 2015-11-10 DIAGNOSIS — E1139 Type 2 diabetes mellitus with other diabetic ophthalmic complication: Secondary | ICD-10-CM

## 2015-11-10 DIAGNOSIS — Z1159 Encounter for screening for other viral diseases: Secondary | ICD-10-CM | POA: Diagnosis not present

## 2015-11-10 DIAGNOSIS — E1122 Type 2 diabetes mellitus with diabetic chronic kidney disease: Secondary | ICD-10-CM

## 2015-11-10 DIAGNOSIS — R972 Elevated prostate specific antigen [PSA]: Secondary | ICD-10-CM

## 2015-11-10 LAB — CBC WITH DIFFERENTIAL/PLATELET
BASOS PCT: 1 % (ref 0–1)
Basophils Absolute: 0.1 10*3/uL (ref 0.0–0.1)
EOS ABS: 0.2 10*3/uL (ref 0.0–0.7)
EOS PCT: 3 % (ref 0–5)
HCT: 38.5 % — ABNORMAL LOW (ref 39.0–52.0)
Hemoglobin: 12.5 g/dL — ABNORMAL LOW (ref 13.0–17.0)
Lymphocytes Relative: 29 % (ref 12–46)
Lymphs Abs: 2.2 10*3/uL (ref 0.7–4.0)
MCH: 30.5 pg (ref 26.0–34.0)
MCHC: 32.5 g/dL (ref 30.0–36.0)
MCV: 93.9 fL (ref 78.0–100.0)
MONO ABS: 0.4 10*3/uL (ref 0.1–1.0)
MPV: 11.4 fL (ref 8.6–12.4)
Monocytes Relative: 5 % (ref 3–12)
Neutro Abs: 4.7 10*3/uL (ref 1.7–7.7)
Neutrophils Relative %: 62 % (ref 43–77)
PLATELETS: 288 10*3/uL (ref 150–400)
RBC: 4.1 MIL/uL — AB (ref 4.22–5.81)
RDW: 14 % (ref 11.5–15.5)
WBC: 7.6 10*3/uL (ref 4.0–10.5)

## 2015-11-10 LAB — COMPREHENSIVE METABOLIC PANEL
ALT: 17 U/L (ref 9–46)
AST: 17 U/L (ref 10–35)
Albumin: 4.1 g/dL (ref 3.6–5.1)
Alkaline Phosphatase: 95 U/L (ref 40–115)
BILIRUBIN TOTAL: 0.9 mg/dL (ref 0.2–1.2)
BUN: 73 mg/dL — ABNORMAL HIGH (ref 7–25)
CALCIUM: 9.2 mg/dL (ref 8.6–10.3)
CO2: 23 mmol/L (ref 20–31)
Chloride: 101 mmol/L (ref 98–110)
Creat: 3.6 mg/dL — ABNORMAL HIGH (ref 0.70–1.25)
GLUCOSE: 106 mg/dL — AB (ref 65–99)
Potassium: 4.4 mmol/L (ref 3.5–5.3)
SODIUM: 140 mmol/L (ref 135–146)
Total Protein: 8.5 g/dL — ABNORMAL HIGH (ref 6.1–8.1)

## 2015-11-10 LAB — PSA, MEDICARE: PSA: 22.79 ng/mL — AB (ref <=4.00)

## 2015-11-10 LAB — POCT URINALYSIS DIP (MANUAL ENTRY)
Bilirubin, UA: NEGATIVE
Glucose, UA: NEGATIVE
Ketones, POC UA: NEGATIVE
Leukocytes, UA: NEGATIVE
Nitrite, UA: NEGATIVE
SPEC GRAV UA: 1.015
UROBILINOGEN UA: 0.2
pH, UA: 5

## 2015-11-10 LAB — LIPID PANEL
CHOL/HDL RATIO: 5.4 ratio — AB (ref <=5.0)
CHOLESTEROL: 177 mg/dL (ref 125–200)
HDL: 33 mg/dL — ABNORMAL LOW (ref >=40)
LDL Cholesterol: 107 mg/dL (ref <130)
Triglycerides: 187 mg/dL — ABNORMAL HIGH (ref <150)
VLDL: 37 mg/dL — AB (ref <30)

## 2015-11-10 LAB — HEMOGLOBIN A1C
Hgb A1c MFr Bld: 5.9 % — ABNORMAL HIGH (ref <5.7)
Mean Plasma Glucose: 123 mg/dL — ABNORMAL HIGH (ref <117)

## 2015-11-10 LAB — TSH: TSH: 3.302 u[IU]/mL (ref 0.350–4.500)

## 2015-11-10 LAB — HEPATITIS C ANTIBODY: HCV Ab: NEGATIVE

## 2015-11-10 NOTE — Patient Instructions (Signed)

## 2015-11-10 NOTE — Progress Notes (Signed)
Subjective:    Patient ID: Troy Young, male    DOB: Jul 05, 1951, 65 y.o.   MRN: [id number]  11/10/2015  Annual Exam   HPI This 65 y.o. male presents for Annual Wellness Examination.  Actually, patient and sister not sure who scheduled this appointment.  Has recently established with new PCP on [place]; just saw this NP last week; desires to continue seeing NP.  Desires to undergo Annnual Wellness Examination today since here.  Last physical:  2015 Colonoscopy:  2015 [facility]; cancerous polyp; repeat in 2017 scheduled. TDAP:  Not sure Pneumovax:  Prevnar 2015; Pneumovax 2012 Zostavax:none Influenza:  Agreeable today; has not received this year. Eye exam:  One year ago; diabetic retinopathy; [place]; once per year or twice per year. Dental exam: dentures  [place]--- new PCP located at this address; sees NP.  PCP: switched physicians in 09/2015 to a new provider due to transportation.  New provider is in [place].  Saw her in October and November.  Scheduled to see her in March 2017.    CHF: followed by Hochrein.  Thought today's appointment was with Hochrein.     Review of Systems  Constitutional: Negative for fever, chills, diaphoresis, activity change, appetite change, fatigue and unexpected weight change.  HENT: Negative for congestion, dental problem, drooling, ear discharge, ear pain, facial swelling, hearing loss, mouth sores, nosebleeds, postnasal drip, rhinorrhea, sinus pressure, sneezing, sore throat, tinnitus, trouble swallowing and voice change.   Eyes: Positive for visual disturbance. Negative for photophobia, pain, discharge, redness and itching.  Respiratory: Negative for apnea, cough, choking, chest tightness, shortness of breath, wheezing and stridor.   Cardiovascular: Negative for chest pain, palpitations and leg swelling.  Gastrointestinal: Negative for nausea, vomiting, abdominal pain, diarrhea, constipation, blood in  stool, abdominal distention, anal bleeding and rectal pain.  Endocrine: Negative for cold intolerance, heat intolerance, polydipsia, polyphagia and polyuria.  Genitourinary: Negative for dysuria, urgency, frequency, hematuria, flank pain, decreased urine volume, discharge, penile swelling, scrotal swelling, enuresis, difficulty urinating, genital sores, penile pain and testicular pain.       Nocturia x 4. Weak urinary stream chronic.  Musculoskeletal: Negative for myalgias, back pain, joint swelling, arthralgias, gait problem, neck pain and neck stiffness.  Skin: Negative for color change, pallor, rash and wound.  Allergic/Immunologic: Negative for environmental allergies, food allergies and immunocompromised state.  Neurological: Negative for dizziness, tremors, seizures, syncope, facial asymmetry, speech difficulty, weakness, light-headedness, numbness and headaches.  Hematological: Negative for adenopathy. Does not bruise/bleed easily.  Psychiatric/Behavioral: Negative for suicidal ideas, hallucinations, behavioral problems, confusion, sleep disturbance, self-injury, dysphoric mood, decreased concentration and agitation. The patient is not nervous/anxious and is not hyperactive.     Past Medical History  Diagnosis Date  . 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  Marland Kitchen DM2 (diabetes mellitus, type 2) ([place])   . Hypertension   . Hyperlipidemia   . BPH (benign prostatic hyperplasia)   . Spontaneous pneumothorax     2  . Renal insufficiency   . Anemia   . Cancer ([place]) 11/08/2013    colon cancer (malignant polyp) removed  . CHF (congestive heart failure) ([place])   . Diabetic retinopathy ([place])     legally blind since 1995.   Past Surgical History  Procedure Laterality Date  . Polyp removed    . Eye surgery    . Cataract extraction     No Known Allergies Current Outpatient Prescriptions  Medication Sig Dispense  Refill  . Alcohol Swabs (B-D SINGLE USE SWABS REGULAR)  PADS USE THREE TIMES DAILY    . aspirin EC 81 MG tablet Take 81 mg by mouth daily.    . Blood Glucose Calibration (PRODIGY CONTROL SOLUTION) High SOLN As directed    . Blood Glucose Calibration (PRODIGY CONTROL SOLUTION) Low SOLN USE AS DIRECTED    . Blood Glucose Monitoring Suppl (PRODIGY AUTOCODE BLOOD GLUCOSE) DEVI Use as instructed    . Emollient (DIABETIDERM FOOT REJUVENATING) CREA     . furosemide (LASIX) 40 MG tablet Take 1 tablet (40 mg total) by mouth 2 (two) times daily. 60 tablet 1  . glipiZIDE (GLUCOTROL XL) 10 MG 24 hr tablet Take 10 mg by mouth daily with breakfast.    . glucose blood (PRODIGY NO CODING BLOOD GLUC) test strip TEST THREE TIMES DAILY    . insulin glargine (LANTUS) 100 UNIT/ML injection Inject 8 Units into the skin at bedtime.    . Insulin Pen Needle (BD PEN NEEDLE NANO U/F) 32G X 4 MM MISC USE AS DIRECTED NIGHTLY    . iron polysaccharides (NU-IRON) 150 MG capsule TAKE 1 CAPSULE EVERY DAY    . losartan (COZAAR) 100 MG tablet TAKE 1 TABLET ONE TIME DAILY    . metoprolol tartrate (LOPRESSOR) 25 MG tablet Take 25 mg by mouth 2 (two) times daily.    . pravastatin (PRAVACHOL) 40 MG tablet Take 40 mg by mouth every evening.    Marland Kitchen PRODIGY TWIST TOP LANCETS 28G MISC TEST THREE TIMES DAILY    . sitaGLIPtin (JANUVIA) 50 MG tablet Take 50 mg by mouth daily.    . tamsulosin (FLOMAX) 0.4 MG CAPS capsule Take 0.8 mg by mouth every evening.    . vitamin C (ASCORBIC ACID) 250 MG tablet Take 250 mg by mouth daily.    Marland Kitchen amLODipine (NORVASC) 10 MG tablet      No current facility-administered medications for this visit.   Social History   Social History  . Marital Status: Divorced    Spouse Name: N/A  . Number of Children: 2  . Years of Education: N/A   Occupational History  . Not on file.   Social History Main Topics  . Smoking status: Former Smoker -- 0.00 packs/day for 0 years    Types: Cigarettes  . Smokeless tobacco: Not on file     Comment: Quit early 72s  .  Alcohol Use: No  . Drug Use: Not on file  . Sexual Activity: Not Currently   Other Topics Concern  . Not on file   Social History Narrative   Marital status: single      Children: 2 children; 2 grandchildren      Lives with sister and niece.        Employment:  Works at [employer] five days per week; 40 hours/week.      Tobacco; none; quit 20 years ago      Alcohol: none; quit drinking 20 years ago      Drugs: previous drugs 20 years ago cocaine, marijiuana; no iv drug use.       Exercise: sporadic      Independent:  Bathes self and dresses self; no cleaning of room at all; must have assistance; no cooking; no driving.  Has a cane.      Advanced Directives: no living will.  Sister is HCPOA; FULL CODE;    Family History  Problem Relation Age of Onset  . Cancer Father 73  prostate cancer  . Ulcers Mother   . Diabetes Sister   . Hypertension Sister   . Cancer Brother     lung cancer       Objective:    BP 117/67 mmHg  Pulse 69  Temp(Src) 97.9 F (36.6 C) (Oral)  Resp 16  Ht 5' 7.5" (1.715 m)  Wt 172 lb 12.8 oz (78.382 kg)  BMI 26.65 kg/m2  SpO2 97% Physical Exam  Constitutional: He is oriented to person, place, and time. He appears well-developed and well-nourished. No distress.  HENT:  Head: Normocephalic and atraumatic.  Right Ear: External ear normal.  Left Ear: External ear normal.  Nose: Nose normal.  Mouth/Throat: Oropharynx is clear and moist.  Eyes: Conjunctivae and EOM are normal. Pupils are equal, round, and reactive to light.  Neck: Normal range of motion. Neck supple. Carotid bruit is not present. No thyromegaly present.  Cardiovascular: Normal rate, regular rhythm, normal heart sounds and intact distal pulses.  Exam reveals no gallop and no friction rub.   No murmur heard. Pulmonary/Chest: Effort normal and breath sounds normal. He has no wheezes. He has no rales.  Abdominal: Soft. Bowel sounds are normal. He exhibits no distension and no  mass. There is no tenderness. There is no rebound and no guarding. Hernia confirmed negative in the right inguinal area and confirmed negative in the left inguinal area.  Genitourinary: Rectum normal, prostate normal, testes normal and penis normal.  Musculoskeletal:       Right shoulder: Normal.       Left shoulder: Normal.       Cervical back: Normal.  Lymphadenopathy:    He has no cervical adenopathy.       Right: No inguinal adenopathy present.       Left: No inguinal adenopathy present.  Neurological: He is alert and oriented to person, place, and time. He has normal reflexes. No cranial nerve deficit. He exhibits normal muscle tone. Coordination normal.  Skin: Skin is warm and dry. No rash noted. He is not diaphoretic.  Dystrophic nails B feet.    Psychiatric: He has a normal mood and affect. His behavior is normal. Judgment and thought content normal.        Assessment & Plan:   1. Encounter for Medicare annual wellness exam   2. Need for prophylactic vaccination and inoculation against influenza   3. Type 2 diabetes mellitus with both eyes affected by proliferative retinopathy and macular edema, with long-term current use of insulin (HCC)   4. Hypercholesterolemia   5. BPH (benign prostatic hyperplasia)   6. Abnormal echocardiogram   7. Need for hepatitis C screening test   8. 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 Chronic diastolic CHF (congestive heart failure) ([place])   10. Malignant neoplasm of sigmoid colon ([place])    1.  Medicare annual wellness exam: -anticipatory guidance provided --- exercise, weight maintenance, ASA 81mg  daily. -Immunizations reviewed; s/p flu vaccine in office; declined rx for Zostavax. -Colonoscopy UTD; will warrant repeat in 2017. -No advanced directives but desires FULL CODE. -moderate fall risk due to blindness from diabetic retinopathy.   -no evidence of depression; normal hearing.   -Needs assistance with ADLs due to  blindness.  Good support from sister.  2.  DMII: stable; obtain labs; continue current medications and follow-up with PCP; eye exam UTD. 3.  Hypercholesterolemia: stable; obtain labs; continue current medications. 4.  BPH: stable; obtain labs.  Continue current medications. 5.  Abnormal echo/chronic diastolic CHF: stable; due to follow-up with Dr. Percival Spanish in 01/2016; will help coordinate scheduling follow-up. 6.  Screening hepatitis C: obtain labs per CDC guidelines. 7. S/p flu vaccine. 8. Sigmoid colon cancer: to undergo repeat colonoscopy by [facility] this year per [facility].    Orders Placed This Encounter  Procedures  . Flu Vaccine QUAD 36+ mos IM  . CBC with Differential/Platelet  . Comprehensive metabolic panel    Order Specific Question:  Has the patient fasted?    Answer:  Yes  . Hemoglobin A1c  . Lipid panel    Order Specific Question:  Has the patient fasted?    Answer:  Yes  . Microalbumin, urine  . TSH  . PSA, Medicare  . Hepatitis C antibody  . Ambulatory referral to Cardiology    Referral Priority:  Routine    Referral Type:  Consultation    Referral Reason:  Specialty Services Required    Referred to Provider:  Minus Breeding, MD    Requested Specialty:  Cardiology    Number of Visits Requested:  1  . POCT urinalysis dipstick   Meds ordered this encounter  Medications  . Alcohol Swabs (B-D SINGLE USE SWABS REGULAR) PADS    Sig: USE THREE TIMES DAILY  . Insulin Pen Needle (BD PEN NEEDLE NANO U/F) 32G X 4 MM MISC    Sig: USE AS DIRECTED NIGHTLY  . Blood Glucose Calibration (PRODIGY CONTROL SOLUTION) High SOLN    Sig: As directed  . Blood Glucose Monitoring Suppl (PRODIGY AUTOCODE BLOOD GLUCOSE) DEVI    Sig: Use as instructed  . Emollient (DIABETIDERM FOOT REJUVENATING) CREA    Sig:   . Blood Glucose Calibration (PRODIGY CONTROL SOLUTION) Low SOLN    Sig: USE AS DIRECTED  . glucose blood (PRODIGY NO CODING BLOOD GLUC) test strip    Sig: TEST THREE  TIMES DAILY  . PRODIGY TWIST TOP LANCETS 28G MISC    Sig: TEST THREE TIMES DAILY  . losartan (COZAAR) 100 MG tablet    Sig: TAKE 1 TABLET ONE TIME DAILY  . DISCONTD: amLODipine (NORVASC) 10 MG tablet    Sig: TAKE 1 TABLET ONE TIME DAILY  . iron polysaccharides (NU-IRON) 150 MG capsule    Sig: TAKE 1 CAPSULE EVERY DAY  . amLODipine (NORVASC) 10 MG tablet    Sig:     No Follow-up on file.    Ashiya Kinkead Elayne Guerin, M.D. [facility] [address] [place], [place]  [address] [phone] phone [phone] fax

## 2015-11-11 ENCOUNTER — Encounter: Payer: Self-pay | Admitting: Family Medicine

## 2015-11-11 DIAGNOSIS — E7800 Pure hypercholesterolemia, unspecified: Secondary | ICD-10-CM | POA: Insufficient documentation

## 2015-11-11 DIAGNOSIS — H543 Unqualified visual loss, both eyes: Secondary | ICD-10-CM

## 2015-11-11 DIAGNOSIS — E1139 Type 2 diabetes mellitus with other diabetic ophthalmic complication: Secondary | ICD-10-CM | POA: Insufficient documentation

## 2015-11-11 LAB — MICROALBUMIN, URINE: Microalb, Ur: 20.2 mg/dL

## 2015-11-19 NOTE — Addendum Note (Signed)
Addended by: Wardell Honour on: 11/19/2015 09:19 AM   Modules accepted: Orders

## 2015-12-12 ENCOUNTER — Telehealth: Payer: Self-pay | Admitting: Cardiology

## 2015-12-12 NOTE — Telephone Encounter (Signed)
Received records from [facility] for appointment with Dr Percival Spanish on 12/18/15.  Records given to [facility] (medical records) for Dr Hochrein's schedule on 12/18/15. lp

## 2015-12-18 ENCOUNTER — Ambulatory Visit: Payer: Medicare HMO | Admitting: Cardiology

## 2016-01-27 ENCOUNTER — Ambulatory Visit (INDEPENDENT_AMBULATORY_CARE_PROVIDER_SITE_OTHER): Payer: Medicare HMO | Admitting: Sports Medicine

## 2016-01-27 ENCOUNTER — Encounter: Payer: Self-pay | Admitting: Sports Medicine

## 2016-01-27 DIAGNOSIS — E1139 Type 2 diabetes mellitus with other diabetic ophthalmic complication: Secondary | ICD-10-CM | POA: Diagnosis not present

## 2016-01-27 DIAGNOSIS — H540 Blindness, both eyes: Secondary | ICD-10-CM

## 2016-01-27 DIAGNOSIS — H543 Unqualified visual loss, both eyes: Secondary | ICD-10-CM

## 2016-01-27 DIAGNOSIS — B351 Tinea unguium: Secondary | ICD-10-CM

## 2016-01-27 DIAGNOSIS — M79672 Pain in left foot: Secondary | ICD-10-CM

## 2016-01-27 DIAGNOSIS — M79671 Pain in right foot: Secondary | ICD-10-CM | POA: Diagnosis not present

## 2016-01-27 DIAGNOSIS — B353 Tinea pedis: Secondary | ICD-10-CM

## 2016-01-27 MED ORDER — CLOTRIMAZOLE 1 % EX CREA: 1.0000 "application " | TOPICAL_CREAM | Freq: Two times a day (BID) | CUTANEOUS | Status: DC

## 2016-01-27 NOTE — Progress Notes (Signed)
Patient ID: Troy Young, male   DOB: 06/04/1951, 65 y.o.   MRN: [id number] Subjective: Troy Young is a 65 y.o. male patient with history of diabetes who presents to office today complaining of long, painful nails  while ambulating in shoes; unable to trim. Patient states that the glucose reading this morning was 120 mg/dl. Patient denies any new changes in medication or new problems. Patient denies any new cramping, numbness, burning or tingling in the legs.  Admits to blindness and uses walking cane.   Patient Active Problem List   Diagnosis Date Noted  . 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 Hypercholesterolemia 11/11/2015  . Fever, unspecified   . HLD (hyperlipidemia) 09/29/2015  . BPH (benign prostatic hyperplasia) 09/29/2015  . Chronic anemia 09/29/2015  . Chronic diastolic CHF (congestive heart failure) ([place]) 09/29/2015  . CHF (congestive heart failure) ([place]) 09/29/2015  . SOB (shortness of breath) 09/29/2015  . Sepsis ([place]) 09/29/2015  . Acute respiratory failure ([place]) 09/29/2015  . Acute on chronic systolic congestive heart failure ([place])   . Abnormal echocardiogram 07/01/2015  . Shortness of breath 06/10/2015  . Dyspnea 06/10/2015  . Elevated troponin   . CAP (community acquired pneumonia) 05/07/2015  . DM2 (diabetes mellitus, type 2) ([place]) 05/07/2015  . HTN (hypertension) 05/07/2015  . CKD stage 4 due to type 2 diabetes mellitus ([place]) 05/07/2015  . Community acquired pneumonia   . Iron deficiency 05/05/2015  . Type 2 diabetes mellitus with diabetic nephropathy ([place]) 08/16/2014  . Malignant neoplasm of sigmoid colon ([place]) 08/08/2014  . Malignant neoplasm of large intestine ([place]) 08/08/2014  . Impaired renal function 08/30/2013  . Detached retina 03/19/2013  . Blindness, legal 03/19/2013  . Diabetes mellitus ([place]) 03/12/2013  . BP (high blood pressure) 03/12/2013   Current Outpatient Prescriptions on File Prior to Visit  Medication Sig  Dispense Refill  . Alcohol Swabs (B-D SINGLE USE SWABS REGULAR) PADS USE THREE TIMES DAILY    . amLODipine (NORVASC) 10 MG tablet     . aspirin EC 81 MG tablet Take 81 mg by mouth daily.    . Blood Glucose Calibration (PRODIGY CONTROL SOLUTION) High SOLN As directed    . Blood Glucose Calibration (PRODIGY CONTROL SOLUTION) Low SOLN USE AS DIRECTED    . Blood Glucose Monitoring Suppl (PRODIGY AUTOCODE BLOOD GLUCOSE) DEVI Use as instructed    . Emollient (DIABETIDERM FOOT REJUVENATING) CREA     . furosemide (LASIX) 40 MG tablet Take 1 tablet (40 mg total) by mouth 2 (two) times daily. 60 tablet 1  . glipiZIDE (GLUCOTROL XL) 10 MG 24 hr tablet Take 10 mg by mouth daily with breakfast.    . glucose blood (PRODIGY NO CODING BLOOD GLUC) test strip TEST THREE TIMES DAILY    . insulin glargine (LANTUS) 100 UNIT/ML injection Inject 8 Units into the skin at bedtime.    . Insulin Pen Needle (BD PEN NEEDLE NANO U/F) 32G X 4 MM MISC USE AS DIRECTED NIGHTLY    . iron polysaccharides (NU-IRON) 150 MG capsule TAKE 1 CAPSULE EVERY DAY    . losartan (COZAAR) 100 MG tablet TAKE 1 TABLET ONE TIME DAILY    . metoprolol tartrate (LOPRESSOR) 25 MG tablet Take 25 mg by mouth 2 (two) times daily.    . pravastatin (PRAVACHOL) 40 MG tablet Take 40 mg by mouth every evening.    Marland Kitchen PRODIGY TWIST TOP LANCETS 28G MISC TEST THREE TIMES DAILY    . sitaGLIPtin (JANUVIA)  50 MG tablet Take 50 mg by mouth daily.    . tamsulosin (FLOMAX) 0.4 MG CAPS capsule Take 0.8 mg by mouth every evening.    . vitamin C (ASCORBIC ACID) 250 MG tablet Take 250 mg by mouth daily.     No current facility-administered medications on file prior to visit.   No Known Allergies  Recent Results (from the past 2160 hour(s))  CBC with Differential/Platelet     Status: Abnormal   Collection Time: 11/10/15  2:49 PM  Result Value Ref Range   WBC 7.6 4.0 - 10.5 K/uL   RBC 4.10 (L) 4.22 - 5.81 MIL/uL   Hemoglobin 12.5 (L) 13.0 - 17.0 g/dL   HCT 38.5  (L) 39.0 - 52.0 %   MCV 93.9 78.0 - 100.0 fL   MCH 30.5 26.0 - 34.0 pg   MCHC 32.5 30.0 - 36.0 g/dL   RDW 14.0 11.5 - 15.5 %   Platelets 288 150 - 400 K/uL   MPV 11.4 8.6 - 12.4 fL   Neutrophils Relative % 62 43 - 77 %   Neutro Abs 4.7 1.7 - 7.7 K/uL   Lymphocytes Relative 29 12 - 46 %   Lymphs Abs 2.2 0.7 - 4.0 K/uL   Monocytes Relative 5 3 - 12 %   Monocytes Absolute 0.4 0.1 - 1.0 K/uL   Eosinophils Relative 3 0 - 5 %   Eosinophils Absolute 0.2 0.0 - 0.7 K/uL   Basophils Relative 1 0 - 1 %   Basophils Absolute 0.1 0.0 - 0.1 K/uL   Smear Review Criteria for review not met   Comprehensive metabolic panel     Status: Abnormal   Collection Time: 11/10/15  2:49 PM  Result Value Ref Range   Sodium 140 135 - 146 mmol/L   Potassium 4.4 3.5 - 5.3 mmol/L   Chloride 101 98 - 110 mmol/L   CO2 23 20 - 31 mmol/L   Glucose, Bld 106 (H) 65 - 99 mg/dL   BUN 73 (H) 7 - 25 mg/dL   Creat 3.60 (H) 0.70 - 1.25 mg/dL   Total Bilirubin 0.9 0.2 - 1.2 mg/dL   Alkaline Phosphatase 95 40 - 115 U/L   AST 17 10 - 35 U/L   ALT 17 9 - 46 U/L   Total Protein 8.5 (H) 6.1 - 8.1 g/dL   Albumin 4.1 3.6 - 5.1 g/dL   Calcium 9.2 8.6 - 10.3 mg/dL  Hemoglobin A1c     Status: Abnormal   Collection Time: 11/10/15  2:49 PM  Result Value Ref Range   Hgb A1c MFr Bld 5.9 (H) <5.7 %    Comment:                                                                        According to the ADA Clinical Practice Recommendations for 2011, when HbA1c is used as a screening test:     >=6.5%   Diagnostic of Diabetes Mellitus            (if abnormal result is confirmed)   5.7-6.4%   Increased risk of developing Diabetes Mellitus   References:Diagnosis and Classification of Diabetes Mellitus,Diabetes Care,2011,34(Suppl 1):S62-S69 and Standards of Medical Care in  Diabetes - 2011,Diabetes Care,2011,34 (Suppl 1):S11-S61.      Mean Plasma Glucose 123 (H) <117 mg/dL  Lipid panel     Status: Abnormal   Collection Time:  11/10/15  2:49 PM  Result Value Ref Range   Cholesterol 177 125 - 200 mg/dL   Triglycerides 187 (H) <150 mg/dL   HDL 33 (L) >=40 mg/dL   Total CHOL/HDL Ratio 5.4 (H) <=5.0 Ratio   VLDL 37 (H) <30 mg/dL   LDL Cholesterol 107 <130 mg/dL    Comment:   Total Cholesterol/HDL Ratio:CHD Risk                        Coronary Heart Disease Risk Table                                        Men       Women          1/2 Average Risk              3.4        3.3              Average Risk              5.0        4.4           2X Average Risk              9.6        7.1           3X Average Risk             23.4       11.0 Use the calculated Patient Ratio above and the CHD Risk table  to determine the patient's CHD Risk.   TSH     Status: None   Collection Time: 11/10/15  2:49 PM  Result Value Ref Range   TSH 3.302 0.350 - 4.500 uIU/mL  PSA, Medicare     Status: Abnormal   Collection Time: 11/10/15  2:49 PM  Result Value Ref Range   PSA 22.79 (H) <=4.00 ng/mL    Comment: Test Methodology: ECLIA PSA (Electrochemiluminescence Immunoassay)   For PSA values from 2.5-4.0, particularly in younger men <37 years old, the AUA and NCCN suggest testing for % Free PSA (3515) and evaluation of the rate of increase in PSA (PSA velocity).   Microalbumin, urine     Status: None   Collection Time: 11/10/15  2:52 PM  Result Value Ref Range   Microalb, Ur 20.2 Not estab mg/dL    Comment: The ADA has defined abnormalities in albumin excretion as follows:           Category           Result                            (mcg/mg creatinine)                 Normal:    <30       Microalbuminuria:    30 - 299   Clinical albuminuria:    > or = 300   The ADA recommends that at least two of three specimens collected within a 3 - 6 month period be abnormal before considering a patient  to be within a diagnostic category.     POCT urinalysis dipstick     Status: Abnormal   Collection Time: 11/10/15  2:57 PM  Result  Value Ref Range   Color, UA yellow yellow   Clarity, UA clear clear   Glucose, UA negative negative   Bilirubin, UA negative negative   Ketones, POC UA negative negative   Spec Grav, UA 1.015    Blood, UA trace-lysed (A) negative   pH, UA 5.0    Protein Ur, POC =30 (A) negative   Urobilinogen, UA 0.2    Nitrite, UA Negative Negative   Leukocytes, UA Negative Negative  Hepatitis C antibody     Status: None   Collection Time: 11/10/15  3:03 PM  Result Value Ref Range   HCV Ab NEGATIVE NEGATIVE    Objective: General: Patient is awake, alert, and oriented x 3 and in no acute distress.  Integument: Skin is warm, dry and supple bilateral. Nails are tender, long, thickened and  dystrophic with subungual debris, consistent with onychomycosis, 1-5 bilateral. Mild scaly skin bilateral plantar sufraces consistent with tinea with no acute signs of infection. No open lesions or preulcerative lesions present bilateral. Remaining integument unremarkable.  Vasculature:  Dorsalis Pedis pulse 1/4 bilateral. Posterior Tibial pulse 1 /4 bilateral.  Capillary fill time <3 sec 1-5 bilateral. No hair growth to the level of the digits. Temperature gradient within normal limits. No varicosities present bilateral. No edema present bilateral.   Neurology: The patient has intact sensation measured with a 5.07/10g Semmes Weinstein Monofilament at all pedal sites bilateral . Vibratory sensation slightly diminished bilateral with tuning fork. No Babinski sign present bilateral.   Musculoskeletal: No gross pedal deformities noted bilateral. Muscular strength 5/5 in all lower extremity muscular groups bilateral without pain on range of motion . No tenderness with calf compression bilateral.  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 -Discussed and educated patient on diabetic foot care, especially with  regards to the vascular, neurological and musculoskeletal systems.  -Stressed the importance of good glycemic control and the detriment of not  controlling glucose levels in relation to the foot. -Mechanically debrided all nails 1-5 bilateral using sterile nail nipper and filed with dremel without incident  -Rx clotrimazole cream to use daily -Answered all patient questions -Patient to return  in 3 months for at risk foot care -Patient advised to call the office if any problems or questions arise in the meantime.  Landis Martins, DPM

## 2016-01-27 NOTE — Patient Instructions (Signed)
Diabetes and Foot Care Diabetes may cause you to have problems because of poor blood supply (circulation) to your feet and legs. This may cause the skin on your feet to become thinner, break easier, and heal more slowly. Your skin may become dry, and the skin may peel and crack. You may also have nerve damage in your legs and feet causing decreased feeling in them. You may not notice minor injuries to your feet that could lead to infections or more serious problems. Taking care of your feet is one of the most important things you can do for yourself.  HOME CARE INSTRUCTIONS  Wear shoes at all times, even in the house. Do not go barefoot. Bare feet are easily injured.  Check your feet daily for blisters, cuts, and redness. If you cannot see the bottom of your feet, use a mirror or ask someone for help.  Wash your feet with warm water (do not use hot water) and mild soap. Then pat your feet and the areas between your toes until they are completely dry. Do not soak your feet as this can dry your skin.  Apply a moisturizing lotion or petroleum jelly (that does not contain alcohol and is unscented) to the skin on your feet and to dry, brittle toenails. Do not apply lotion between your toes.  Trim your toenails straight across. Do not dig under them or around the cuticle. File the edges of your nails with an emery board or nail file.  Do not cut corns or calluses or try to remove them with medicine.  Wear clean socks or stockings every day. Make sure they are not too tight. Do not wear knee-high stockings since they may decrease blood flow to your legs.  Wear shoes that fit properly and have enough cushioning. To break in new shoes, wear them for just a few hours a day. This prevents you from injuring your feet. Always look in your shoes before you put them on to be sure there are no objects inside.  Do not cross your legs. This may decrease the blood flow to your feet.  If you find a minor scrape,  cut, or break in the skin on your feet, keep it and the skin around it clean and dry. These areas may be cleansed with mild soap and water. Do not cleanse the area with peroxide, alcohol, or iodine.  When you remove an adhesive bandage, be sure not to damage the skin around it.  If you have a wound, look at it several times a day to make sure it is healing.  Do not use heating pads or hot water bottles. They may burn your skin. If you have lost feeling in your feet or legs, you may not know it is happening until it is too late.  Make sure your health care provider performs a complete foot exam at least annually or more often if you have foot problems. Report any cuts, sores, or bruises to your health care provider immediately. SEEK MEDICAL CARE IF:   You have an injury that is not healing.  You have cuts or breaks in the skin.  You have an ingrown nail.  You notice redness on your legs or feet.  You feel burning or tingling in your legs or feet.  You have pain or cramps in your legs and feet.  Your legs or feet are numb.  Your feet always feel cold. SEEK IMMEDIATE MEDICAL CARE IF:   There is increasing redness,   swelling, or pain in or around a wound.  There is a red line that goes up your leg.  Pus is coming from a wound.  You develop a fever or as directed by your health care provider.  You notice a bad smell coming from an ulcer or wound.   This information is not intended to replace advice given to you by your health care provider. Make sure you discuss any questions you have with your health care provider.   Document Released: 10/22/2000 Document Revised: 06/27/2013 Document Reviewed: 04/03/2013 Elsevier Interactive Patient Education 2016 [employer]  

## 2016-02-23 ENCOUNTER — Other Ambulatory Visit: Payer: Self-pay | Admitting: Sports Medicine

## 2016-03-18 ENCOUNTER — Other Ambulatory Visit: Payer: Self-pay | Admitting: Sports Medicine

## 2016-03-19 NOTE — Telephone Encounter (Signed)
Pt is to make a follow up appt for June 2017, and can ask for long-term refills at that time.

## 2016-04-04 ENCOUNTER — Inpatient Hospital Stay (HOSPITAL_COMMUNITY)
Admission: EM | Admit: 2016-04-04 | Discharge: 2016-05-08 | DRG: 870 | Disposition: E | Payer: Medicare HMO | Attending: Pulmonary Disease | Admitting: Pulmonary Disease

## 2016-04-04 ENCOUNTER — Emergency Department (HOSPITAL_COMMUNITY): Payer: Medicare HMO

## 2016-04-04 ENCOUNTER — Encounter (HOSPITAL_COMMUNITY): Payer: Self-pay | Admitting: *Deleted

## 2016-04-04 DIAGNOSIS — E1140 Type 2 diabetes mellitus with diabetic neuropathy, unspecified: Secondary | ICD-10-CM | POA: Diagnosis present

## 2016-04-04 DIAGNOSIS — K567 Ileus, unspecified: Secondary | ICD-10-CM | POA: Diagnosis not present

## 2016-04-04 DIAGNOSIS — E1122 Type 2 diabetes mellitus with diabetic chronic kidney disease: Secondary | ICD-10-CM | POA: Diagnosis present

## 2016-04-04 DIAGNOSIS — A419 Sepsis, unspecified organism: Principal | ICD-10-CM | POA: Diagnosis present

## 2016-04-04 DIAGNOSIS — D649 Anemia, unspecified: Secondary | ICD-10-CM | POA: Diagnosis present

## 2016-04-04 DIAGNOSIS — Z794 Long term (current) use of insulin: Secondary | ICD-10-CM

## 2016-04-04 DIAGNOSIS — R7989 Other specified abnormal findings of blood chemistry: Secondary | ICD-10-CM | POA: Diagnosis not present

## 2016-04-04 DIAGNOSIS — D72829 Elevated white blood cell count, unspecified: Secondary | ICD-10-CM

## 2016-04-04 DIAGNOSIS — E872 Acidosis, unspecified: Secondary | ICD-10-CM

## 2016-04-04 DIAGNOSIS — R310 Gross hematuria: Secondary | ICD-10-CM | POA: Diagnosis not present

## 2016-04-04 DIAGNOSIS — G9341 Metabolic encephalopathy: Secondary | ICD-10-CM | POA: Diagnosis not present

## 2016-04-04 DIAGNOSIS — J9601 Acute respiratory failure with hypoxia: Secondary | ICD-10-CM | POA: Diagnosis present

## 2016-04-04 DIAGNOSIS — R34 Anuria and oliguria: Secondary | ICD-10-CM | POA: Diagnosis not present

## 2016-04-04 DIAGNOSIS — N184 Chronic kidney disease, stage 4 (severe): Secondary | ICD-10-CM | POA: Diagnosis present

## 2016-04-04 DIAGNOSIS — I2510 Atherosclerotic heart disease of native coronary artery without angina pectoris: Secondary | ICD-10-CM | POA: Diagnosis present

## 2016-04-04 DIAGNOSIS — R0600 Dyspnea, unspecified: Secondary | ICD-10-CM | POA: Diagnosis not present

## 2016-04-04 DIAGNOSIS — Z9119 Patient's noncompliance with other medical treatment and regimen: Secondary | ICD-10-CM

## 2016-04-04 DIAGNOSIS — Z66 Do not resuscitate: Secondary | ICD-10-CM | POA: Diagnosis not present

## 2016-04-04 DIAGNOSIS — R6520 Severe sepsis without septic shock: Secondary | ICD-10-CM

## 2016-04-04 DIAGNOSIS — R062 Wheezing: Secondary | ICD-10-CM | POA: Diagnosis present

## 2016-04-04 DIAGNOSIS — R6521 Severe sepsis with septic shock: Secondary | ICD-10-CM | POA: Diagnosis not present

## 2016-04-04 DIAGNOSIS — I214 Non-ST elevation (NSTEMI) myocardial infarction: Secondary | ICD-10-CM | POA: Diagnosis present

## 2016-04-04 DIAGNOSIS — G9340 Encephalopathy, unspecified: Secondary | ICD-10-CM | POA: Insufficient documentation

## 2016-04-04 DIAGNOSIS — E119 Type 2 diabetes mellitus without complications: Secondary | ICD-10-CM

## 2016-04-04 DIAGNOSIS — Z7982 Long term (current) use of aspirin: Secondary | ICD-10-CM

## 2016-04-04 DIAGNOSIS — N400 Enlarged prostate without lower urinary tract symptoms: Secondary | ICD-10-CM | POA: Diagnosis present

## 2016-04-04 DIAGNOSIS — E1121 Type 2 diabetes mellitus with diabetic nephropathy: Secondary | ICD-10-CM | POA: Diagnosis present

## 2016-04-04 DIAGNOSIS — Z79899 Other long term (current) drug therapy: Secondary | ICD-10-CM

## 2016-04-04 DIAGNOSIS — I272 Other secondary pulmonary hypertension: Secondary | ICD-10-CM | POA: Diagnosis present

## 2016-04-04 DIAGNOSIS — I10 Essential (primary) hypertension: Secondary | ICD-10-CM | POA: Diagnosis not present

## 2016-04-04 DIAGNOSIS — E785 Hyperlipidemia, unspecified: Secondary | ICD-10-CM | POA: Diagnosis not present

## 2016-04-04 DIAGNOSIS — J690 Pneumonitis due to inhalation of food and vomit: Secondary | ICD-10-CM | POA: Diagnosis not present

## 2016-04-04 DIAGNOSIS — E1139 Type 2 diabetes mellitus with other diabetic ophthalmic complication: Secondary | ICD-10-CM | POA: Diagnosis not present

## 2016-04-04 DIAGNOSIS — I130 Hypertensive heart and chronic kidney disease with heart failure and stage 1 through stage 4 chronic kidney disease, or unspecified chronic kidney disease: Secondary | ICD-10-CM | POA: Diagnosis present

## 2016-04-04 DIAGNOSIS — J9690 Respiratory failure, unspecified, unspecified whether with hypoxia or hypercapnia: Secondary | ICD-10-CM

## 2016-04-04 DIAGNOSIS — E875 Hyperkalemia: Secondary | ICD-10-CM | POA: Diagnosis not present

## 2016-04-04 DIAGNOSIS — E11649 Type 2 diabetes mellitus with hypoglycemia without coma: Secondary | ICD-10-CM | POA: Diagnosis not present

## 2016-04-04 DIAGNOSIS — I5043 Acute on chronic combined systolic (congestive) and diastolic (congestive) heart failure: Secondary | ICD-10-CM | POA: Diagnosis present

## 2016-04-04 DIAGNOSIS — Z452 Encounter for adjustment and management of vascular access device: Secondary | ICD-10-CM

## 2016-04-04 DIAGNOSIS — I509 Heart failure, unspecified: Secondary | ICD-10-CM

## 2016-04-04 DIAGNOSIS — N179 Acute kidney failure, unspecified: Secondary | ICD-10-CM | POA: Diagnosis present

## 2016-04-04 DIAGNOSIS — I255 Ischemic cardiomyopathy: Secondary | ICD-10-CM | POA: Diagnosis present

## 2016-04-04 DIAGNOSIS — R0603 Acute respiratory distress: Secondary | ICD-10-CM

## 2016-04-04 DIAGNOSIS — Z87891 Personal history of nicotine dependence: Secondary | ICD-10-CM

## 2016-04-04 DIAGNOSIS — E1151 Type 2 diabetes mellitus with diabetic peripheral angiopathy without gangrene: Secondary | ICD-10-CM | POA: Diagnosis present

## 2016-04-04 DIAGNOSIS — I081 Rheumatic disorders of both mitral and tricuspid valves: Secondary | ICD-10-CM | POA: Diagnosis present

## 2016-04-04 DIAGNOSIS — I5023 Acute on chronic systolic (congestive) heart failure: Secondary | ICD-10-CM | POA: Diagnosis not present

## 2016-04-04 DIAGNOSIS — R778 Other specified abnormalities of plasma proteins: Secondary | ICD-10-CM | POA: Diagnosis present

## 2016-04-04 DIAGNOSIS — H548 Legal blindness, as defined in USA: Secondary | ICD-10-CM | POA: Diagnosis present

## 2016-04-04 DIAGNOSIS — E11319 Type 2 diabetes mellitus with unspecified diabetic retinopathy without macular edema: Secondary | ICD-10-CM | POA: Diagnosis present

## 2016-04-04 DIAGNOSIS — Z85038 Personal history of other malignant neoplasm of large intestine: Secondary | ICD-10-CM

## 2016-04-04 LAB — CBG MONITORING, ED: GLUCOSE-CAPILLARY: 177 mg/dL — AB (ref 65–99)

## 2016-04-04 LAB — I-STAT TROPONIN, ED
TROPONIN I, POC: 2.84 ng/mL — AB (ref 0.00–0.08)
TROPONIN I, POC: 2.38 ng/mL — AB (ref 0.00–0.08)

## 2016-04-04 LAB — COMPREHENSIVE METABOLIC PANEL
ALT: 17 U/L (ref 17–63)
ANION GAP: 11 (ref 5–15)
AST: 27 U/L (ref 15–41)
Albumin: 3.3 g/dL — ABNORMAL LOW (ref 3.5–5.0)
Alkaline Phosphatase: 70 U/L (ref 38–126)
BUN: 54 mg/dL — ABNORMAL HIGH (ref 6–20)
CHLORIDE: 110 mmol/L (ref 101–111)
CO2: 17 mmol/L — AB (ref 22–32)
CREATININE: 3.41 mg/dL — AB (ref 0.61–1.24)
Calcium: 8.7 mg/dL — ABNORMAL LOW (ref 8.9–10.3)
GFR, EST AFRICAN AMERICAN: 20 mL/min — AB (ref >60)
GFR, EST NON AFRICAN AMERICAN: 17 mL/min — AB (ref >60)
Glucose, Bld: 176 mg/dL — ABNORMAL HIGH (ref 65–99)
POTASSIUM: 4.6 mmol/L (ref 3.5–5.1)
SODIUM: 138 mmol/L (ref 135–145)
Total Bilirubin: 1 mg/dL (ref 0.3–1.2)
Total Protein: 7.5 g/dL (ref 6.5–8.1)

## 2016-04-04 LAB — CBC WITH DIFFERENTIAL/PLATELET
BASOS PCT: 0 %
Basophils Absolute: 0.1 10*3/uL (ref 0.0–0.1)
Eosinophils Absolute: 0 10*3/uL (ref 0.0–0.7)
Eosinophils Relative: 0 %
HCT: 33 % — ABNORMAL LOW (ref 39.0–52.0)
Hemoglobin: 10.4 g/dL — ABNORMAL LOW (ref 13.0–17.0)
Lymphocytes Relative: 9 %
Lymphs Abs: 1.4 10*3/uL (ref 0.7–4.0)
MCH: 30 pg (ref 26.0–34.0)
MCHC: 31.5 g/dL (ref 30.0–36.0)
MCV: 95.1 fL (ref 78.0–100.0)
MONO ABS: 0.7 10*3/uL (ref 0.1–1.0)
MONOS PCT: 4 %
NEUTROS ABS: 13.5 10*3/uL — AB (ref 1.7–7.7)
Neutrophils Relative %: 87 %
PLATELETS: 181 10*3/uL (ref 150–400)
RBC: 3.47 MIL/uL — AB (ref 4.22–5.81)
RDW: 13.5 % (ref 11.5–15.5)
WBC: 15.7 10*3/uL — ABNORMAL HIGH (ref 4.0–10.5)

## 2016-04-04 LAB — I-STAT CG4 LACTIC ACID, ED: LACTIC ACID, VENOUS: 2.81 mmol/L — AB (ref 0.5–2.0)

## 2016-04-04 LAB — BRAIN NATRIURETIC PEPTIDE: B NATRIURETIC PEPTIDE 5: 1241.1 pg/mL — AB (ref 0.0–100.0)

## 2016-04-04 MED ORDER — ASPIRIN 81 MG PO CHEW
324.0000 mg | CHEWABLE_TABLET | Freq: Once | ORAL | Status: AC
  Administered 2016-04-04: 243 mg via ORAL
  Filled 2016-04-04: qty 4

## 2016-04-04 MED ORDER — SODIUM CHLORIDE 0.9 % IV BOLUS (SEPSIS)
1000.0000 mL | Freq: Once | INTRAVENOUS | Status: AC
  Administered 2016-04-04: 1000 mL via INTRAVENOUS

## 2016-04-04 MED ORDER — DEXTROSE 5 % IV SOLN
500.0000 mg | Freq: Once | INTRAVENOUS | Status: AC
  Administered 2016-04-04: 500 mg via INTRAVENOUS
  Filled 2016-04-04: qty 500

## 2016-04-04 MED ORDER — SODIUM CHLORIDE 0.9 % IV BOLUS (SEPSIS)
1000.0000 mL | Freq: Once | INTRAVENOUS | Status: AC
  Administered 2016-04-05: 1000 mL via INTRAVENOUS

## 2016-04-04 MED ORDER — DEXTROSE 5 % IV SOLN
1.0000 g | Freq: Once | INTRAVENOUS | Status: AC
  Administered 2016-04-04: 1 g via INTRAVENOUS
  Filled 2016-04-04: qty 10

## 2016-04-04 MED ORDER — NITROGLYCERIN 0.4 MG SL SUBL
0.4000 mg | SUBLINGUAL_TABLET | SUBLINGUAL | Status: DC | PRN
Start: 2016-04-04 — End: 2016-04-14

## 2016-04-04 NOTE — H&P (Deleted)
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  Patient said that he started having SOB on Friday. This continued to get worse. It was associated with wheezing, poor exercise tolerance, but not with chest pain. Patient was previously admitted in 09/2015 at which time he weighed 184 lbs up from 171 lbs. His troponins were elevated to 0.79 w/ Cr 3.2 and BNP 1281. He did not have chest pain at that time either. He weighs 188 lbs. His dry weight according to him is 172-175 lbs. He has been compliant with his medications. Last time he checked his weight was a week ago and he was fine back then. He has been having PND, orthopnea for the last two days. He vomited while come to the hospital.  Patient denied any cough, fevers, chills.   He underwent MPI on 07/17/2015 which showed intermediate risk stress nuclear study with a large, severe, partially reversible defect in the mid/distal anterior wall, distal inferior wall and apex; findings consistent with prior infarct and mild to moderate peri-infarct ischemia; EF 46 with apical hypokinesis and mild LVE.   Problem List: Past Medical History  Diagnosis Date  . 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  Marland Kitchen DM2 (diabetes mellitus, type 2) ([place])   . Hypertension   . Hyperlipidemia   . BPH (benign prostatic hyperplasia)   . Spontaneous pneumothorax     2  . Renal insufficiency   . Anemia   . Cancer ([place]) 11/08/2013    colon cancer (malignant polyp) removed  . CHF (congestive heart failure)  ([place])   . Diabetic retinopathy ([place])     legally blind since 1995.    Past Surgical History  Procedure Laterality Date  . Polyp removed    . Eye surgery    . Cataract extraction       Allergies: No Known Allergies   Home Medications Current Facility-Administered Medications  Medication Dose Route Frequency Provider Last Rate Last Dose  . azithromycin (ZITHROMAX) 500 mg in dextrose 5 % 250 mL IVPB  500 mg Intravenous Once Gareth Morgan, MD      . cefTRIAXone (ROCEPHIN) 1 g in dextrose 5 % 50 mL IVPB  1 g Intravenous Once Gareth Morgan, MD      . nitroGLYCERIN (NITROSTAT) SL tablet 0.4 mg  0.4 mg Sublingual Q5 min PRN Gareth Morgan, MD       Current Outpatient Prescriptions  Medication Sig Dispense Refill  . amLODipine (NORVASC) 10 MG tablet Take 10 mg by mouth daily.     Marland Kitchen aspirin EC 81 MG tablet Take 81 mg by mouth daily.    . furosemide (LASIX) 40 MG tablet Take 1 tablet (40 mg total) by mouth 2 (two) times daily. 60 tablet 1  . glipiZIDE (GLUCOTROL XL) 10 MG 24 hr tablet Take 10 mg by mouth daily with breakfast.    . insulin glargine (LANTUS) 100 UNIT/ML injection Inject 9-15 Units into the skin at bedtime. On sliding scale    . losartan (COZAAR) 100 MG tablet TAKE 1 TABLET ONE TIME DAILY    .  metoprolol tartrate (LOPRESSOR) 25 MG tablet Take 25 mg by mouth daily.     . Multiple Vitamin (MULTIVITAMIN WITH MINERALS) TABS tablet Take 1 tablet by mouth daily.    . pravastatin (PRAVACHOL) 40 MG tablet Take 20 mg by mouth every evening.     . sitaGLIPtin (JANUVIA) 50 MG tablet Take 50 mg by mouth daily.    . tamsulosin (FLOMAX) 0.4 MG CAPS capsule Take 0.8 mg by mouth daily after breakfast.     . iron polysaccharides (NU-IRON) 150 MG capsule TAKE 1 CAPSULE EVERY DAY       Family History  Problem Relation Age of Onset  . Cancer Father 49    prostate cancer  . Ulcers Mother   . Diabetes Sister   . Hypertension Sister   . Cancer Brother     lung cancer     Social  History   Social History  . Marital Status: Divorced    Spouse Name: N/A  . Number of Children: 2  . Years of Education: N/A   Occupational History  . Not on file.   Social History Main Topics  . Smoking status: Former Smoker -- 0.00 packs/day for 0 years    Types: Cigarettes  . Smokeless tobacco: Not on file     Comment: Quit early 15s  . Alcohol Use: No  . Drug Use: Not on file  . Sexual Activity: Not Currently   Other Topics Concern  . Not on file   Social History Narrative   Marital status: single      Children: 2 children; 2 grandchildren      Lives with sister and niece.        Employment:  Works at [employer] five days per week; 40 hours/week.      Tobacco; none; quit 20 years ago      Alcohol: none; quit drinking 20 years ago      Drugs: previous drugs 20 years ago cocaine, marijiuana; no iv drug use.       Exercise: sporadic      Independent:  Bathes self and dresses self; no cleaning of room at all; must have assistance; no cooking; no driving.  Has a cane.      Advanced Directives: no living will.  Sister is HCPOA; FULL CODE;      Review of Systems: General: has weight gain of 14 lbs. Denied fever  Cardiovascular: chest pain, dyspnea, PND, orthopnea, dyspnea on exertion negative for edema palpitations Dermatological: negative for rash Respiratory: negative for cough or wheezing Urologic: negative for hematuria Abdominal: nausea and vomiting negative for diarrhea, bright red blood per rectum, melena, or hematemesis Neurologic: negative for visual changes, syncope, or dizziness Endocrine: no diabetes, no hypothyroidism Immunological: no lymph adenopathy Psych: non homicidal/suicidal  Physical Exam: Vitals: BP 101/59 mmHg  Pulse 95  Temp(Src) 100.4 F (38 C) (Rectal)  Resp 31  Wt 85.276 kg (188 lb)  SpO2 95% General: not in acute distress Neck: JVP elevated to the jaw Heart: tachycardiac and regular rhythm, S1, S2, no murmurs  Lungs: mild  crackles on the bases and poor air entry  GI: non tender, non distended, bowel sounds present Extremities: no edema Neuro: AAO x 3  Psych: normal affect, no anxiety  Labs:   Results for orders placed or performed during the hospital encounter of 03/30/2016 (from the past 24 hour(s))  CBC with Differential     Status: Abnormal   Collection Time: 04/01/2016  8:38 PM  Result Value Ref Range   WBC 15.7 (H) 4.0 - 10.5 K/uL   RBC 3.47 (L) 4.22 - 5.81 MIL/uL   Hemoglobin 10.4 (L) 13.0 - 17.0 g/dL   HCT 33.0 (L) 39.0 - 52.0 %   MCV 95.1 78.0 - 100.0 fL   MCH 30.0 26.0 - 34.0 pg   MCHC 31.5 30.0 - 36.0 g/dL   RDW 13.5 11.5 - 15.5 %   Platelets 181 150 - 400 K/uL   Neutrophils Relative % 87 %   Neutro Abs 13.5 (H) 1.7 - 7.7 K/uL   Lymphocytes Relative 9 %   Lymphs Abs 1.4 0.7 - 4.0 K/uL   Monocytes Relative 4 %   Monocytes Absolute 0.7 0.1 - 1.0 K/uL   Eosinophils Relative 0 %   Eosinophils Absolute 0.0 0.0 - 0.7 K/uL   Basophils Relative 0 %   Basophils Absolute 0.1 0.0 - 0.1 K/uL  Comprehensive metabolic panel     Status: Abnormal   Collection Time: 03/15/2016  8:38 PM  Result Value Ref Range   Sodium 138 135 - 145 mmol/L   Potassium 4.6 3.5 - 5.1 mmol/L   Chloride 110 101 - 111 mmol/L   CO2 17 (L) 22 - 32 mmol/L   Glucose, Bld 176 (H) 65 - 99 mg/dL   BUN 54 (H) 6 - 20 mg/dL   Creatinine, Ser 3.41 (H) 0.61 - 1.24 mg/dL   Calcium 8.7 (L) 8.9 - 10.3 mg/dL   Total Protein 7.5 6.5 - 8.1 g/dL   Albumin 3.3 (L) 3.5 - 5.0 g/dL   AST 27 15 - 41 U/L   ALT 17 17 - 63 U/L   Alkaline Phosphatase 70 38 - 126 U/L   Total Bilirubin 1.0 0.3 - 1.2 mg/dL   GFR calc non Af Amer 17 (L) >60 mL/min   GFR calc Af Amer 20 (L) >60 mL/min   Anion gap 11 5 - 15  Brain natriuretic peptide     Status: Abnormal   Collection Time: 04/02/2016  8:38 PM  Result Value Ref Range   B Natriuretic Peptide 1241.1 (H) 0.0 - 100.0 pg/mL  I-Stat Troponin, ED - 0, 3, 6 hours (not at [facility])     Status: Abnormal    Collection Time: 03/11/2016  8:43 PM  Result Value Ref Range   Troponin i, poc 2.84 (HH) 0.00 - 0.08 ng/mL   Comment NOTIFIED PHYSICIAN    Comment 3          CBG monitoring, ED     Status: Abnormal   Collection Time: 03/26/2016  8:43 PM  Result Value Ref Range   Glucose-Capillary 177 (H) 65 - 99 mg/dL  I-Stat Troponin, ED - 0, 3, 6 hours (not at [facility])     Status: Abnormal   Collection Time: 03/19/2016 10:30 PM  Result Value Ref Range   Troponin i, poc 2.38 (HH) 0.00 - 0.08 ng/mL   Comment NOTIFIED PHYSICIAN    Comment 3          I-Stat CG4 Lactic Acid, ED     Status: Abnormal   Collection Time: 04/06/2016 10:33 PM  Result Value Ref Range   Lactic Acid, Venous 2.81 (HH) 0.5 - 2.0 mmol/L   Comment NOTIFIED PHYSICIAN      Radiology/Studies: Dg Chest 2 View  03/27/2016  CLINICAL DATA:  Acute onset of shortness of breath and vomiting. Dry cough. Initial encounter. EXAM: CHEST  2 VIEW COMPARISON:  Chest radiograph performed 09/28/2015 FINDINGS: The lungs  are well-aerated. Vascular congestion is noted. Mild bibasilar opacities may reflect mild interstitial edema. There is no evidence of pleural effusion or pneumothorax. The heart is borderline normal in size. No acute osseous abnormalities are seen. IMPRESSION: Vascular congestion noted. Mild bibasilar opacities may reflect mild interstitial edema. Electronically Signed   By: Garald Balding M.D.   On: 03/26/2016 20:21    EKG: today showed sinus tachycardia with ST depressions globally present suggestive of ischemia. There is anteroseptal Q waves present.   Echo: 09/29/2015 - Left ventricle: The cavity size was normal. Wall thickness was  normal. The estimated ejection fraction was 45%. Mid  anteroseptal, apical septal, apical inferior, apical anterior,  and true apex severe hypokinesis. Features are consistent with a  pseudonormal left ventricular filling pattern, with concomitant  abnormal relaxation and increased filling pressure (grade 2   diastolic dysfunction). E/medial e&' > 15 suggests LV end  diastolic pressure at least 20 mmHg. - Aortic valve: There was no stenosis. There was trivial  regurgitation. - Mitral valve: There was moderate regurgitation. Effective  regurgitant orifice (PISA): 0.28 cm^2. - Left atrium: The atrium was mildly dilated. - Right ventricle: The cavity size was normal. Systolic function  was normal. - Tricuspid valve: There was moderate regurgitation. Peak RV-RA  gradient (S): 48 mm Hg. - Pulmonary arteries: PA peak pressure: 51 mm Hg (S). - Inferior vena cava: The vessel was normal in size. The  respirophasic diameter changes were in the normal range (>= 50%),  consistent with normal central venous pressure.  Myocardial perfusion imaging:  07/17/2015 Intermediate risk stress nuclear study with a large, severe, partially reversible defect in the mid/distal anterior wall, distal inferior wall and apex; findings consistent with prior infarct and mild to moderate peri-infarct ischemia; EF 46 with apical hypokinesis and mild LVE.  Cardiac cath: none  Medical decision making:  Discussed care with the patient Discussed care with the ED physician on the phone Reviewed labs and imaging personally Reviewed prior records  ASSESSMENT AND PLAN:  This is a 65 y.o. male with known history of CAD by stress test, CKD stage 4, high risk of dialysis dependence with cardiac cath presented with possible NSTEMI and CHF with borderline hypotension. He also has leukocytosis and is being treated for presumed pneumonia/sepsis.     Active Problems:   DM2 (diabetes mellitus, type 2) ([facility])   HTN (hypertension)   CKD stage 4 due to type 2 diabetes mellitus ([facility])   Elevated troponin   Dyspnea   HLD (hyperlipidemia)   BPH (benign prostatic hyperplasia)   Sepsis ([place])   Type 2 diabetes mellitus with diabetic nephropathy ([facility])   Blindness, legal   Acute on chronic systolic heart failure, NYHA class 4 ([facility])    Leukocytosis   Wheezing  Acute on chronic systolic heart failure NYHA class IV, probably secondary to non compliance with fluids and worsening renal function, could be secondary to ischemia Pulmonary edema on CXR, elevated BNP, ordered TSH, ordered echocardiogram  Keep potassium >4, calcium >9, magnesium >2 - treating for NSTEMI, daily weights, strict I/os, fluid restriction, salt restriction - keeping metoprolol on board, holding off losartan due to borderline hypotension - if patient contineus to remain hypotensive, he will need inotropic support rather than fluids.   NSTEMI in the setting of CHF/possible pneumonia Cycle troponin, serial EKGs prn, lipid panel, TSH, HbA1c, echocardiogram in the AM Will treat medically, discussed with the patient. Risk of contrast induced nephropathy is 26%.  IV heparin, aspirin, lipitor 80  mg daily   Leukocytosis, with fevers/sepsis Blood cultures, urinalysis.  IV vancomycin + zosyn Checking lactic acid, will need to be followed in AM  Diabetes mellitus with peripheral arterial disease, nephropathy and retinopathy SSI, finger sticks No oral hypoglycemics HbA1c  CKD stage 4 w/volume overload If lasix did not work, will need nephrology to help with the volume removal   Dyspnea Echocardiogram in the morning, lasix IV   Blind precautions, legally blind due to DM since last 6 years    Signed, Flossie Dibble, MD MS 03/18/2016, 10:48 PM

## 2016-04-04 NOTE — ED Notes (Signed)
Pt to ED by EMS c/o sob since Friday. Pt received 1mg  Atrovent, 10mg  Albuterol, 125mg  solumedrol and 4mg  zofran. Initially wheezing noted to L upper and R lower. Pt reports worsening sob lying down, denies weight gain. EMS reports one episode of vomiting after walking to ambulance

## 2016-04-04 NOTE — ED Provider Notes (Addendum)
CSN: [id number]     Arrival date & time 03/17/2016  1915 History   First MD Initiated Contact with Patient 03/08/2016 1922     Chief Complaint  Patient presents with  . Shortness of Breath     (Consider location/radiation/quality/duration/timing/severity/associated sxs/prior Treatment) HPI 65 year old male with a history of hypertension, diabetes, hyperlipidemia, congestive heart failure with EF of 45%, blindness secondary to diabetic retinopathy, presents with concern of dyspnea developing over the last several days.  Dyspnea began on Friday, however he did not tell his family and symptoms worsened today. Patient reports that since Friday, he is also had chest pressure, which is worse when laying down, and also with deep breaths. Denies significant cough (occasional dry). No fevers/chills.  Significant fatigue and dyspnea on exertion as well as orthopnea.  Reports told to check daily weight showever has not over last 2 days. Symptoms similar to admission in November with pneumonia and CHF.   Past Medical History  Diagnosis Date  . 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  Marland Kitchen DM2 (diabetes mellitus, type 2) ([place])   . Hypertension   . Hyperlipidemia   . BPH (benign prostatic hyperplasia)   . Spontaneous pneumothorax     2  . Renal insufficiency   . Anemia   . Cancer ([place]) 11/08/2013    colon cancer (malignant polyp) removed  . CHF (congestive heart failure) ([place])   . Diabetic retinopathy ([place])     legally blind since 1995.   Past Surgical History  Procedure Laterality Date  . Polyp removed    . Eye surgery    . Cataract extraction     Family History  Problem Relation Age of Onset  . Cancer Father 74    prostate cancer  . Ulcers Mother   . Diabetes Sister   . Hypertension Sister   . Cancer Brother     lung cancer   Social History  Substance Use Topics  . Smoking status: Former Smoker -- 0.00 packs/day for 0 years    Types: Cigarettes  . Smokeless  tobacco: None     Comment: Quit early 90s  . Alcohol Use: No    Review of Systems  Constitutional: Positive for fatigue. Negative for fever and chills.  HENT: Negative for sore throat.   Eyes: Negative for visual disturbance.  Respiratory: Positive for shortness of breath. Negative for cough (occasional).   Cardiovascular: Positive for chest pain and leg swelling (unchanged).  Gastrointestinal: Positive for vomiting (1 episode). Negative for nausea and abdominal pain.  Genitourinary: Negative for dysuria and difficulty urinating.  Musculoskeletal: Negative for back pain and neck stiffness.  Skin: Negative for rash.  Neurological: Negative for syncope and headaches.      Allergies  Review of patient's allergies indicates no known allergies.  Home Medications   Prior to Admission medications   Medication Sig Start Date End Date Taking? Authorizing Provider  amLODipine (NORVASC) 10 MG tablet Take 10 mg by mouth daily.  09/26/15  Yes Historical Provider, MD  aspirin EC 81 MG tablet Take 81 mg by mouth daily.   Yes Historical Provider, MD  furosemide (LASIX) 40 MG tablet Take 1 tablet (40 mg total) by mouth 2 (two) times daily. 10/02/15  Yes Nita Sells, MD  glipiZIDE (GLUCOTROL XL) 10 MG 24 hr tablet Take 10 mg by mouth daily with breakfast.   Yes Historical Provider, MD  insulin glargine (LANTUS) 100 UNIT/ML injection Inject 9-15 Units into the skin at  bedtime. On sliding scale   Yes Historical Provider, MD  losartan (COZAAR) 100 MG tablet TAKE 1 TABLET ONE TIME DAILY 08/18/15  Yes Historical Provider, MD  metoprolol tartrate (LOPRESSOR) 25 MG tablet Take 25 mg by mouth daily.    Yes Historical Provider, MD  Multiple Vitamin (MULTIVITAMIN WITH MINERALS) TABS tablet Take 1 tablet by mouth daily.   Yes Historical Provider, MD  pravastatin (PRAVACHOL) 40 MG tablet Take 20 mg by mouth every evening.    Yes Historical Provider, MD  sitaGLIPtin (JANUVIA) 50 MG tablet Take 50 mg by  mouth daily.   Yes Historical Provider, MD  tamsulosin (FLOMAX) 0.4 MG CAPS capsule Take 0.8 mg by mouth daily after breakfast.    Yes Historical Provider, MD  iron polysaccharides (NU-IRON) 150 MG capsule TAKE 1 CAPSULE EVERY DAY 08/20/15   Historical Provider, MD   BP 91/62 mmHg  Pulse 96  Temp(Src) 100.4 F (38 C) (Rectal)  Resp 21  Wt 188 lb (85.276 kg)  SpO2 95% Physical Exam  Constitutional: He is oriented to person, place, and time. He appears well-developed and well-nourished. No distress.  HENT:  Head: Normocephalic and atraumatic.  Eyes: Conjunctivae and EOM are normal.  Neck: Normal range of motion. No JVD present.  Cardiovascular: Normal rate, regular rhythm, normal heart sounds and intact distal pulses.  Exam reveals no gallop and no friction rub.   No murmur heard. Pulmonary/Chest: Effort normal. No respiratory distress. He has decreased breath sounds (bilateral). He has no wheezes. He has no rales.  Abdominal: Soft. He exhibits no distension. There is no tenderness. There is no guarding.  Musculoskeletal: He exhibits edema (unchanged per family).  Neurological: He is alert and oriented to person, place, and time.  Skin: Skin is warm and dry. He is not diaphoretic.  Nursing note and vitals reviewed.   ED Course  Procedures (including critical care time) Labs Review Labs Reviewed  CBC WITH DIFFERENTIAL/PLATELET - Abnormal; Notable for the following:    WBC 15.7 (*)    RBC 3.47 (*)    Hemoglobin 10.4 (*)    HCT 33.0 (*)    Neutro Abs 13.5 (*)    All other components within normal limits  COMPREHENSIVE METABOLIC PANEL - Abnormal; Notable for the following:    CO2 17 (*)    Glucose, Bld 176 (*)    BUN 54 (*)    Creatinine, Ser 3.41 (*)    Calcium 8.7 (*)    Albumin 3.3 (*)    GFR calc non Af Amer 17 (*)    GFR calc Af Amer 20 (*)    All other components within normal limits  BRAIN NATRIURETIC PEPTIDE - Abnormal; Notable for the following:    B Natriuretic  Peptide 1241.1 (*)    All other components within normal limits  URINALYSIS, ROUTINE W REFLEX MICROSCOPIC (NOT AT [facility]) - Abnormal; Notable for the following:    Protein, ur 30 (*)    All other components within normal limits  URINE MICROSCOPIC-ADD ON - Abnormal; Notable for the following:    Squamous Epithelial / LPF 0-5 (*)    Bacteria, UA RARE (*)    Casts HYALINE CASTS (*)    All other components within normal limits  I-STAT TROPOININ, ED - Abnormal; Notable for the following:    Troponin i, poc 2.84 (*)    All other components within normal limits  CBG MONITORING, ED - Abnormal; Notable for the following:    Glucose-Capillary 177 (*)  All other components within normal limits  I-STAT CG4 LACTIC ACID, ED - Abnormal; Notable for the following:    Lactic Acid, Venous 2.81 (*)    All other components within normal limits  I-STAT TROPOININ, ED - Abnormal; Notable for the following:    Troponin i, poc 2.38 (*)    All other components within normal limits  I-STAT CG4 LACTIC ACID, ED - Abnormal; Notable for the following:    Lactic Acid, Venous 4.22 (*)    All other components within normal limits  I-STAT TROPOININ, ED - Abnormal; Notable for the following:    Troponin i, poc 2.14 (*)    All other components within normal limits  CULTURE, BLOOD (ROUTINE X 2)  CULTURE, BLOOD (ROUTINE X 2)  URINE CULTURE  HEPARIN LEVEL (UNFRACTIONATED)  TROPONIN I  TROPONIN I  TROPONIN I  TSH  COMPREHENSIVE METABOLIC PANEL  CBC  PROTIME-INR  APTT  LACTIC ACID, PLASMA  HEMOGLOBIN A1C  CBC  CBC WITH DIFFERENTIAL/PLATELET  COMPREHENSIVE METABOLIC PANEL  MAGNESIUM  PHOSPHORUS    Imaging Review Dg Chest 2 View  03/25/2016  CLINICAL DATA:  Acute onset of shortness of breath and vomiting. Dry cough. Initial encounter. EXAM: CHEST  2 VIEW COMPARISON:  Chest radiograph performed 09/28/2015 FINDINGS: The lungs are well-aerated. Vascular congestion is noted. Mild bibasilar opacities may reflect  mild interstitial edema. There is no evidence of pleural effusion or pneumothorax. The heart is borderline normal in size. No acute osseous abnormalities are seen. IMPRESSION: Vascular congestion noted. Mild bibasilar opacities may reflect mild interstitial edema. Electronically Signed   By: Garald Balding M.D.   On: 03/22/2016 20:21   I have personally reviewed and evaluated these images and lab results as part of my medical decision-making.   EKG Interpretation   Date/Time:  Sunday Apr 04 2016 19:42:42 EDT Ventricular Rate:  105 PR Interval:  145 QRS Duration: 88 QT Interval:  368 QTC Calculation: 486 R Axis:   41 Text Interpretation:  Sinus tachycardia Probable left atrial enlargement  Anterior infarct, old Repol abnrm, severe global ischemia (LM/MVD)  Elevation in aVR and diffuse depressions new since prior ECG Confirmed by  [place] ([id number]) on 03/24/2016 10:32:54 PM      CRITICAL CARE: SEVERE SEPSIS, hypotension, elevated troponin Performed by: Alvino Chapel   Total critical care time: 45 minutes  Critical care time was exclusive of separately billable procedures and treating other patients.  Critical care was necessary to treat or prevent imminent or life-threatening deterioration.  Critical care was time spent personally by me on the following activities: development of treatment plan with patient and/or surrogate as well as nursing, discussions with consultants, evaluation of patient's response to treatment, examination of patient, obtaining history from patient or surrogate, ordering and performing treatments and interventions, ordering and review of laboratory studies, ordering and review of radiographic studies, pulse oximetry and re-evaluation of patient's condition.  MDM   Final diagnoses:  Acute on chronic systolic heart failure, NYHA class 4 (HCC)  Leukocytosis  Sepsis, due to unspecified organism (HCC)  Lactic acidosis  Severe sepsis ([place])    65 year old male with a history of hypertension, diabetes, hyperlipidemia, congestive heart failure with EF of 45%, blindness secondary to diabetic retinopathy, presents with concern of dyspnea developing over the last several days.  Initial concern by clinical history, EKG, lab work and imaging was for acute congestive heart failure and possible NSTEMI. Cardiology was consulted, with initial plan for diuresis and admission.  Troponin  elevated to 2.8, and EKG concerning for elevation in aVR, and diffuse ST depressions new from prior. BNP elevated at 1200, and chest x-ray shows pulmonary edema. Initial plan was for cardiology admission. Pt received ASA and heparin ordered through cardiology.  After initial plan, pt had a leukocytosis on labs, and temperature is elevated to 100.4. Given new finding of fever, and patient's significant hypotension with systolic blood pressures in the 80s, and concern that dyspnea may be multifactorial with CHF and underlying pneumonia or other infection, initiated Code Sepsis with pt receiving 3L of saline per protocol.    Patient with initial lactic acid elevated, however thought to be secondary to CHF and possible cardiogenic etiology of hypotension-- and fluid resuscitation was delayed until patient found to be febrile.  Subsequent repeat lactic acid in setting of delayed fluid resuscitation was increased to 4.4.  Given concern for sepsis as etiology of hypotension in a patient with fever, leukocytosis, pt was given fluids despite known acute congestive heart failure per protocol.    Critical care was consulted for admission.  Cardiology now consulting rather than primary.     Gareth Morgan, MD 04/05/16 [phone]

## 2016-04-05 ENCOUNTER — Inpatient Hospital Stay (HOSPITAL_COMMUNITY): Payer: Medicare HMO

## 2016-04-05 DIAGNOSIS — J9601 Acute respiratory failure with hypoxia: Secondary | ICD-10-CM

## 2016-04-05 DIAGNOSIS — I5023 Acute on chronic systolic (congestive) heart failure: Secondary | ICD-10-CM

## 2016-04-05 DIAGNOSIS — I509 Heart failure, unspecified: Secondary | ICD-10-CM

## 2016-04-05 LAB — CBC WITH DIFFERENTIAL/PLATELET
BASOS PCT: 0 %
Basophils Absolute: 0 10*3/uL (ref 0.0–0.1)
EOS PCT: 0 %
Eosinophils Absolute: 0 10*3/uL (ref 0.0–0.7)
HEMATOCRIT: 31.5 % — AB (ref 39.0–52.0)
Hemoglobin: 9.6 g/dL — ABNORMAL LOW (ref 13.0–17.0)
Lymphocytes Relative: 5 %
Lymphs Abs: 0.7 10*3/uL (ref 0.7–4.0)
MCH: 29.7 pg (ref 26.0–34.0)
MCHC: 30.5 g/dL (ref 30.0–36.0)
MCV: 97.5 fL (ref 78.0–100.0)
MONO ABS: 0.3 10*3/uL (ref 0.1–1.0)
MONOS PCT: 2 %
NEUTROS ABS: 14.5 10*3/uL — AB (ref 1.7–7.7)
Neutrophils Relative %: 93 %
PLATELETS: 181 10*3/uL (ref 150–400)
RBC: 3.23 MIL/uL — ABNORMAL LOW (ref 4.22–5.81)
RDW: 13.7 % (ref 11.5–15.5)
WBC: 15.6 10*3/uL — ABNORMAL HIGH (ref 4.0–10.5)

## 2016-04-05 LAB — URINE MICROSCOPIC-ADD ON
RBC / HPF: NONE SEEN RBC/hpf (ref 0–5)
WBC UA: NONE SEEN WBC/hpf (ref 0–5)

## 2016-04-05 LAB — URINALYSIS, ROUTINE W REFLEX MICROSCOPIC
BILIRUBIN URINE: NEGATIVE
GLUCOSE, UA: NEGATIVE mg/dL
Hgb urine dipstick: NEGATIVE
KETONES UR: NEGATIVE mg/dL
Leukocytes, UA: NEGATIVE
NITRITE: NEGATIVE
PH: 5 (ref 5.0–8.0)
Protein, ur: 30 mg/dL — AB
SPECIFIC GRAVITY, URINE: 1.013 (ref 1.005–1.030)

## 2016-04-05 LAB — I-STAT CG4 LACTIC ACID, ED: LACTIC ACID, VENOUS: 4.22 mmol/L — AB (ref 0.5–2.0)

## 2016-04-05 LAB — POCT I-STAT 3, ART BLOOD GAS (G3+)
ACID-BASE DEFICIT: 13 mmol/L — AB (ref 0.0–2.0)
Bicarbonate: 13.3 mEq/L — ABNORMAL LOW (ref 20.0–24.0)
O2 Saturation: 93 %
PH ART: 7.237 — AB (ref 7.350–7.450)
TCO2: 14 mmol/L (ref 0–100)
pCO2 arterial: 31 mmHg — ABNORMAL LOW (ref 35.0–45.0)
pO2, Arterial: 73 mmHg — ABNORMAL LOW (ref 80.0–100.0)

## 2016-04-05 LAB — GLUCOSE, CAPILLARY
GLUCOSE-CAPILLARY: 320 mg/dL — AB (ref 65–99)
GLUCOSE-CAPILLARY: 335 mg/dL — AB (ref 65–99)
Glucose-Capillary: 367 mg/dL — ABNORMAL HIGH (ref 65–99)
Glucose-Capillary: 328 mg/dL — ABNORMAL HIGH (ref 65–99)
Glucose-Capillary: 303 mg/dL — ABNORMAL HIGH (ref 65–99)
Glucose-Capillary: 274 mg/dL — ABNORMAL HIGH (ref 65–99)

## 2016-04-05 LAB — I-STAT TROPONIN, ED: TROPONIN I, POC: 2.14 ng/mL — AB (ref 0.00–0.08)

## 2016-04-05 LAB — COMPREHENSIVE METABOLIC PANEL
ALBUMIN: 2.9 g/dL — AB (ref 3.5–5.0)
ALT: 32 U/L (ref 17–63)
ANION GAP: 10 (ref 5–15)
AST: 42 U/L — ABNORMAL HIGH (ref 15–41)
Alkaline Phosphatase: 61 U/L (ref 38–126)
BILIRUBIN TOTAL: 1.2 mg/dL (ref 0.3–1.2)
BUN: 58 mg/dL — ABNORMAL HIGH (ref 6–20)
CHLORIDE: 112 mmol/L — AB (ref 101–111)
CO2: 14 mmol/L — ABNORMAL LOW (ref 22–32)
Calcium: 7.8 mg/dL — ABNORMAL LOW (ref 8.9–10.3)
Creatinine, Ser: 4.02 mg/dL — ABNORMAL HIGH (ref 0.61–1.24)
GFR calc Af Amer: 16 mL/min — ABNORMAL LOW (ref >60)
GFR, EST NON AFRICAN AMERICAN: 14 mL/min — AB (ref >60)
Glucose, Bld: 359 mg/dL — ABNORMAL HIGH (ref 65–99)
POTASSIUM: 5.2 mmol/L — AB (ref 3.5–5.1)
Sodium: 136 mmol/L (ref 135–145)
TOTAL PROTEIN: 7.2 g/dL (ref 6.5–8.1)

## 2016-04-05 LAB — HEPARIN LEVEL (UNFRACTIONATED): Heparin Unfractionated: 0.75 IU/mL — ABNORMAL HIGH (ref 0.30–0.70)

## 2016-04-05 LAB — PROTIME-INR
INR: 1.81 — AB (ref 0.00–1.49)
PROTHROMBIN TIME: 21 s — AB (ref 11.6–15.2)

## 2016-04-05 LAB — APTT

## 2016-04-05 LAB — TROPONIN I
TROPONIN I: 6.41 ng/mL — AB (ref <0.031)
Troponin I: 1.33 ng/mL (ref <0.031)
Troponin I: 2 ng/mL (ref <0.031)

## 2016-04-05 LAB — PHOSPHORUS: Phosphorus: 3.7 mg/dL (ref 2.5–4.6)

## 2016-04-05 LAB — PROCALCITONIN: Procalcitonin: 3.96 ng/mL

## 2016-04-05 LAB — LACTIC ACID, PLASMA: LACTIC ACID, VENOUS: 4.2 mmol/L — AB (ref 0.5–2.0)

## 2016-04-05 LAB — TSH: TSH: 5.047 u[IU]/mL — AB (ref 0.350–4.500)

## 2016-04-05 LAB — MRSA PCR SCREENING: MRSA BY PCR: POSITIVE — AB

## 2016-04-05 LAB — MAGNESIUM: MAGNESIUM: 1.8 mg/dL (ref 1.7–2.4)

## 2016-04-05 MED ORDER — FENTANYL BOLUS VIA INFUSION
25.0000 ug | INTRAVENOUS | Status: DC | PRN
  Filled 2016-04-05: qty 25

## 2016-04-05 MED ORDER — HEPARIN (PORCINE) IN NACL 100-0.45 UNIT/ML-% IJ SOLN
950.0000 [IU]/h | INTRAMUSCULAR | Status: DC
  Administered 2016-04-05: 850 [IU]/h via INTRAVENOUS
  Administered 2016-04-06: 950 [IU]/h via INTRAVENOUS

## 2016-04-05 MED ORDER — SODIUM CHLORIDE 0.9 % IV SOLN
25.0000 ug/h | INTRAVENOUS | Status: DC
  Administered 2016-04-05: 50 ug/h via INTRAVENOUS
  Administered 2016-04-06: 100 ug/h via INTRAVENOUS
  Administered 2016-04-06: 400 ug/h via INTRAVENOUS
  Administered 2016-04-06: 50 ug/h via INTRAVENOUS
  Administered 2016-04-06 – 2016-04-07 (×2): 400 ug/h via INTRAVENOUS
  Administered 2016-04-07: 40 ug/h via INTRAVENOUS
  Administered 2016-04-08: 300 ug/h via INTRAVENOUS
  Administered 2016-04-09: 100 ug/h via INTRAVENOUS
  Filled 2016-04-05 (×8): qty 50

## 2016-04-05 MED ORDER — FENTANYL CITRATE (PF) 100 MCG/2ML IJ SOLN
INTRAMUSCULAR | Status: AC
  Administered 2016-04-05: 100 ug
  Filled 2016-04-05: qty 2

## 2016-04-05 MED ORDER — VANCOMYCIN HCL 10 G IV SOLR: 1250.0000 mg | INTRAVENOUS | Status: DC

## 2016-04-05 MED ORDER — FENTANYL CITRATE (PF) 100 MCG/2ML IJ SOLN
50.0000 ug | Freq: Once | INTRAMUSCULAR | Status: AC
  Administered 2016-04-05: 50 ug via INTRAVENOUS
  Filled 2016-04-05: qty 2

## 2016-04-05 MED ORDER — MIDAZOLAM HCL 2 MG/2ML IJ SOLN: 1.0000 mg | INTRAMUSCULAR | Status: DC | PRN

## 2016-04-05 MED ORDER — FUROSEMIDE 10 MG/ML IJ SOLN
80.0000 mg | Freq: Four times a day (QID) | INTRAMUSCULAR | Status: DC
  Administered 2016-04-05 – 2016-04-06 (×6): 80 mg via INTRAVENOUS
  Filled 2016-04-05 (×6): qty 8

## 2016-04-05 MED ORDER — SODIUM CHLORIDE 0.9% FLUSH
3.0000 mL | Freq: Two times a day (BID) | INTRAVENOUS | Status: DC
  Administered 2016-04-05 – 2016-04-12 (×16): 3 mL via INTRAVENOUS
  Administered 2016-04-13: 20 mL via INTRAVENOUS

## 2016-04-05 MED ORDER — HEPARIN BOLUS VIA INFUSION
4000.0000 [IU] | Freq: Once | INTRAVENOUS | Status: AC
  Administered 2016-04-05: 4000 [IU] via INTRAVENOUS
  Filled 2016-04-05: qty 4000

## 2016-04-05 MED ORDER — ANTISEPTIC ORAL RINSE SOLUTION (CORINZ)
7.0000 mL | OROMUCOSAL | Status: DC
  Administered 2016-04-05 – 2016-04-11 (×56): 7 mL via OROMUCOSAL

## 2016-04-05 MED ORDER — METOPROLOL TARTRATE 25 MG PO TABS
25.0000 mg | ORAL_TABLET | Freq: Two times a day (BID) | ORAL | Status: DC
  Administered 2016-04-05 (×2): 25 mg via ORAL
  Filled 2016-04-05 (×2): qty 1

## 2016-04-05 MED ORDER — CHLORHEXIDINE GLUCONATE 0.12% ORAL RINSE (MEDLINE KIT)
15.0000 mL | Freq: Two times a day (BID) | OROMUCOSAL | Status: DC
Start: 2016-04-05 — End: 2016-04-11
  Administered 2016-04-05 – 2016-04-11 (×12): 15 mL via OROMUCOSAL

## 2016-04-05 MED ORDER — FENTANYL CITRATE (PF) 100 MCG/2ML IJ SOLN
25.0000 ug | INTRAMUSCULAR | Status: DC | PRN
  Administered 2016-04-05 – 2016-04-13 (×4): 50 ug via INTRAVENOUS
  Filled 2016-04-05 (×3): qty 2

## 2016-04-05 MED ORDER — SODIUM CHLORIDE 0.9 % IV SOLN: 250.0000 mL | INTRAVENOUS | Status: DC | PRN

## 2016-04-05 MED ORDER — PANTOPRAZOLE SODIUM 40 MG IV SOLR
40.0000 mg | Freq: Every day | INTRAVENOUS | Status: DC
  Administered 2016-04-05 – 2016-04-06 (×2): 40 mg via INTRAVENOUS
  Filled 2016-04-05 (×3): qty 40

## 2016-04-05 MED ORDER — INSULIN ASPART 100 UNIT/ML ~~LOC~~ SOLN
0.0000 [IU] | SUBCUTANEOUS | Status: DC
  Administered 2016-04-05: 11 [IU] via SUBCUTANEOUS
  Administered 2016-04-05: 8 [IU] via SUBCUTANEOUS
  Administered 2016-04-06 (×2): 2 [IU] via SUBCUTANEOUS
  Administered 2016-04-06: 5 [IU] via SUBCUTANEOUS
  Administered 2016-04-07: 2 [IU] via SUBCUTANEOUS
  Administered 2016-04-07: 3 [IU] via SUBCUTANEOUS
  Administered 2016-04-07: 2 [IU] via SUBCUTANEOUS
  Administered 2016-04-07 (×3): 3 [IU] via SUBCUTANEOUS
  Administered 2016-04-08 (×5): 2 [IU] via SUBCUTANEOUS
  Administered 2016-04-09 (×2): 3 [IU] via SUBCUTANEOUS
  Administered 2016-04-09: 2 [IU] via SUBCUTANEOUS
  Administered 2016-04-09 (×3): 3 [IU] via SUBCUTANEOUS
  Administered 2016-04-10: 5 [IU] via SUBCUTANEOUS
  Administered 2016-04-10: 2 [IU] via SUBCUTANEOUS
  Administered 2016-04-10 (×2): 3 [IU] via SUBCUTANEOUS
  Administered 2016-04-11 – 2016-04-12 (×6): 2 [IU] via SUBCUTANEOUS
  Administered 2016-04-12: 3 [IU] via SUBCUTANEOUS
  Administered 2016-04-12: 2 [IU] via SUBCUTANEOUS
  Administered 2016-04-13: 3 [IU] via SUBCUTANEOUS
  Administered 2016-04-13: 5 [IU] via SUBCUTANEOUS
  Administered 2016-04-13 – 2016-04-14 (×4): 3 [IU] via SUBCUTANEOUS

## 2016-04-05 MED ORDER — HEPARIN (PORCINE) IN NACL 100-0.45 UNIT/ML-% IJ SOLN
1050.0000 [IU]/h | INTRAMUSCULAR | Status: DC
  Administered 2016-04-05: 1150 [IU]/h via INTRAVENOUS
  Administered 2016-04-05: 1050 [IU]/h via INTRAVENOUS
  Filled 2016-04-05 (×3): qty 250

## 2016-04-05 MED ORDER — ETOMIDATE 2 MG/ML IV SOLN
30.0000 mg | Freq: Once | INTRAVENOUS | Status: AC
Start: 2016-04-05 — End: 2016-04-05
  Administered 2016-04-05: 30 mg via INTRAVENOUS

## 2016-04-05 MED ORDER — PIPERACILLIN-TAZOBACTAM IN DEX 2-0.25 GM/50ML IV SOLN
2.2500 g | Freq: Four times a day (QID) | INTRAVENOUS | Status: DC
  Administered 2016-04-05 – 2016-04-08 (×12): 2.25 g via INTRAVENOUS
  Filled 2016-04-05 (×14): qty 50

## 2016-04-05 MED ORDER — PIPERACILLIN-TAZOBACTAM 3.375 G IVPB
3.3750 g | Freq: Three times a day (TID) | INTRAVENOUS | Status: DC
  Administered 2016-04-05: 3.375 g via INTRAVENOUS
  Filled 2016-04-05 (×3): qty 50

## 2016-04-05 MED ORDER — MUPIROCIN 2 % EX OINT
1.0000 | TOPICAL_OINTMENT | Freq: Two times a day (BID) | CUTANEOUS | Status: AC
Start: 2016-04-05 — End: 2016-04-09
  Administered 2016-04-05 – 2016-04-09 (×10): 1 via NASAL
  Filled 2016-04-05 (×4): qty 22

## 2016-04-05 MED ORDER — TAMSULOSIN HCL 0.4 MG PO CAPS
0.8000 mg | ORAL_CAPSULE | Freq: Every evening | ORAL | Status: DC
  Filled 2016-04-05: qty 2

## 2016-04-05 MED ORDER — ATORVASTATIN CALCIUM 80 MG PO TABS
80.0000 mg | ORAL_TABLET | Freq: Every day | ORAL | Status: DC
  Administered 2016-04-05 – 2016-04-10 (×6): 80 mg via ORAL
  Filled 2016-04-05 (×7): qty 1

## 2016-04-05 MED ORDER — PROPOFOL 1000 MG/100ML IV EMUL
5.0000 ug/kg/min | INTRAVENOUS | Status: DC
  Administered 2016-04-05: 5 ug/kg/min via INTRAVENOUS
  Filled 2016-04-05: qty 100

## 2016-04-05 MED ORDER — CETYLPYRIDINIUM CHLORIDE 0.05 % MT LIQD
7.0000 mL | Freq: Two times a day (BID) | OROMUCOSAL | Status: DC
  Administered 2016-04-05: 7 mL via OROMUCOSAL

## 2016-04-05 MED ORDER — CHLORHEXIDINE GLUCONATE CLOTH 2 % EX PADS
6.0000 | MEDICATED_PAD | Freq: Every day | CUTANEOUS | Status: AC
Start: 2016-04-05 — End: 2016-04-09
  Administered 2016-04-05 – 2016-04-09 (×5): 6 via TOPICAL

## 2016-04-05 MED ORDER — VANCOMYCIN HCL 10 G IV SOLR
1500.0000 mg | Freq: Once | INTRAVENOUS | Status: DC
  Filled 2016-04-05: qty 1500

## 2016-04-05 MED ORDER — INSULIN ASPART 100 UNIT/ML ~~LOC~~ SOLN: 0.0000 [IU] | Freq: Every day | SUBCUTANEOUS | Status: DC

## 2016-04-05 MED ORDER — IPRATROPIUM-ALBUTEROL 0.5-2.5 (3) MG/3ML IN SOLN
3.0000 mL | Freq: Four times a day (QID) | RESPIRATORY_TRACT | Status: DC | PRN
  Administered 2016-04-05 (×2): 3 mL via RESPIRATORY_TRACT
  Filled 2016-04-05 (×2): qty 3

## 2016-04-05 MED ORDER — INSULIN GLARGINE 100 UNIT/ML ~~LOC~~ SOLN
8.0000 [IU] | Freq: Every day | SUBCUTANEOUS | Status: DC
  Administered 2016-04-05 – 2016-04-10 (×6): 8 [IU] via SUBCUTANEOUS
  Filled 2016-04-05 (×8): qty 0.08

## 2016-04-05 MED ORDER — ASPIRIN EC 81 MG PO TBEC
81.0000 mg | DELAYED_RELEASE_TABLET | Freq: Every day | ORAL | Status: DC
  Administered 2016-04-05 – 2016-04-07 (×3): 81 mg via ORAL
  Filled 2016-04-05 (×3): qty 1

## 2016-04-05 MED ORDER — SODIUM POLYSTYRENE SULFONATE 15 GM/60ML PO SUSP
30.0000 g | Freq: Once | ORAL | Status: AC
  Administered 2016-04-05: 30 g via ORAL
  Filled 2016-04-05: qty 120

## 2016-04-05 MED ORDER — MIDAZOLAM HCL 2 MG/2ML IJ SOLN
1.0000 mg | INTRAMUSCULAR | Status: DC | PRN
  Administered 2016-04-05: 1 mg via INTRAVENOUS
  Administered 2016-04-05 (×3): 2 mg via INTRAVENOUS
  Filled 2016-04-05 (×4): qty 2

## 2016-04-05 MED ORDER — INSULIN ASPART 100 UNIT/ML ~~LOC~~ SOLN
0.0000 [IU] | Freq: Three times a day (TID) | SUBCUTANEOUS | Status: DC
  Administered 2016-04-05 (×2): 9 [IU] via SUBCUTANEOUS
  Administered 2016-04-05: 7 [IU] via SUBCUTANEOUS

## 2016-04-05 MED ORDER — MIDAZOLAM HCL 2 MG/2ML IJ SOLN
INTRAMUSCULAR | Status: AC
  Administered 2016-04-05: 2 mg
  Filled 2016-04-05: qty 2

## 2016-04-05 MED ORDER — ROCURONIUM BROMIDE 50 MG/5ML IV SOLN
60.0000 mg | Freq: Once | INTRAVENOUS | Status: AC
  Administered 2016-04-05: 60 mg via INTRAVENOUS

## 2016-04-05 NOTE — Progress Notes (Signed)
Pharmacy Antibiotic Note  Troy Young is a 65 y.o. male with SOB, fevers, and hypotension, possible sepsis/PNA.  H/O CKD stage 4. Pharmacy has been consulted for Vancomycin and Zosyn dosing.  Plan: Vancomycin 1500 mg IV now, then 1250 mg IV q48h Zosyn 3.375 g IV q8h   Weight: 188 lb (85.276 kg)  Temp (24hrs), Avg:100.1 F (37.8 C), Min:99.7 F (37.6 C), Max:100.4 F (38 C)   Recent Labs Lab 03/16/2016 2038 03/29/2016 2233 04/05/16 0103  WBC 15.7*  --   --   CREATININE 3.41*  --   --   LATICACIDVEN  --  2.81* 4.22*    Estimated Creatinine Clearance: 22.5 mL/min (by C-G formula based on Cr of 3.41).    No Known Allergies   Caryl Pina 04/05/2016 2:20 AM

## 2016-04-05 NOTE — Progress Notes (Signed)
eLink Physician-Brief Progress Note Patient Name: Troy Young DOB: Sep 11, 1951 MRN: [id number]   Date of Service  04/05/2016  HPI/Events of Note  Pt now intubated  eICU Interventions  Change AC, HS coverage to q4 SSI     Intervention Category Intermediate Interventions: Other:  Jontue Crumpacker 04/05/2016, 8:22 PM

## 2016-04-05 NOTE — Progress Notes (Signed)
ANTICOAGULATION CONSULT NOTE - Initial Consult  Pharmacy Consult for Heparin Indication: chest pain/ACS  No Known Allergies  Patient Measurements: Weight: 188 lb (85.276 kg) Heparin Dosing Weight: 85 kg  Vital Signs: Temp: 100.4 F (38 C) (05/28 2245) Temp Source: Rectal (05/28 2245) BP: 72/55 mmHg (05/29 0130) Pulse Rate: 96 (05/29 0130)  Labs:  Recent Labs  03/19/2016 2038  HGB 10.4*  HCT 33.0*  PLT 181  CREATININE 3.41*    Estimated Creatinine Clearance: 22.5 mL/min (by C-G formula based on Cr of 3.41).   Medical History: Past Medical History  Diagnosis Date  . 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  Marland Kitchen DM2 (diabetes mellitus, type 2) ([place])   . Hypertension   . Hyperlipidemia   . BPH (benign prostatic hyperplasia)   . Spontaneous pneumothorax     2  . Renal insufficiency   . Anemia   . Cancer ([place]) 11/08/2013    colon cancer (malignant polyp) removed  . CHF (congestive heart failure) ([place])   . Diabetic retinopathy ([place])     legally blind since 1995.    Medications:   (Not in a hospital admission) Scheduled:   Infusions:    Assessment: 65yo male with history of HTN, HLD, CHF and DM2 presents with SOB. Pharmacy is consulted to dose heparin for ACS/chest pain. Trop 2.38.  Goal of Therapy:  Heparin level 0.3-0.7 units/ml Monitor platelets by anticoagulation protocol: Yes   Plan:  Give 4000 units bolus x 1 Start heparin infusion at 1150 units/hr Check anti-Xa level in 8 hours and daily while on heparin Continue to monitor H&H and platelets  Andrey Cota. Diona Foley, PharmD, BCPS Clinical Pharmacist Pager [phone] 04/05/2016,1:33 AM

## 2016-04-05 NOTE — Progress Notes (Signed)
eLink Physician-Brief Progress Note Patient Name: Troy Young DOB: 1951/03/06 MRN: [id number]   Date of Service  04/05/2016  HPI/Events of Note  Vent dyssynchrony, high peak pressures  eICU Interventions  Add propofol     Intervention Category Evaluation Type: Other  Troy Young 04/05/2016, 10:59 PM

## 2016-04-05 NOTE — Progress Notes (Signed)
eLink Physician-Brief Progress Note Patient Name: Troy Young DOB: 1951/09/26 MRN: [id number]   Date of Service  04/05/2016  HPI/Events of Note  CXR shows b/l lower lobe opacities, ABG reviewed.   eICU Interventions  Continue vanco, zosyn Increase PEEP to 14, start ARDSnet protocol Fentanyl gtt and versed PRN for sedation     Intervention Category Evaluation Type: Other  [name] 04/05/2016, 9:22 PM

## 2016-04-05 NOTE — ED Notes (Signed)
CCM at bedside 

## 2016-04-05 NOTE — Progress Notes (Signed)
Patient intubated by MD placed on above vent settings per Dr. Milinda Hirschfeld. Good color change on ETCO2 detector, BBS equal and bilateral, SATS 97%.

## 2016-04-05 NOTE — H&P (Addendum)
PULMONARY / CRITICAL CARE MEDICINE   Name: Troy Young MRN: [id number] DOB: 12/09/1950    ADMISSION DATE:  03/13/2016  CHIEF COMPLAINT:  Difficulty Breathing  HISTORY OF PRESENT ILLNESS:   Troy Young is an 65 y.o. 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 He states that his symptoms began on Friday after he had food from [employer]. He has also complained of generalized edema, and is up about 15 lbs. In the ED, he was found to have a low-grade fever, and was treated for presumed sepsis with multiple liters of IVF, and his lactic acid rose from mid-2s to mid-4s. He denies subjective fevers, chills, etc.  PAST MEDICAL HISTORY :  He  has a past medical history of Legally blind; DM2 (diabetes mellitus, type 2) ([place]); Hypertension; Hyperlipidemia; BPH (benign prostatic hyperplasia); Spontaneous pneumothorax; Renal insufficiency; Anemia; Cancer ([place]) (11/08/2013); CHF (congestive heart failure) ([place]); and Diabetic retinopathy ([place]).  PAST SURGICAL HISTORY: He  has past surgical history that includes polyp removed; Eye surgery; and Cataract extraction.  No Known Allergies  No current facility-administered medications on file prior to encounter.   Current Outpatient Prescriptions on File Prior to Encounter  Medication Sig  . amLODipine (NORVASC) 10 MG tablet Take 10 mg by mouth daily.   Marland Kitchen aspirin EC 81 MG tablet Take 81 mg by mouth daily.  . furosemide (LASIX) 40 MG tablet Take 1 tablet (40 mg total) by mouth 2 (two) times daily.  Marland Kitchen glipiZIDE (GLUCOTROL XL) 10 MG 24 hr tablet Take 10 mg by mouth daily with breakfast.  . insulin glargine (LANTUS) 100 UNIT/ML injection Inject 9-15 Units into the skin at bedtime. On sliding scale  . losartan (COZAAR) 100 MG tablet TAKE 1 TABLET ONE TIME DAILY  . metoprolol tartrate (LOPRESSOR) 25 MG tablet Take 25 mg by  mouth daily.   . pravastatin (PRAVACHOL) 40 MG tablet Take 20 mg by mouth every evening.   . sitaGLIPtin (JANUVIA) 50 MG tablet Take 50 mg by mouth daily.  . tamsulosin (FLOMAX) 0.4 MG CAPS capsule Take 0.8 mg by mouth daily after breakfast.   . iron polysaccharides (NU-IRON) 150 MG capsule TAKE 1 CAPSULE EVERY DAY    FAMILY HISTORY:  His indicated that his mother is deceased. He indicated that his father is deceased. He indicated that his sister is alive. He indicated that his brother is deceased.   SOCIAL HISTORY: He  reports that he has quit smoking. His smoking use included Cigarettes. He smoked 0.00 packs per day for 0 years. He does not have any smokeless tobacco history on file. He reports that he does not drink alcohol.  REVIEW OF SYSTEMS:   Per HPI   VITAL SIGNS: BP 87/63 mmHg  Pulse 98  Temp(Src) 100.4 F (38 C) (Rectal)  Resp 29  Wt 188 lb (85.276 kg)  SpO2 96%  HEMODYNAMICS:    VENTILATOR SETTINGS:    INTAKE / OUTPUT:    PHYSICAL EXAMINATION: General:  Elderly man in NAD Neuro:  Awake, alert, but blind. HEENT:  MMM Cardiovascular:  Heart sounds dual and normal Lungs:  Bibasilar crackles Abdomen:  Firm, but nontender Musculoskeletal:  General edema Skin:  No visible rashes  LABS:  BMET  Recent Labs Lab 03/14/2016 2038  NA 138  K 4.6  CL 110  CO2 17*  BUN 54*  CREATININE 3.41*  GLUCOSE 176*  Electrolytes  Recent Labs Lab 03/08/2016 2038  CALCIUM 8.7*    CBC  Recent Labs Lab 03/28/2016 2038  WBC 15.7*  HGB 10.4*  HCT 33.0*  PLT 181    Coag's No results for input(s): APTT, INR in the last 168 hours.  Sepsis Markers  Recent Labs Lab 03/28/2016 2233 04/05/16 0103  LATICACIDVEN 2.81* 4.22*    ABG No results for input(s): PHART, PCO2ART, PO2ART in the last 168 hours.  Liver Enzymes  Recent Labs Lab 04/03/2016 2038  AST 27  ALT 17  ALKPHOS 70  BILITOT 1.0  ALBUMIN 3.3*    Cardiac Enzymes No results for input(s):  TROPONINI, PROBNP in the last 168 hours.  Glucose  Recent Labs Lab 03/11/2016 2043  GLUCAP 177*    Imaging Dg Chest 2 View  04/06/2016  CLINICAL DATA:  Acute onset of shortness of breath and vomiting. Dry cough. Initial encounter. EXAM: CHEST  2 VIEW COMPARISON:  Chest radiograph performed 09/28/2015 FINDINGS: The lungs are well-aerated. Vascular congestion is noted. Mild bibasilar opacities may reflect mild interstitial edema. There is no evidence of pleural effusion or pneumothorax. The heart is borderline normal in size. No acute osseous abnormalities are seen. IMPRESSION: Vascular congestion noted. Mild bibasilar opacities may reflect mild interstitial edema. Electronically Signed   By: Garald Balding M.D.   On: 04/03/2016 20:21     STUDIES:  CXR -> Increased interstitial markings and pulmonary edema  CULTURES: Blood 5/29 (pending) Urine 5/29 (pending)  ANTIBIOTICS: Ceftriaxone x1 dose 5/29 >> Vanc 5/29 >> Pip-tazo 5/29 >>  SIGNIFICANT EVENTS: Bolused 3L in ED  LINES/TUBES: PIV  DISCUSSION: 65 y/o man with Acute exacerbation of heart failure.  ASSESSMENT / PLAN:  PULMONARY A: Hypoxia due to Pulmonary Edema P:   Will provide Supplemental O2 Tx underlying heart failure  CARDIOVASCULAR A:  Acute exacerbation of ischemic systolic cardiomyopathy Possible NSTEMI HTN P:  Tx with aggressive diuresis, lasix 80 mg IV q6 hrs. Heparin infusion Cards consult  RENAL A:   Acute on chronic kidney injury P:   No real change in Cr at around 3.5, but BUN was significantly higher than baseline. Due to cardiorenal syndrome, so will treat underlying cause.  GASTROINTESTINAL A:   No active issues P:    HEMATOLOGIC A:   Will anticoag w/ heparin infusion for NSTEMI P:  INFECTIOUS A:   Possible infectious process P:   Leukocytosis and fever concerning for infection, but CXR unconvincing, and U/A is normal. Empiric tx with vanc zosyn for now.  ENDOCRINE A:    DM2 P:   Will treat with SSI + basal (8 U nightly of lantus)  NEUROLOGIC A:   Blind P:   No acute needs   FAMILY  - Updates: updated on admission  - Inter-disciplinary family meet or Palliative Care meeting due by:  June 5  CRITICAL CARE Performed by: Luz Brazen   Total critical care time: 60 minutes  Critical care time was exclusive of separately billable procedures and treating other patients.  Critical care was necessary to treat or prevent imminent or life-threatening deterioration.  Critical care was time spent personally by me on the following activities: development of treatment plan with patient and/or surrogate as well as nursing, discussions with consultants, evaluation of patient's response to treatment, examination of patient, obtaining history from patient or surrogate, ordering and performing treatments and interventions, ordering and review of laboratory studies, ordering and review of radiographic studies, pulse oximetry and re-evaluation of patient's condition.  Marjory Lies  [facility] [place] Pager: [phone]  04/05/2016, 2:23 AM

## 2016-04-05 NOTE — ED Notes (Signed)
Pt c/o worsening shortness of breath. Diminished throughout with wheezing in lower lobes; Dr.Schlossman made aware

## 2016-04-05 NOTE — Progress Notes (Signed)
Placed patient on BiPAP due to increased WOB

## 2016-04-05 NOTE — Consult Note (Addendum)
CARDIOLOGY INPATIENT CONSULTATION NOTE  Patient ID: Troy Young MRN: [id number], DOB/AGE: Sep 16, 1951   Admit date: 03/14/2016  Consulting physician: Luz Brazen MD Primary Physician: [name], MD Primary Cardiologist: Minus Breeding MD  Reason for consultation: elevated troponin  HPI: This is a 65 y.o.African American male with prior history of ischemic cardiomyopathy (LVEF 45%) by stress test, HTN, IDDM2, diabetic nephropathy stage 4 (GFR 23), diabetic neuropathy who presented with SOB for the last 3 days.  Patient said that he started having SOB on Friday. This continued to get worse. It was associated with wheezing, poor exercise tolerance, but not with chest pain. Patient was previously admitted in 09/2015 at which time he weighed 184 lbs up from 171 lbs. His troponins were elevated to 0.79 w/ Cr 3.2 and BNP 1281. He did not have chest pain at that time either. He weighs 188 lbs. His dry weight according to him is 172-175 lbs. He has been compliant with his medications. Last time he checked his weight was a week ago and he was fine back then. He has been having PND, orthopnea for the last two days. He vomited while come to the hospital.  Patient denied any cough, fevers, chills.   He underwent MPI on 07/17/2015 which showed intermediate risk stress nuclear study with a large, severe, partially reversible defect in the mid/distal anterior wall, distal inferior wall and apex; findings consistent with prior infarct and mild to moderate peri-infarct ischemia; EF 46 with apical hypokinesis and mild LVE.   Problem List: Past Medical History  Diagnosis Date  . 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  Marland Kitchen DM2 (diabetes mellitus, type 2) ([place])   . Hypertension   . Hyperlipidemia   . BPH (benign prostatic hyperplasia)   . Spontaneous pneumothorax     2  . Renal insufficiency   . Anemia   . Cancer ([place]) 11/08/2013    colon cancer (malignant polyp)  removed  . CHF (congestive heart failure) ([place])   . Diabetic retinopathy ([place])     legally blind since 1995.    Past Surgical History  Procedure Laterality Date  . Polyp removed    . Eye surgery    . Cataract extraction       Allergies: No Known Allergies   Home Medications Current Facility-Administered Medications  Medication Dose Route Frequency Provider Last Rate Last Dose  . azithromycin (ZITHROMAX) 500 mg in dextrose 5 % 250 mL IVPB  500 mg Intravenous Once Gareth Morgan, MD      . cefTRIAXone (ROCEPHIN) 1 g in dextrose 5 % 50 mL IVPB  1 g Intravenous Once Gareth Morgan, MD      . nitroGLYCERIN (NITROSTAT) SL tablet 0.4 mg  0.4 mg Sublingual Q5 min PRN Gareth Morgan, MD       Current Outpatient Prescriptions  Medication Sig Dispense Refill  . amLODipine (NORVASC) 10 MG tablet Take 10 mg by mouth daily.     Marland Kitchen aspirin EC 81 MG tablet Take 81 mg by mouth daily.    . furosemide (LASIX) 40 MG tablet Take 1 tablet (40 mg total) by mouth 2 (two) times daily. 60 tablet 1  . glipiZIDE (GLUCOTROL XL) 10 MG 24 hr tablet Take 10 mg by mouth daily with breakfast.    . insulin glargine (LANTUS) 100 UNIT/ML injection Inject 9-15 Units into the skin at bedtime. On sliding scale    . losartan (COZAAR) 100 MG tablet TAKE 1 TABLET ONE TIME  DAILY    . metoprolol tartrate (LOPRESSOR) 25 MG tablet Take 25 mg by mouth daily.     . Multiple Vitamin (MULTIVITAMIN WITH MINERALS) TABS tablet Take 1 tablet by mouth daily.    . pravastatin (PRAVACHOL) 40 MG tablet Take 20 mg by mouth every evening.     . sitaGLIPtin (JANUVIA) 50 MG tablet Take 50 mg by mouth daily.    . tamsulosin (FLOMAX) 0.4 MG CAPS capsule Take 0.8 mg by mouth daily after breakfast.     . iron polysaccharides (NU-IRON) 150 MG capsule TAKE 1 CAPSULE EVERY DAY       Family History  Problem Relation Age of Onset  . Cancer Father 46    prostate cancer  . Ulcers Mother   . Diabetes Sister   . Hypertension Sister   . Cancer  Brother     lung cancer     Social History   Social History  . Marital Status: Divorced    Spouse Name: N/A  . Number of Children: 2  . Years of Education: N/A   Occupational History  . Not on file.   Social History Main Topics  . Smoking status: Former Smoker -- 0.00 packs/day for 0 years    Types: Cigarettes  . Smokeless tobacco: Not on file     Comment: Quit early 78s  . Alcohol Use: No  . Drug Use: Not on file  . Sexual Activity: Not Currently   Other Topics Concern  . Not on file   Social History Narrative   Marital status: single      Children: 2 children; 2 grandchildren      Lives with sister and niece.        Employment:  Works at [employer] five days per week; 40 hours/week.      Tobacco; none; quit 20 years ago      Alcohol: none; quit drinking 20 years ago      Drugs: previous drugs 20 years ago cocaine, marijiuana; no iv drug use.       Exercise: sporadic      Independent:  Bathes self and dresses self; no cleaning of room at all; must have assistance; no cooking; no driving.  Has a cane.      Advanced Directives: no living will.  Sister is HCPOA; FULL CODE;      Review of Systems: General: has weight gain of 14 lbs. Denied fever  Cardiovascular: chest pain, dyspnea, PND, orthopnea, dyspnea on exertion negative for edema palpitations Dermatological: negative for rash Respiratory: negative for cough or wheezing Urologic: negative for hematuria Abdominal: nausea and vomiting negative for diarrhea, bright red blood per rectum, melena, or hematemesis Neurologic: negative for visual changes, syncope, or dizziness Endocrine: no diabetes, no hypothyroidism Immunological: no lymph adenopathy Psych: non homicidal/suicidal  Physical Exam: Vitals: BP 101/59 mmHg  Pulse 95  Temp(Src) 100.4 F (38 C) (Rectal)  Resp 31  Wt 85.276 kg (188 lb)  SpO2 95% General: not in acute distress Neck: JVP elevated to the jaw Heart: tachycardiac and regular  rhythm, S1, S2, no murmurs  Lungs: mild crackles on the bases and poor air entry  GI: non tender, non distended, bowel sounds present Extremities: no edema Neuro: AAO x 3  Psych: normal affect, no anxiety  Labs:   Results for orders placed or performed during the hospital encounter of 03/18/2016 (from the past 24 hour(s))  CBC with Differential     Status: Abnormal   Collection  Time: 03/13/2016  8:38 PM  Result Value Ref Range   WBC 15.7 (H) 4.0 - 10.5 K/uL   RBC 3.47 (L) 4.22 - 5.81 MIL/uL   Hemoglobin 10.4 (L) 13.0 - 17.0 g/dL   HCT 33.0 (L) 39.0 - 52.0 %   MCV 95.1 78.0 - 100.0 fL   MCH 30.0 26.0 - 34.0 pg   MCHC 31.5 30.0 - 36.0 g/dL   RDW 13.5 11.5 - 15.5 %   Platelets 181 150 - 400 K/uL   Neutrophils Relative % 87 %   Neutro Abs 13.5 (H) 1.7 - 7.7 K/uL   Lymphocytes Relative 9 %   Lymphs Abs 1.4 0.7 - 4.0 K/uL   Monocytes Relative 4 %   Monocytes Absolute 0.7 0.1 - 1.0 K/uL   Eosinophils Relative 0 %   Eosinophils Absolute 0.0 0.0 - 0.7 K/uL   Basophils Relative 0 %   Basophils Absolute 0.1 0.0 - 0.1 K/uL  Comprehensive metabolic panel     Status: Abnormal   Collection Time: 03/26/2016  8:38 PM  Result Value Ref Range   Sodium 138 135 - 145 mmol/L   Potassium 4.6 3.5 - 5.1 mmol/L   Chloride 110 101 - 111 mmol/L   CO2 17 (L) 22 - 32 mmol/L   Glucose, Bld 176 (H) 65 - 99 mg/dL   BUN 54 (H) 6 - 20 mg/dL   Creatinine, Ser 3.41 (H) 0.61 - 1.24 mg/dL   Calcium 8.7 (L) 8.9 - 10.3 mg/dL   Total Protein 7.5 6.5 - 8.1 g/dL   Albumin 3.3 (L) 3.5 - 5.0 g/dL   AST 27 15 - 41 U/L   ALT 17 17 - 63 U/L   Alkaline Phosphatase 70 38 - 126 U/L   Total Bilirubin 1.0 0.3 - 1.2 mg/dL   GFR calc non Af Amer 17 (L) >60 mL/min   GFR calc Af Amer 20 (L) >60 mL/min   Anion gap 11 5 - 15  Brain natriuretic peptide     Status: Abnormal   Collection Time: 03/30/2016  8:38 PM  Result Value Ref Range   B Natriuretic Peptide 1241.1 (H) 0.0 - 100.0 pg/mL  I-Stat Troponin, ED - 0, 3, 6 hours (not  at [facility])     Status: Abnormal   Collection Time: 03/09/2016  8:43 PM  Result Value Ref Range   Troponin i, poc 2.84 (HH) 0.00 - 0.08 ng/mL   Comment NOTIFIED PHYSICIAN    Comment 3          CBG monitoring, ED     Status: Abnormal   Collection Time: 03/29/2016  8:43 PM  Result Value Ref Range   Glucose-Capillary 177 (H) 65 - 99 mg/dL  I-Stat Troponin, ED - 0, 3, 6 hours (not at [facility])     Status: Abnormal   Collection Time: 03/23/2016 10:30 PM  Result Value Ref Range   Troponin i, poc 2.38 (HH) 0.00 - 0.08 ng/mL   Comment NOTIFIED PHYSICIAN    Comment 3          I-Stat CG4 Lactic Acid, ED     Status: Abnormal   Collection Time: 03/15/2016 10:33 PM  Result Value Ref Range   Lactic Acid, Venous 2.81 (HH) 0.5 - 2.0 mmol/L   Comment NOTIFIED PHYSICIAN      Radiology/Studies: Dg Chest 2 View  04/03/2016  CLINICAL DATA:  Acute onset of shortness of breath and vomiting. Dry cough. Initial encounter. EXAM: CHEST  2 VIEW COMPARISON:  Chest  radiograph performed 09/28/2015 FINDINGS: The lungs are well-aerated. Vascular congestion is noted. Mild bibasilar opacities may reflect mild interstitial edema. There is no evidence of pleural effusion or pneumothorax. The heart is borderline normal in size. No acute osseous abnormalities are seen. IMPRESSION: Vascular congestion noted. Mild bibasilar opacities may reflect mild interstitial edema. Electronically Signed   By: Garald Balding M.D.   On: 03/13/2016 20:21    EKG: today showed sinus tachycardia with ST depressions globally present suggestive of ischemia. There is anteroseptal Q waves present.   Echo: 09/29/2015 - Left ventricle: The cavity size was normal. Wall thickness was  normal. The estimated ejection fraction was 45%. Mid  anteroseptal, apical septal, apical inferior, apical anterior,  and true apex severe hypokinesis. Features are consistent with a  pseudonormal left ventricular filling pattern, with concomitant  abnormal relaxation and  increased filling pressure (grade 2  diastolic dysfunction). E/medial e&' > 15 suggests LV end  diastolic pressure at least 20 mmHg. - Aortic valve: There was no stenosis. There was trivial  regurgitation. - Mitral valve: There was moderate regurgitation. Effective  regurgitant orifice (PISA): 0.28 cm^2. - Left atrium: The atrium was mildly dilated. - Right ventricle: The cavity size was normal. Systolic function  was normal. - Tricuspid valve: There was moderate regurgitation. Peak RV-RA  gradient (S): 48 mm Hg. - Pulmonary arteries: PA peak pressure: 51 mm Hg (S). - Inferior vena cava: The vessel was normal in size. The  respirophasic diameter changes were in the normal range (>= 50%),  consistent with normal central venous pressure.  Myocardial perfusion imaging:  07/17/2015 Intermediate risk stress nuclear study with a large, severe, partially reversible defect in the mid/distal anterior wall, distal inferior wall and apex; findings consistent with prior infarct and mild to moderate peri-infarct ischemia; EF 46 with apical hypokinesis and mild LVE.  Cardiac cath: none  Medical decision making:  Discussed care with the patient Discussed care with the ED physician on the phone Reviewed labs and imaging personally Reviewed prior records  ASSESSMENT AND PLAN:  This is a 65 y.o. male with known history of CAD by stress test, CKD stage 4, high risk of dialysis dependence with cardiac cath presented with possible NSTEMI and CHF with borderline hypotension. He also has leukocytosis and is being treated for presumed pneumonia/sepsis.     Active Problems:   DM2 (diabetes mellitus, type 2) (HCC)   HTN (hypertension)   CKD stage 4 due to type 2 diabetes mellitus (HCC)   Elevated troponin   Dyspnea   HLD (hyperlipidemia)   BPH (benign prostatic hyperplasia)   Sepsis ([place])   Type 2 diabetes mellitus with diabetic nephropathy (HCC)   Blindness, legal   Acute on chronic systolic  heart failure, NYHA class 4 (HCC)   Leukocytosis   Wheezing  Acute on chronic systolic heart failure NYHA class IV, probably secondary to non compliance with fluids and worsening renal function, could be secondary to ischemia Pulmonary edema on CXR, elevated BNP, ordered TSH, ordered echocardiogram  Keep potassium >4, calcium >9, magnesium >2 - treating for NSTEMI, daily weights, strict I/os, fluid restriction, salt restriction - keeping metoprolol on board, holding off losartan due to borderline hypotension - if patient contineus to remain hypotensive, he will need inotropic support rather than fluids.   NSTEMI in the setting of CHF/possible pneumonia Cycle troponin, serial EKGs prn, lipid panel, TSH, HbA1c, echocardiogram in the AM Will treat medically, discussed with the patient. Risk of contrast induced nephropathy is 26%.  IV heparin, aspirin, lipitor 80 mg daily   Leukocytosis, with fevers/sepsis Blood cultures, urinalysis.  IV vancomycin + zosyn Checking lactic acid, will need to be followed in AM  Diabetes mellitus with peripheral arterial disease, nephropathy and retinopathy SSI, finger sticks No oral hypoglycemics HbA1c  CKD stage 4 w/volume overload If lasix did not work, will need nephrology to help with the volume removal   Dyspnea Echocardiogram in the morning, lasix IV   Blind precautions, legally blind due to DM since last 6 years    Signed, Flossie Dibble, MD MS 03/29/2016, 10:48 PM   Patient seen in consultation earlier this morning.  Chart reviewed.  I agree with the plans as discussed.  Medical management.  Check echocardiogram.

## 2016-04-05 NOTE — Progress Notes (Signed)
ANTICOAGULATION CONSULT NOTE - Initial Consult  Pharmacy Consult for Heparin/Vancomycin/Zosyn Indication: chest pain/ACS/sepsis  Allergies  Allergen Reactions  . Pork-Derived Products Other (See Comments)    Pt does not eat pork or pork products    Patient Measurements: Height: 5\' 8"  (172.7 cm) Weight: 196 lb 3.4 oz (89 kg) IBW/kg (Calculated) : 68.4 Heparin Dosing Weight: 86 kg  Vital Signs: Temp: 98 F (36.7 C) (05/29 0734) Temp Source: Oral (05/29 0734) BP: 116/70 mmHg (05/29 1000) Pulse Rate: 100 (05/29 1006)  Labs:  Recent Labs  03/17/2016 2038 04/05/16 0440 04/05/16 0809 04/05/16 0957  HGB 10.4* 9.6*  --   --   HCT 33.0* 31.5*  --   --   PLT 181 181  --   --   APTT  --  >200*  --   --   LABPROT  --  21.0*  --   --   INR  --  1.81*  --   --   HEPARINUNFRC  --   --   --  0.75*  CREATININE 3.41* 4.02*  --   --   TROPONINI  --  1.33* 2.00*  --     Estimated Creatinine Clearance: 19.6 mL/min (by C-G formula based on Cr of 4.02).   Medical History: Past Medical History  Diagnosis Date  . 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  Marland Kitchen DM2 (diabetes mellitus, type 2) ([place])   . Hypertension   . Hyperlipidemia   . BPH (benign prostatic hyperplasia)   . Spontaneous pneumothorax     2  . Renal insufficiency   . Anemia   . Cancer ([place]) 11/08/2013    colon cancer (malignant polyp) removed  . CHF (congestive heart failure) ([place])   . Diabetic retinopathy ([place])     legally blind since 1995.    Medications:  Prescriptions prior to admission  Medication Sig Dispense Refill Last Dose  . amLODipine (NORVASC) 10 MG tablet Take 10 mg by mouth daily.    03/14/2016 at Unknown time  . aspirin EC 81 MG tablet Take 81 mg by mouth daily.   04/03/2016 at Unknown time  . furosemide (LASIX) 40 MG tablet Take 1 tablet (40 mg total) by mouth 2 (two) times daily. 60 tablet 1 03/09/2016 at 1400  . glipiZIDE (GLUCOTROL XL) 10 MG 24 hr tablet Take 10 mg by mouth  daily with breakfast.   03/15/2016 at Unknown time  . insulin glargine (LANTUS) 100 UNIT/ML injection Inject 9-15 Units into the skin at bedtime. On sliding scale   04/03/2016 at Unknown time  . losartan (COZAAR) 100 MG tablet TAKE 1 TABLET ONE TIME DAILY   04/01/2016 at Unknown time  . metoprolol tartrate (LOPRESSOR) 25 MG tablet Take 25 mg by mouth daily.    04/02/2016 at 1400  . Multiple Vitamin (MULTIVITAMIN WITH MINERALS) TABS tablet Take 1 tablet by mouth daily.   03/09/2016 at Unknown time  . pravastatin (PRAVACHOL) 40 MG tablet Take 20 mg by mouth every evening.    04/03/2016 at Unknown time  . sitaGLIPtin (JANUVIA) 50 MG tablet Take 50 mg by mouth daily.   04/03/2016 at Unknown time  . tamsulosin (FLOMAX) 0.4 MG CAPS capsule Take 0.8 mg by mouth daily after breakfast.    03/13/2016 at Unknown time  . iron polysaccharides (NU-IRON) 150 MG capsule TAKE 1 CAPSULE EVERY DAY   on hold   Scheduled:  . antiseptic oral rinse  7 mL Mouth Rinse BID  .  aspirin EC  81 mg Oral Daily  . atorvastatin  80 mg Oral q1800  . Chlorhexidine Gluconate Cloth  6 each Topical Q0600  . furosemide  80 mg Intravenous Q6H  . insulin aspart  0-5 Units Subcutaneous QHS  . insulin aspart  0-9 Units Subcutaneous TID WC  . insulin glargine  8 Units Subcutaneous QHS  . metoprolol tartrate  25 mg Oral BID  . mupirocin ointment  1 application Nasal BID  . piperacillin-tazobactam (ZOSYN)  IV  3.375 g Intravenous Q8H  . sodium chloride flush  3 mL Intravenous Q12H  . tamsulosin  0.8 mg Oral QPM  . [START ON 04/07/2016] vancomycin  1,250 mg Intravenous Q48H  . vancomycin  1,500 mg Intravenous Once   Infusions:  . heparin 1,150 Units/hr (04/05/16 0330)    Assessment: 65yo male with history of HTN, HLD, CHF and DM2 presents with SOB. Pharmacy is consulted to dose heparin for ACS/chest pain and vancomycin/zosyn for sepsis.   HL 0.75 (supratherapeutic), Hgb 9.6, Plt wnl. No s/sx of bleeding noted. SCr 4.02 increasing, CrCl  borderline ~19 ml/min.  Goal of Therapy:  Heparin level 0.3-0.7 units/ml Monitor platelets by anticoagulation protocol: Yes   Plan:  - Decrease heparin to 1050 units/hr - Monitor 8 hr HL - Monitor daily HL, CBC and s/sx of bleeding - Change Zosyn to 2.25g IV q6h - Continue Vancomycin 1250 mg IV q48h - Monitor renal function, CBC and clinical progression  Dimitri Ped, PharmD. PGY-1 Pharmacy Resident Pager: [phone] 04/05/2016,11:17 AM

## 2016-04-05 NOTE — ED Notes (Signed)
EDP aware of VS 

## 2016-04-05 NOTE — Progress Notes (Signed)
CRITICAL VALUE ALERT  Critical value received:  Trop 6.41  Date of notification:  04/05/2016  Time of notification:  1620  Critical value read back:Yes.    Nurse who received alert:  Lesli Albee, RN  MD notified (1st page):  [place]  Time of first page:  1624  MD aware of findings, no active CP. Monitor pt on heparin gtt

## 2016-04-05 NOTE — Progress Notes (Signed)
PCCM Attending Note: Patient seen again during re-round. Currently on BiPAP with slightly improved work of breathing. Patient denies chest pain or pressure. No fever. Limited UOP from IV Lasix. Continuing close monitoring in ICU given risk of intubation. Checking serum procalcitonin per algorithm.  Sonia Baller Ashok Cordia, M.D. [facility] Pulmonary & Critical Care Pager:  [phone] After 3pm or if no response, call [phone] 3:54 PM 04/05/2016

## 2016-04-05 NOTE — Procedures (Signed)
MEDICATIONS ADMINISTERED: 1. Etomidate 30 mg IV push 2. Rocuronium 60 mg IV push  DESCRIPTION OF PROCEDURE: Need for intubation as well as the risk of the procedure including vocal cord injury and potentially death were discussed with the patient's niece and sister at bedside. Given patient's worsening respiratory failure they agreed to elective endotracheal intubation. Rapid sequence intubation was performed with etomidate followed by rocuronium once the patient was sedated. Patient was laying recumbent and preoxygenated with BiPAP mask. Video laryngoscope was inserted into the posterior pharynx with excellent view of vocal cords. Endotracheal tube 7.5 was inserted between the vocal cords with ease. Balloon was inflated. Bilateral breath sounds were auscultated by respiratory therapist. Repeat capnographic color change was appreciated. Endotracheal tube was secured at 26 cm at the teeth. Postprocedure stat portable chest x-ray and ABG pending.  COMPLICATIONS:  None.

## 2016-04-05 NOTE — ED Notes (Signed)
Dr. Schlossman at bedside. 

## 2016-04-05 NOTE — Progress Notes (Signed)
ANTICOAGULATION CONSULT NOTE - Follow Up Consult  Pharmacy Consult for Heparin Indication: chest pain/ACS  Allergies  Allergen Reactions  . Pork-Derived Products Other (See Comments)    Pt does not eat pork or pork products    Patient Measurements: Height: 5\' 8"  (172.7 cm) Weight: 196 lb 3.4 oz (89 kg) IBW/kg (Calculated) : 68.4   Vital Signs: Temp: 97.8 F (36.6 C) (05/29 2000) Temp Source: Oral (05/29 2000) BP: 105/70 mmHg (05/29 2100) Pulse Rate: 110 (05/29 2100)  Labs:  Recent Labs  03/11/2016 2038 04/05/16 0440 04/05/16 0809 04/05/16 0957 04/05/16 1320 04/05/16 1942  HGB 10.4* 9.6*  --   --   --   --   HCT 33.0* 31.5*  --   --   --   --   PLT 181 181  --   --   --   --   APTT  --  >200*  --   --   --   --   LABPROT  --  21.0*  --   --   --   --   INR  --  1.81*  --   --   --   --   HEPARINUNFRC  --   --   --  0.75*  --  >2.20*  CREATININE 3.41* 4.02*  --   --   --   --   TROPONINI  --  1.33* 2.00*  --  6.41*  --     Estimated Creatinine Clearance: 19.6 mL/min (by C-G formula based on Cr of 4.02).    Assessment: 65yo male with history of HTN, HLD, CHF and DM2 presents with SOB.  Heparin drip started for ACS.  Heparin drip 1050 uts/hr HL increased from 0.75 to > 2.2 on lower rate.  HL drawn from [place] and heparin infusing from peripheral IV.  Poor renal function may cause accumulation.      Goal of Therapy:  Heparin level 0.3-0.7 units/ml Monitor platelets by anticoagulation protocol: Yes   Plan:  Hold Heparin x1 hr Restart heparin drip 850 uts/hr Recheck AML  Bonnita Nasuti Pharm.D. CPP, BCPS Clinical Pharmacist [phone] 04/05/2016 9:19 PM

## 2016-04-06 ENCOUNTER — Inpatient Hospital Stay (HOSPITAL_COMMUNITY): Payer: Medicare HMO

## 2016-04-06 DIAGNOSIS — N189 Chronic kidney disease, unspecified: Secondary | ICD-10-CM

## 2016-04-06 DIAGNOSIS — R0600 Dyspnea, unspecified: Secondary | ICD-10-CM

## 2016-04-06 DIAGNOSIS — A419 Sepsis, unspecified organism: Principal | ICD-10-CM

## 2016-04-06 DIAGNOSIS — J9601 Acute respiratory failure with hypoxia: Secondary | ICD-10-CM | POA: Insufficient documentation

## 2016-04-06 DIAGNOSIS — I214 Non-ST elevation (NSTEMI) myocardial infarction: Secondary | ICD-10-CM

## 2016-04-06 DIAGNOSIS — N179 Acute kidney failure, unspecified: Secondary | ICD-10-CM | POA: Insufficient documentation

## 2016-04-06 LAB — CBC WITH DIFFERENTIAL/PLATELET
BASOS PCT: 0 %
Basophils Absolute: 0 10*3/uL (ref 0.0–0.1)
EOS PCT: 0 %
Eosinophils Absolute: 0 10*3/uL (ref 0.0–0.7)
HEMATOCRIT: 32.7 % — AB (ref 39.0–52.0)
Hemoglobin: 9.8 g/dL — ABNORMAL LOW (ref 13.0–17.0)
LYMPHS ABS: 1 10*3/uL (ref 0.7–4.0)
Lymphocytes Relative: 4 %
MCH: 29.3 pg (ref 26.0–34.0)
MCHC: 30 g/dL (ref 30.0–36.0)
MCV: 97.9 fL (ref 78.0–100.0)
MONO ABS: 3.1 10*3/uL — AB (ref 0.1–1.0)
MONOS PCT: 12 %
Neutro Abs: 21.7 10*3/uL — ABNORMAL HIGH (ref 1.7–7.7)
Neutrophils Relative %: 84 %
PLATELETS: 175 10*3/uL (ref 150–400)
RBC: 3.34 MIL/uL — ABNORMAL LOW (ref 4.22–5.81)
RDW: 13.8 % (ref 11.5–15.5)
WBC: 25.8 10*3/uL — ABNORMAL HIGH (ref 4.0–10.5)

## 2016-04-06 LAB — GLUCOSE, CAPILLARY
GLUCOSE-CAPILLARY: 144 mg/dL — AB (ref 65–99)
Glucose-Capillary: 148 mg/dL — ABNORMAL HIGH (ref 65–99)
Glucose-Capillary: 203 mg/dL — ABNORMAL HIGH (ref 65–99)
Glucose-Capillary: 54 mg/dL — ABNORMAL LOW (ref 65–99)
Glucose-Capillary: 146 mg/dL — ABNORMAL HIGH (ref 65–99)

## 2016-04-06 LAB — POCT ACTIVATED CLOTTING TIME
ACTIVATED CLOTTING TIME: 162 s
ACTIVATED CLOTTING TIME: 152 s
ACTIVATED CLOTTING TIME: 152 s
Activated Clotting Time: 157 seconds

## 2016-04-06 LAB — CORTISOL: Cortisol, Plasma: 8.3 ug/dL

## 2016-04-06 LAB — HEMOGLOBIN A1C
HEMOGLOBIN A1C: 6.3 % — AB (ref 4.8–5.6)
MEAN PLASMA GLUCOSE: 134 mg/dL

## 2016-04-06 LAB — BASIC METABOLIC PANEL
ANION GAP: 14 (ref 5–15)
ANION GAP: 12 (ref 5–15)
Anion gap: 11 (ref 5–15)
Anion gap: 11 (ref 5–15)
BUN: 83 mg/dL — AB (ref 6–20)
BUN: 86 mg/dL — AB (ref 6–20)
BUN: 58 mg/dL — ABNORMAL HIGH (ref 6–20)
BUN: 50 mg/dL — ABNORMAL HIGH (ref 6–20)
CHLORIDE: 110 mmol/L (ref 101–111)
CHLORIDE: 109 mmol/L (ref 101–111)
CHLORIDE: 104 mmol/L (ref 101–111)
CO2: 15 mmol/L — ABNORMAL LOW (ref 22–32)
CO2: 15 mmol/L — ABNORMAL LOW (ref 22–32)
CO2: 17 mmol/L — ABNORMAL LOW (ref 22–32)
CO2: 19 mmol/L — ABNORMAL LOW (ref 22–32)
CREATININE: 5.52 mg/dL — AB (ref 0.61–1.24)
Calcium: 7.7 mg/dL — ABNORMAL LOW (ref 8.9–10.3)
Calcium: 8.1 mg/dL — ABNORMAL LOW (ref 8.9–10.3)
Calcium: 7.8 mg/dL — ABNORMAL LOW (ref 8.9–10.3)
Calcium: 7.8 mg/dL — ABNORMAL LOW (ref 8.9–10.3)
Chloride: 105 mmol/L (ref 101–111)
Creatinine, Ser: 5.92 mg/dL — ABNORMAL HIGH (ref 0.61–1.24)
Creatinine, Ser: 3.89 mg/dL — ABNORMAL HIGH (ref 0.61–1.24)
Creatinine, Ser: 3.61 mg/dL — ABNORMAL HIGH (ref 0.61–1.24)
GFR calc Af Amer: 11 mL/min — ABNORMAL LOW (ref >60)
GFR calc Af Amer: 19 mL/min — ABNORMAL LOW (ref >60)
GFR calc non Af Amer: 10 mL/min — ABNORMAL LOW (ref >60)
GFR calc non Af Amer: 9 mL/min — ABNORMAL LOW (ref >60)
GFR calc non Af Amer: 16 mL/min — ABNORMAL LOW (ref >60)
GFR, EST AFRICAN AMERICAN: 10 mL/min — AB (ref >60)
GFR, EST AFRICAN AMERICAN: 17 mL/min — AB (ref >60)
GFR, EST NON AFRICAN AMERICAN: 15 mL/min — AB (ref >60)
GLUCOSE: 158 mg/dL — AB (ref 65–99)
Glucose, Bld: 62 mg/dL — ABNORMAL LOW (ref 65–99)
Glucose, Bld: 131 mg/dL — ABNORMAL HIGH (ref 65–99)
Glucose, Bld: 120 mg/dL — ABNORMAL HIGH (ref 65–99)
POTASSIUM: 4.4 mmol/L (ref 3.5–5.1)
POTASSIUM: 3.9 mmol/L (ref 3.5–5.1)
Potassium: 6.2 mmol/L (ref 3.5–5.1)
Potassium: 3.4 mmol/L — ABNORMAL LOW (ref 3.5–5.1)
SODIUM: 136 mmol/L (ref 135–145)
SODIUM: 138 mmol/L (ref 135–145)
SODIUM: 133 mmol/L — AB (ref 135–145)
Sodium: 135 mmol/L (ref 135–145)

## 2016-04-06 LAB — RENAL FUNCTION PANEL
ALBUMIN: 2.5 g/dL — AB (ref 3.5–5.0)
ANION GAP: 12 (ref 5–15)
ANION GAP: 11 (ref 5–15)
Albumin: 3 g/dL — ABNORMAL LOW (ref 3.5–5.0)
BUN: 78 mg/dL — AB (ref 6–20)
BUN: 58 mg/dL — ABNORMAL HIGH (ref 6–20)
CALCIUM: 7.7 mg/dL — AB (ref 8.9–10.3)
CALCIUM: 7.8 mg/dL — AB (ref 8.9–10.3)
CO2: 15 mmol/L — AB (ref 22–32)
CO2: 17 mmol/L — ABNORMAL LOW (ref 22–32)
Chloride: 108 mmol/L (ref 101–111)
Chloride: 105 mmol/L (ref 101–111)
Creatinine, Ser: 4.99 mg/dL — ABNORMAL HIGH (ref 0.61–1.24)
Creatinine, Ser: 3.89 mg/dL — ABNORMAL HIGH (ref 0.61–1.24)
GFR calc Af Amer: 13 mL/min — ABNORMAL LOW (ref >60)
GFR calc non Af Amer: 11 mL/min — ABNORMAL LOW (ref >60)
GFR, EST AFRICAN AMERICAN: 17 mL/min — AB (ref >60)
GFR, EST NON AFRICAN AMERICAN: 15 mL/min — AB (ref >60)
GLUCOSE: 171 mg/dL — AB (ref 65–99)
GLUCOSE: 129 mg/dL — AB (ref 65–99)
PHOSPHORUS: 2.8 mg/dL (ref 2.5–4.6)
POTASSIUM: 3.9 mmol/L (ref 3.5–5.1)
Phosphorus: 5.1 mg/dL — ABNORMAL HIGH (ref 2.5–4.6)
Potassium: 6.8 mmol/L (ref 3.5–5.1)
SODIUM: 135 mmol/L (ref 135–145)
SODIUM: 133 mmol/L — AB (ref 135–145)

## 2016-04-06 LAB — URINE CULTURE

## 2016-04-06 LAB — POCT I-STAT 3, ART BLOOD GAS (G3+)
Acid-base deficit: 12 mmol/L — ABNORMAL HIGH (ref 0.0–2.0)
Acid-base deficit: 11 mmol/L — ABNORMAL HIGH (ref 0.0–2.0)
BICARBONATE: 14.4 meq/L — AB (ref 20.0–24.0)
Bicarbonate: 14.3 mEq/L — ABNORMAL LOW (ref 20.0–24.0)
O2 SAT: 90 %
O2 SAT: 97 %
PCO2 ART: 36.4 mmHg (ref 35.0–45.0)
PCO2 ART: 30 mmHg — AB (ref 35.0–45.0)
PH ART: 7.207 — AB (ref 7.350–7.450)
PO2 ART: 98 mmHg (ref 80.0–100.0)
Patient temperature: 100.3
Patient temperature: 100.2
TCO2: 15 mmol/L (ref 0–100)
TCO2: 15 mmol/L (ref 0–100)
pH, Arterial: 7.294 — ABNORMAL LOW (ref 7.350–7.450)
pO2, Arterial: 73 mmHg — ABNORMAL LOW (ref 80.0–100.0)

## 2016-04-06 LAB — IRON AND TIBC
IRON: 10 ug/dL — AB (ref 45–182)
Saturation Ratios: 4 % — ABNORMAL LOW (ref 17.9–39.5)
TIBC: 244 ug/dL — AB (ref 250–450)
UIBC: 234 ug/dL

## 2016-04-06 LAB — SODIUM, URINE, RANDOM: SODIUM UR: 109 mmol/L

## 2016-04-06 LAB — HEPARIN LEVEL (UNFRACTIONATED): Heparin Unfractionated: 0.21 IU/mL — ABNORMAL LOW (ref 0.30–0.70)

## 2016-04-06 LAB — ECHOCARDIOGRAM COMPLETE
HEIGHTINCHES: 68 in
WEIGHTICAEL: 3171.1 [oz_av]

## 2016-04-06 LAB — CREATININE, URINE, RANDOM: Creatinine, Urine: 34.51 mg/dL

## 2016-04-06 LAB — PROCALCITONIN: Procalcitonin: 6.5 ng/mL

## 2016-04-06 LAB — MAGNESIUM: Magnesium: 2 mg/dL (ref 1.7–2.4)

## 2016-04-06 LAB — LACTIC ACID, PLASMA: Lactic Acid, Venous: 1.6 mmol/L (ref 0.5–2.0)

## 2016-04-06 MED ORDER — HEPARIN (PORCINE) IN NACL 100-0.45 UNIT/ML-% IJ SOLN
850.0000 [IU]/h | INTRAMUSCULAR | Status: DC
  Administered 2016-04-06: 950 [IU]/h via INTRAVENOUS

## 2016-04-06 MED ORDER — SODIUM CHLORIDE 0.9 % IV SOLN
510.0000 mg | INTRAVENOUS | Status: DC
  Administered 2016-04-06: 510 mg via INTRAVENOUS
  Filled 2016-04-06 (×2): qty 17

## 2016-04-06 MED ORDER — ARTIFICIAL TEARS OP OINT
1.0000 "application " | TOPICAL_OINTMENT | Freq: Three times a day (TID) | OPHTHALMIC | Status: DC
  Administered 2016-04-06 – 2016-04-08 (×6): 1 via OPHTHALMIC
  Filled 2016-04-06: qty 3.5

## 2016-04-06 MED ORDER — INSULIN REGULAR BOLUS VIA INFUSION
10.0000 [IU] | Freq: Once | INTRAVENOUS | Status: DC
  Filled 2016-04-06: qty 10

## 2016-04-06 MED ORDER — HEPARIN SODIUM (PORCINE) 5000 UNIT/ML IJ SOLN
250.0000 [IU]/h | INTRAMUSCULAR | Status: DC
  Administered 2016-04-06: 700 [IU]/h via INTRAVENOUS_CENTRAL
  Administered 2016-04-06: 800 [IU]/h via INTRAVENOUS_CENTRAL
  Administered 2016-04-06: 850 [IU]/h via INTRAVENOUS_CENTRAL
  Administered 2016-04-06: 250 [IU]/h via INTRAVENOUS_CENTRAL
  Administered 2016-04-07 (×2): 850 [IU]/h via INTRAVENOUS_CENTRAL
  Administered 2016-04-08: 1550 [IU]/h via INTRAVENOUS_CENTRAL
  Administered 2016-04-08: 1350 [IU]/h via INTRAVENOUS_CENTRAL
  Administered 2016-04-08: 1600 [IU]/h via INTRAVENOUS_CENTRAL
  Administered 2016-04-08: 1350 [IU]/h via INTRAVENOUS_CENTRAL
  Administered 2016-04-09: 1000 [IU]/h via INTRAVENOUS_CENTRAL
  Administered 2016-04-09: 1250 [IU]/h via INTRAVENOUS_CENTRAL
  Administered 2016-04-09 (×2): 1000 [IU]/h via INTRAVENOUS_CENTRAL
  Administered 2016-04-09: 1050 [IU]/h via INTRAVENOUS_CENTRAL
  Administered 2016-04-10 – 2016-04-11 (×4): 1000 [IU]/h via INTRAVENOUS_CENTRAL
  Administered 2016-04-11: 1050 [IU]/h via INTRAVENOUS_CENTRAL
  Administered 2016-04-12: 1200 [IU]/h via INTRAVENOUS_CENTRAL
  Filled 2016-04-06 (×18): qty 2

## 2016-04-06 MED ORDER — DEXTROSE 50 % IV SOLN
50.0000 mL | Freq: Once | INTRAVENOUS | Status: AC
  Administered 2016-04-06: 50 mL via INTRAVENOUS
  Filled 2016-04-06: qty 50

## 2016-04-06 MED ORDER — HEPARIN SODIUM (PORCINE) 1000 UNIT/ML DIALYSIS
1000.0000 [IU] | INTRAMUSCULAR | Status: DC | PRN
  Administered 2016-04-06 – 2016-04-12 (×2): 1000 [IU] via INTRAVENOUS_CENTRAL
  Filled 2016-04-06: qty 3
  Filled 2016-04-06 (×3): qty 6

## 2016-04-06 MED ORDER — MIDAZOLAM HCL 2 MG/2ML IJ SOLN: 2.0000 mg | Freq: Once | INTRAMUSCULAR | Status: DC | PRN

## 2016-04-06 MED ORDER — SODIUM CHLORIDE 0.9 % FOR CRRT
INTRAVENOUS_CENTRAL | Status: DC | PRN
  Filled 2016-04-06: qty 1000

## 2016-04-06 MED ORDER — PRISMASOL BGK 4/2.5 32-4-2.5 MEQ/L IV SOLN
INTRAVENOUS | Status: DC
  Administered 2016-04-06 – 2016-04-12 (×20): via INTRAVENOUS_CENTRAL
  Filled 2016-04-06 (×29): qty 5000

## 2016-04-06 MED ORDER — MIDAZOLAM HCL 2 MG/2ML IJ SOLN
1.0000 mg | INTRAMUSCULAR | Status: DC | PRN
  Administered 2016-04-06: 2 mg via INTRAVENOUS
  Filled 2016-04-06: qty 2

## 2016-04-06 MED ORDER — PERFLUTREN LIPID MICROSPHERE
INTRAVENOUS | Status: AC
  Administered 2016-04-06: 13:00:00
  Filled 2016-04-06: qty 10

## 2016-04-06 MED ORDER — CISATRACURIUM BOLUS VIA INFUSION
0.0500 mg/kg | Freq: Once | INTRAVENOUS | Status: AC
  Administered 2016-04-06: 4.5 mg via INTRAVENOUS
  Filled 2016-04-06: qty 5

## 2016-04-06 MED ORDER — SODIUM CHLORIDE 0.9% FLUSH
10.0000 mL | Freq: Two times a day (BID) | INTRAVENOUS | Status: DC
Start: 2016-04-06 — End: 2016-04-14
  Administered 2016-04-06 – 2016-04-08 (×5): 10 mL
  Administered 2016-04-08: 30 mL
  Administered 2016-04-09 (×2): 10 mL
  Administered 2016-04-10: 30 mL
  Administered 2016-04-10: 10 mL
  Administered 2016-04-11: 20 mL
  Administered 2016-04-11 – 2016-04-12 (×2): 30 mL
  Administered 2016-04-12: 20 mL

## 2016-04-06 MED ORDER — PERFLUTREN LIPID MICROSPHERE
1.0000 mL | INTRAVENOUS | Status: AC | PRN
  Filled 2016-04-06: qty 10

## 2016-04-06 MED ORDER — SODIUM CHLORIDE 0.9% FLUSH: 10.0000 mL | INTRAVENOUS | Status: DC | PRN

## 2016-04-06 MED ORDER — PRISMASOL BGK 4/2.5 32-4-2.5 MEQ/L IV SOLN
INTRAVENOUS | Status: DC
  Administered 2016-04-06 – 2016-04-12 (×51): via INTRAVENOUS_CENTRAL
  Filled 2016-04-06 (×72): qty 5000

## 2016-04-06 MED ORDER — DEXTROSE 50 % IV SOLN
INTRAVENOUS | Status: AC
  Administered 2016-04-06: 50 mL
  Filled 2016-04-06: qty 50

## 2016-04-06 MED ORDER — VANCOMYCIN HCL IN DEXTROSE 1-5 GM/200ML-% IV SOLN
1000.0000 mg | INTRAVENOUS | Status: DC
  Administered 2016-04-06 – 2016-04-07 (×2): 1000 mg via INTRAVENOUS
  Filled 2016-04-06 (×3): qty 200

## 2016-04-06 MED ORDER — INSULIN REGULAR HUMAN 100 UNIT/ML IJ SOLN
10.0000 [IU] | Freq: Once | INTRAMUSCULAR | Status: AC
  Administered 2016-04-06: 10 [IU] via INTRAVENOUS
  Filled 2016-04-06: qty 0.1

## 2016-04-06 MED ORDER — SODIUM BICARBONATE 8.4 % IV SOLN
INTRAVENOUS | Status: AC
  Administered 2016-04-06: 50 meq
  Filled 2016-04-06: qty 50

## 2016-04-06 MED ORDER — HEPARIN BOLUS VIA INFUSION (CRRT)
1000.0000 [IU] | INTRAVENOUS | Status: DC | PRN
  Filled 2016-04-06: qty 1000

## 2016-04-06 MED ORDER — SODIUM CHLORIDE 0.9 % IV SOLN
2.0000 mg/h | INTRAVENOUS | Status: DC
  Administered 2016-04-06 (×2): 10 mg/h via INTRAVENOUS
  Administered 2016-04-06: 2 mg/h via INTRAVENOUS
  Administered 2016-04-07: 10 mg/h via INTRAVENOUS
  Administered 2016-04-07: 6 mg/h via INTRAVENOUS
  Administered 2016-04-08: 4 mg/h via INTRAVENOUS
  Filled 2016-04-06 (×7): qty 10

## 2016-04-06 MED ORDER — CALCIUM CHLORIDE 10 % IV SOLN
1.0000 g | Freq: Once | INTRAVENOUS | Status: AC
  Administered 2016-04-06: 1 g via INTRAVENOUS
  Filled 2016-04-06: qty 10

## 2016-04-06 MED ORDER — PHENYLEPHRINE HCL 10 MG/ML IJ SOLN
0.0000 ug/min | INTRAMUSCULAR | Status: DC
  Administered 2016-04-06: 100 ug/min via INTRAVENOUS
  Administered 2016-04-06: 10 ug/min via INTRAVENOUS
  Administered 2016-04-06 (×3): 70 ug/min via INTRAVENOUS
  Administered 2016-04-07: 90 ug/min via INTRAVENOUS
  Administered 2016-04-07: 20 ug/min via INTRAVENOUS
  Administered 2016-04-07 (×2): 90 ug/min via INTRAVENOUS
  Filled 2016-04-06 (×10): qty 1

## 2016-04-06 MED ORDER — DOXYCYCLINE HYCLATE 100 MG IV SOLR
100.0000 mg | Freq: Two times a day (BID) | INTRAVENOUS | Status: DC
  Administered 2016-04-06 – 2016-04-08 (×5): 100 mg via INTRAVENOUS
  Filled 2016-04-06 (×6): qty 100

## 2016-04-06 MED ORDER — MIDAZOLAM HCL 2 MG/2ML IJ SOLN: 2.0000 mg | Freq: Once | INTRAMUSCULAR | Status: AC

## 2016-04-06 MED ORDER — SODIUM BICARBONATE 8.4 % IV SOLN
50.0000 meq | Freq: Once | INTRAVENOUS | Status: AC
  Administered 2016-04-06: 50 meq via INTRAVENOUS

## 2016-04-06 MED ORDER — SODIUM CHLORIDE 0.9 % IV SOLN
0.5000 ug/kg/min | INTRAVENOUS | Status: DC
  Administered 2016-04-06: 7.5 ug/kg/min via INTRAVENOUS
  Administered 2016-04-06: 3 ug/kg/min via INTRAVENOUS
  Administered 2016-04-07: 7.5 ug/kg/min via INTRAVENOUS
  Administered 2016-04-07: 4 ug/kg/min via INTRAVENOUS
  Filled 2016-04-06 (×5): qty 20

## 2016-04-06 MED ORDER — SODIUM POLYSTYRENE SULFONATE 15 GM/60ML PO SUSP
30.0000 g | Freq: Once | ORAL | Status: AC
  Administered 2016-04-06: 30 g
  Filled 2016-04-06: qty 120

## 2016-04-06 MED ORDER — FUROSEMIDE 10 MG/ML IJ SOLN
160.0000 mg | Freq: Four times a day (QID) | INTRAVENOUS | Status: DC
  Administered 2016-04-06 – 2016-04-08 (×5): 160 mg via INTRAVENOUS
  Filled 2016-04-06 (×8): qty 16

## 2016-04-06 MED ORDER — PRISMASOL BGK 4/2.5 32-4-2.5 MEQ/L IV SOLN
INTRAVENOUS | Status: DC
Start: 2016-04-06 — End: 2016-04-13
  Administered 2016-04-06 – 2016-04-12 (×18): via INTRAVENOUS_CENTRAL
  Filled 2016-04-06 (×25): qty 5000

## 2016-04-06 MED ORDER — MIDAZOLAM BOLUS VIA INFUSION
2.0000 mg | INTRAVENOUS | Status: DC | PRN
  Filled 2016-04-06: qty 2

## 2016-04-06 NOTE — Procedures (Signed)
Arterial Catheter Insertion Procedure Note Erle Mantooth [id number] 1951-08-22  Procedure: Insertion of Arterial Catheter  Indications: Blood pressure monitoring and Frequent blood sampling  Procedure Details Consent: Unable to obtain consent because of emergent medical necessity. Time Out: Verified patient identification, verified procedure, site/side was marked, verified correct patient position, special equipment/implants available, medications/allergies/relevent history reviewed, required imaging and test results available.  Performed  Maximum sterile technique was used including antiseptics, cap, gloves, gown, hand hygiene, mask and sheet. Skin prep: Chlorhexidine; local anesthetic administered 20 gauge catheter was inserted into right femoral artery using the Seldinger technique.  Evaluation Blood flow good; BP tracing good. Complications: No apparent complications.   Richardson Landry Minor ACNP Maryanna Shape PCCM Pager [phone] till 3 pm If no answer page [phone] 04/06/2016, 11:40 AM  Failed radials  [place] Supervised  Lavon Paganini. Titus Mould, MD, [place] Pgr: [place] [facility]monary & Critical [facility]

## 2016-04-06 NOTE — Procedures (Signed)
Hemodialysis Insertion Procedure Note Troy Young [id number] July 09, 1951  Procedure: Insertion of Hemodialysis Catheter Type: 3 port  Indications: Hemodialysis   Procedure Details Consent: Unable to obtain consent because of emergent medical necessity. Time Out: Verified patient identification, verified procedure, site/side was marked, verified correct patient position, special equipment/implants available, medications/allergies/relevent history reviewed, required imaging and test results available.  Performed  Maximum sterile technique was used including antiseptics, cap, gloves, gown, hand hygiene, mask and sheet. Skin prep: Chlorhexidine; local anesthetic administered A antimicrobial bonded/coated triple lumen catheter was placed in the right internal jugular vein using the Seldinger technique. Ultrasound guidance used.Yes.   Catheter placed to 16 cm. Blood aspirated via all 3 ports and then flushed x 3. Line sutured x 2 and dressing applied.  Evaluation Blood flow good Complications: No apparent complications Patient did tolerate procedure well. Chest X-ray ordered to verify placement.  CXR: pending.  Richardson Landry Minor ACNP Maryanna Shape PCCM Pager [phone] till 3 pm If no answer page [phone] 04/06/2016, 10:10 AM    Tolerated [place]  Troy Young J. Titus Mould, MD, [place] Pgr: [place] [facility]

## 2016-04-06 NOTE — Progress Notes (Signed)
Per Dr. Stevenson Clinch, ok to use central line.

## 2016-04-06 NOTE — Progress Notes (Signed)
RT NOTE:  RN called and stated Georgann Housekeeper, NP had decreased Peep through vent to 8+. RT verified and will update order. Pt tolerating changes

## 2016-04-06 NOTE — Progress Notes (Signed)
Spoke with pts family about pupils, pt right pupil is larger than the left, this is not new for him. His right eye also has a film over it, this is also not a new finding.

## 2016-04-06 NOTE — Progress Notes (Signed)
eLink Physician-Brief Progress Note Patient Name: Troy Young DOB: 1951-02-04 MRN: [id number]   Date of Service  04/06/2016  HPI/Events of Note  Hypotension - BP = 85/66.  eICU Interventions  Will order Phenylephrine IV infusion. Titrate to MAP >= 65 or SBP >= 90.     Intervention Category Major Interventions: Hypotension - evaluation and management  Jatara Huettner Eugene 04/06/2016, 2:56 AM

## 2016-04-06 NOTE — Progress Notes (Signed)
[facility] Progress Note Patient Name: Troy Young DOB: 1951/10/08 MRN: [id number]   Date of Service  04/06/2016  HPI/Events of Note  ABG 7.29/30  eICU Interventions  Dec RR from 35 to 30     Intervention Category Major Interventions: Respiratory failure - evaluation and management  Marjan Rosman 04/06/2016, 4:22 PM

## 2016-04-06 NOTE — Progress Notes (Signed)
ANTICOAGULATION CONSULT NOTE - Follow Up Consult  Pharmacy Consult for Heparin Indication: chest pain/ACS  Allergies  Allergen Reactions  . Pork-Derived Products Other (See Comments)    Pt does not eat pork or pork products    Patient Measurements: Height: 5\' 8"  (172.7 cm) Weight: 198 lb 3.1 oz (89.9 kg) IBW/kg (Calculated) : 68.4   Vital Signs: Temp: 98.6 F (37 C) (05/30 0000) Temp Source: Axillary (05/30 0000) BP: 83/67 mmHg (05/30 0315) Pulse Rate: 97 (05/30 0315)  Labs:  Recent Labs  03/20/2016 2038 04/05/16 0440 04/05/16 0809 04/05/16 0957 04/05/16 1320 04/05/16 1942 04/06/16 0227 04/06/16 0314  HGB 10.4* 9.6*  --   --   --   --  9.8*  --   HCT 33.0* 31.5*  --   --   --   --  32.7*  --   PLT 181 181  --   --   --   --  175  --   APTT  --  >200*  --   --   --   --   --   --   LABPROT  --  21.0*  --   --   --   --   --   --   INR  --  1.81*  --   --   --   --   --   --   HEPARINUNFRC  --   --   --  0.75*  --  >2.20*  --  0.21*  CREATININE 3.41* 4.02*  --   --   --   --  4.99*  --   TROPONINI  --  1.33* 2.00*  --  6.41*  --   --   --     Estimated Creatinine Clearance: 15.9 mL/min (by C-G formula based on Cr of 4.99).    Assessment: 65yo male with history of HTN, HLD, CHF and DM2 presents with SOB.  Heparin drip started for ACS.  Heparin drip 1050 uts/hr HL increased from 0.75 to > 2.2 on lower rate.  HL drawn from [facility] and heparin infusing from peripheral IV.  Poor renal function may cause accumulation.    Repeat HL is now subtherapeutic at 0.21 after holding heparin for 1h and reducing to 850 units/hr. Nurse reports no issues with infusion or bleeding.   Goal of Therapy:  Heparin level 0.3-0.7 units/ml Monitor platelets by anticoagulation protocol: Yes   Plan:  Increase heparin to 950 units/hr 8h HL Daily HL/CBC Monitor s/sx of bleeding  Andrey Cota. Diona Foley, PharmD, BCPS Clinical Pharmacist Pager [phone]  04/06/2016 4:12 AM

## 2016-04-06 NOTE — Progress Notes (Signed)
eLink Physician-Brief Progress Note Patient Name: Troy Young DOB: 1950-11-11 MRN: [id number]   Date of Service  04/06/2016  HPI/Events of Note  K+ = 6.8. EKG not widened on bedside monitor.  eICU Interventions  Will order: 1. Kayexalate 30 gm via gastric tube now. 2. Repeat BMP at 9 AM.     Intervention Category Major Interventions: Electrolyte abnormality - evaluation and management  Sommer,Steven Eugene 04/06/2016, 3:51 AM

## 2016-04-06 NOTE — Consult Note (Signed)
Reason for Consult:AKI/CKD4 Referring Physician: Dr. Bea Laura Merriweather is an 65 y.o. male.  HPI: 65 yr male with hx Type 2 DM, with retinopathy, CKD 4, Anemia ,Cancerous polyp of colon, CHF with EF 45%, HTN and HPTH.  Admitted 5/28 with 2 d of progressive dyspnea.  Wgt up 14 lb.  SOB progressive and entub on 5/29 and started requiring pressors.  Cr on admit 3.4, then 4.02 , 4.99, and now 5/2, K 6.8 (now 6.2). Bicarb 15.  CSR with ? infilt vs CHF.  Had pos en on admit.  Baseline Cr 2.5-3.6.  Now oliguric. Seen by Dr. Joelyn Oms in our office 3/17 with Cr 2.5.  Review of systems not obtained due to patient factors.   Past Medical History  Diagnosis Date  . 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  Marland Kitchen DM2 (diabetes mellitus, type 2) ([place])   . Hypertension   . Hyperlipidemia   . BPH (benign prostatic hyperplasia)   . Spontaneous pneumothorax     2  . Renal insufficiency   . Anemia   . Cancer ([place]) 11/08/2013    colon cancer (malignant polyp) removed  . CHF (congestive heart failure) ([place])   . Diabetic retinopathy ([place])     legally blind since 1995.    Past Surgical History  Procedure Laterality Date  . Polyp removed    . Eye surgery    . Cataract extraction      Family History  Problem Relation Age of Onset  . Cancer Father 33    prostate cancer  . Ulcers Mother   . Diabetes Sister   . Hypertension Sister   . Cancer Brother     lung cancer    Social History:  reports that he has quit smoking. His smoking use included Cigarettes. He smoked 0.00 packs per day for 0 years. He does not have any smokeless tobacco history on file. He reports that he does not drink alcohol. His drug history is not on file.  Allergies:  Allergies  Allergen Reactions  . Pork-Derived Products Other (See Comments)    Pt does not eat pork or pork products    Medications:  I have reviewed the patient's current medications. Prior to Admission:  Prescriptions prior to  admission  Medication Sig Dispense Refill Last Dose  . amLODipine (NORVASC) 10 MG tablet Take 10 mg by mouth daily.    03/09/2016 at Unknown time  . aspirin EC 81 MG tablet Take 81 mg by mouth daily.   03/11/2016 at Unknown time  . furosemide (LASIX) 40 MG tablet Take 1 tablet (40 mg total) by mouth 2 (two) times daily. 60 tablet 1 03/27/2016 at 1400  . glipiZIDE (GLUCOTROL XL) 10 MG 24 hr tablet Take 10 mg by mouth daily with breakfast.   03/30/2016 at Unknown time  . insulin glargine (LANTUS) 100 UNIT/ML injection Inject 9-15 Units into the skin at bedtime. On sliding scale   04/03/2016 at Unknown time  . losartan (COZAAR) 100 MG tablet TAKE 1 TABLET ONE TIME DAILY   04/03/2016 at Unknown time  . metoprolol tartrate (LOPRESSOR) 25 MG tablet Take 25 mg by mouth daily.    03/13/2016 at 1400  . Multiple Vitamin (MULTIVITAMIN WITH MINERALS) TABS tablet Take 1 tablet by mouth daily.   04/01/2016 at Unknown time  . pravastatin (PRAVACHOL) 40 MG tablet Take 20 mg by mouth every evening.    04/03/2016 at Unknown time  . sitaGLIPtin (JANUVIA) 50  MG tablet Take 50 mg by mouth daily.   03/26/2016 at Unknown time  . tamsulosin (FLOMAX) 0.4 MG CAPS capsule Take 0.8 mg by mouth daily after breakfast.    04/02/2016 at Unknown time  . iron polysaccharides (NU-IRON) 150 MG capsule TAKE 1 CAPSULE EVERY DAY   on hold    Results for orders placed or performed during the hospital encounter of 03/13/2016 (from the past 48 hour(s))  CBC with Differential     Status: Abnormal   Collection Time: 03/10/2016  8:38 PM  Result Value Ref Range   WBC 15.7 (H) 4.0 - 10.5 K/uL   RBC 3.47 (L) 4.22 - 5.81 MIL/uL   Hemoglobin 10.4 (L) 13.0 - 17.0 g/dL   HCT 33.0 (L) 39.0 - 52.0 %   MCV 95.1 78.0 - 100.0 fL   MCH 30.0 26.0 - 34.0 pg   MCHC 31.5 30.0 - 36.0 g/dL   RDW 13.5 11.5 - 15.5 %   Platelets 181 150 - 400 K/uL   Neutrophils Relative % 87 %   Neutro Abs 13.5 (H) 1.7 - 7.7 K/uL   Lymphocytes Relative 9 %   Lymphs Abs 1.4 0.7 -  4.0 K/uL   Monocytes Relative 4 %   Monocytes Absolute 0.7 0.1 - 1.0 K/uL   Eosinophils Relative 0 %   Eosinophils Absolute 0.0 0.0 - 0.7 K/uL   Basophils Relative 0 %   Basophils Absolute 0.1 0.0 - 0.1 K/uL  Comprehensive metabolic panel     Status: Abnormal   Collection Time: 03/22/2016  8:38 PM  Result Value Ref Range   Sodium 138 135 - 145 mmol/L   Potassium 4.6 3.5 - 5.1 mmol/L   Chloride 110 101 - 111 mmol/L   CO2 17 (L) 22 - 32 mmol/L   Glucose, Bld 176 (H) 65 - 99 mg/dL   BUN 54 (H) 6 - 20 mg/dL   Creatinine, Ser 3.41 (H) 0.61 - 1.24 mg/dL   Calcium 8.7 (L) 8.9 - 10.3 mg/dL   Total Protein 7.5 6.5 - 8.1 g/dL   Albumin 3.3 (L) 3.5 - 5.0 g/dL   AST 27 15 - 41 U/L   ALT 17 17 - 63 U/L   Alkaline Phosphatase 70 38 - 126 U/L   Total Bilirubin 1.0 0.3 - 1.2 mg/dL   GFR calc non Af Amer 17 (L) >60 mL/min   GFR calc Af Amer 20 (L) >60 mL/min    Comment: (NOTE) The eGFR has been calculated using the CKD EPI equation. This calculation has not been validated in all clinical situations. eGFR's persistently <60 mL/min signify possible Chronic Kidney Disease.    Anion gap 11 5 - 15  Brain natriuretic peptide     Status: Abnormal   Collection Time: 03/20/2016  8:38 PM  Result Value Ref Range   B Natriuretic Peptide 1241.1 (H) 0.0 - 100.0 pg/mL  I-Stat Troponin, ED - 0, 3, 6 hours (not at [facility])     Status: Abnormal   Collection Time: 03/17/2016  8:43 PM  Result Value Ref Range   Troponin i, poc 2.84 (HH) 0.00 - 0.08 ng/mL   Comment NOTIFIED PHYSICIAN    Comment 3            Comment: Due to the release kinetics of cTnI, a negative result within the first hours of the onset of symptoms does not rule out myocardial infarction with certainty. If myocardial infarction is still suspected, repeat the test at appropriate intervals.  CBG monitoring, ED     Status: Abnormal   Collection Time: 03/16/2016  8:43 PM  Result Value Ref Range   Glucose-Capillary 177 (H) 65 - 99 mg/dL  Blood  culture (routine x 2)     Status: None (Preliminary result)   Collection Time: 03/08/2016 10:17 PM  Result Value Ref Range   Specimen Description BLOOD RIGHT ANTECUBITAL    Special Requests BOTTLES DRAWN AEROBIC AND ANAEROBIC 5CC     Culture NO GROWTH < 24 HOURS    Report Status PENDING   I-Stat Troponin, ED - 0, 3, 6 hours (not at [facility])     Status: Abnormal   Collection Time: 03/09/2016 10:30 PM  Result Value Ref Range   Troponin i, poc 2.38 (HH) 0.00 - 0.08 ng/mL   Comment NOTIFIED PHYSICIAN    Comment 3            Comment: Due to the release kinetics of cTnI, a negative result within the first hours of the onset of symptoms does not rule out myocardial infarction with certainty. If myocardial infarction is still suspected, repeat the test at appropriate intervals.   I-Stat CG4 Lactic Acid, ED     Status: Abnormal   Collection Time: 03/15/2016 10:33 PM  Result Value Ref Range   Lactic Acid, Venous 2.81 (HH) 0.5 - 2.0 mmol/L   Comment NOTIFIED PHYSICIAN   Blood culture (routine x 2)     Status: None (Preliminary result)   Collection Time: 03/16/2016 10:39 PM  Result Value Ref Range   Specimen Description BLOOD RIGHT HAND    Special Requests BOTTLES DRAWN AEROBIC AND ANAEROBIC 5CC     Culture NO GROWTH < 24 HOURS    Report Status PENDING   I-Stat CG4 Lactic Acid, ED     Status: Abnormal   Collection Time: 04/05/16  1:03 AM  Result Value Ref Range   Lactic Acid, Venous 4.22 (HH) 0.5 - 2.0 mmol/L   Comment NOTIFIED PHYSICIAN   I-Stat Troponin, ED - 0, 3, 6 hours (not at [facility])     Status: Abnormal   Collection Time: 04/05/16  1:33 AM  Result Value Ref Range   Troponin i, poc 2.14 (HH) 0.00 - 0.08 ng/mL   Comment NOTIFIED PHYSICIAN    Comment 3            Comment: Due to the release kinetics of cTnI, a negative result within the first hours of the onset of symptoms does not rule out myocardial infarction with certainty. If myocardial infarction is still suspected, repeat the test at  appropriate intervals.   Urinalysis, Routine w reflex microscopic (not at [facility])     Status: Abnormal   Collection Time: 04/05/16  1:41 AM  Result Value Ref Range   Color, Urine YELLOW YELLOW   APPearance CLEAR CLEAR   Specific Gravity, Urine 1.013 1.005 - 1.030   pH 5.0 5.0 - 8.0   Glucose, UA NEGATIVE NEGATIVE mg/dL   Hgb urine dipstick NEGATIVE NEGATIVE   Bilirubin Urine NEGATIVE NEGATIVE   Ketones, ur NEGATIVE NEGATIVE mg/dL   Protein, ur 30 (A) NEGATIVE mg/dL   Nitrite NEGATIVE NEGATIVE   Leukocytes, UA NEGATIVE NEGATIVE  Urine culture     Status: Abnormal   Collection Time: 04/05/16  1:41 AM  Result Value Ref Range   Specimen Description URINE, RANDOM    Special Requests NONE    Culture MULTIPLE SPECIES PRESENT, SUGGEST RECOLLECTION (A)    Report Status 04/06/2016 FINAL   Urine  microscopic-add on     Status: Abnormal   Collection Time: 04/05/16  1:41 AM  Result Value Ref Range   Squamous Epithelial / LPF 0-5 (A) NONE SEEN   WBC, UA NONE SEEN 0 - 5 WBC/hpf   RBC / HPF NONE SEEN 0 - 5 RBC/hpf   Bacteria, UA RARE (A) NONE SEEN   Casts HYALINE CASTS (A) NEGATIVE  Glucose, capillary     Status: Abnormal   Collection Time: 04/05/16  3:50 AM  Result Value Ref Range   Glucose-Capillary 320 (H) 65 - 99 mg/dL   Comment 1 Capillary Specimen   MRSA PCR Screening     Status: Abnormal   Collection Time: 04/05/16  4:10 AM  Result Value Ref Range   MRSA by PCR POSITIVE (A) NEGATIVE    Comment:        The GeneXpert MRSA Assay (FDA approved for NASAL specimens only), is one component of a comprehensive MRSA colonization surveillance program. It is not intended to diagnose MRSA infection nor to guide or monitor treatment for MRSA infections. RESULT CALLED TO, READ BACK BY AND VERIFIED WITH: TOMLINSON,T RN [id number] 04/05/16 MITCHELL,L   Troponin I (q 6hr x 3)     Status: Abnormal   Collection Time: 04/05/16  4:40 AM  Result Value Ref Range   Troponin I 1.33 (HH) <0.031 ng/mL     Comment:        POSSIBLE MYOCARDIAL ISCHEMIA. SERIAL TESTING RECOMMENDED. CRITICAL RESULT CALLED TO, READ BACK BY AND VERIFIED WITH: SCHENBER R,RN 04/05/16 0558 WAYK   TSH     Status: Abnormal   Collection Time: 04/05/16  4:40 AM  Result Value Ref Range   TSH 5.047 (H) 0.350 - 4.500 uIU/mL  Protime-INR     Status: Abnormal   Collection Time: 04/05/16  4:40 AM  Result Value Ref Range   Prothrombin Time 21.0 (H) 11.6 - 15.2 seconds   INR 1.81 (H) 0.00 - 1.49  APTT     Status: Abnormal   Collection Time: 04/05/16  4:40 AM  Result Value Ref Range   aPTT >200 (HH) 24 - 37 seconds    Comment:        IF BASELINE aPTT IS ELEVATED, SUGGEST PATIENT RISK ASSESSMENT BE USED TO DETERMINE APPROPRIATE ANTICOAGULANT THERAPY. SPECIMEN CHECKED FOR CLOTS REPEATED TO VERIFY CRITICAL RESULT CALLED TO, READ BACK BY AND VERIFIED WITH: Pincus Sanes RN AT [phone] ON 05.29.2017 BY COCHRANE S   Hemoglobin A1c     Status: Abnormal   Collection Time: 04/05/16  4:40 AM  Result Value Ref Range   Hgb A1c MFr Bld 6.3 (H) 4.8 - 5.6 %    Comment: (NOTE)         Pre-diabetes: 5.7 - 6.4         Diabetes: >6.4         Glycemic control for adults with diabetes: <7.0    Mean Plasma Glucose 134 mg/dL    Comment: (NOTE) Performed At: [facility] [place], [place] [id number] Lindon Romp MD JM:[id number]   CBC with Differential/Platelet     Status: Abnormal   Collection Time: 04/05/16  4:40 AM  Result Value Ref Range   WBC 15.6 (H) 4.0 - 10.5 K/uL   RBC 3.23 (L) 4.22 - 5.81 MIL/uL   Hemoglobin 9.6 (L) 13.0 - 17.0 g/dL   HCT 31.5 (L) 39.0 - 52.0 %   MCV 97.5 78.0 - 100.0 fL   MCH 29.7 26.0 -  34.0 pg   MCHC 30.5 30.0 - 36.0 g/dL   RDW 13.7 11.5 - 15.5 %   Platelets 181 150 - 400 K/uL   Neutrophils Relative % 93 %   Neutro Abs 14.5 (H) 1.7 - 7.7 K/uL   Lymphocytes Relative 5 %   Lymphs Abs 0.7 0.7 - 4.0 K/uL   Monocytes Relative 2 %   Monocytes Absolute 0.3 0.1 - 1.0 K/uL    Eosinophils Relative 0 %   Eosinophils Absolute 0.0 0.0 - 0.7 K/uL   Basophils Relative 0 %   Basophils Absolute 0.0 0.0 - 0.1 K/uL  Comprehensive metabolic panel     Status: Abnormal   Collection Time: 04/05/16  4:40 AM  Result Value Ref Range   Sodium 136 135 - 145 mmol/L   Potassium 5.2 (H) 3.5 - 5.1 mmol/L   Chloride 112 (H) 101 - 111 mmol/L   CO2 14 (L) 22 - 32 mmol/L   Glucose, Bld 359 (H) 65 - 99 mg/dL   BUN 58 (H) 6 - 20 mg/dL   Creatinine, Ser 4.02 (H) 0.61 - 1.24 mg/dL   Calcium 7.8 (L) 8.9 - 10.3 mg/dL   Total Protein 7.2 6.5 - 8.1 g/dL   Albumin 2.9 (L) 3.5 - 5.0 g/dL   AST 42 (H) 15 - 41 U/L   ALT 32 17 - 63 U/L   Alkaline Phosphatase 61 38 - 126 U/L   Total Bilirubin 1.2 0.3 - 1.2 mg/dL   GFR calc non Af Amer 14 (L) >60 mL/min   GFR calc Af Amer 16 (L) >60 mL/min    Comment: (NOTE) The eGFR has been calculated using the CKD EPI equation. This calculation has not been validated in all clinical situations. eGFR's persistently <60 mL/min signify possible Chronic Kidney Disease.    Anion gap 10 5 - 15  Magnesium     Status: None   Collection Time: 04/05/16  4:40 AM  Result Value Ref Range   Magnesium 1.8 1.7 - 2.4 mg/dL  Phosphorus     Status: None   Collection Time: 04/05/16  4:40 AM  Result Value Ref Range   Phosphorus 3.7 2.5 - 4.6 mg/dL  Lactic acid, plasma     Status: Abnormal   Collection Time: 04/05/16  4:48 AM  Result Value Ref Range   Lactic Acid, Venous 4.2 (HH) 0.5 - 2.0 mmol/L    Comment: CRITICAL RESULT CALLED TO, READ BACK BY AND VERIFIED WITH: SCHENBER R,RN 04/05/16 0553 WAYK   Glucose, capillary     Status: Abnormal   Collection Time: 04/05/16  7:31 AM  Result Value Ref Range   Glucose-Capillary 335 (H) 65 - 99 mg/dL   Comment 1 Venous Specimen   Troponin I (q 6hr x 3)     Status: Abnormal   Collection Time: 04/05/16  8:09 AM  Result Value Ref Range   Troponin I 2.00 (HH) <0.031 ng/mL    Comment:        POSSIBLE MYOCARDIAL ISCHEMIA.  SERIAL TESTING RECOMMENDED. CRITICAL VALUE NOTED.  VALUE IS CONSISTENT WITH PREVIOUSLY REPORTED AND CALLED VALUE.   Heparin level (unfractionated)     Status: Abnormal   Collection Time: 04/05/16  9:57 AM  Result Value Ref Range   Heparin Unfractionated 0.75 (H) 0.30 - 0.70 IU/mL    Comment:        IF HEPARIN RESULTS ARE BELOW EXPECTED VALUES, AND PATIENT DOSAGE HAS BEEN CONFIRMED, SUGGEST FOLLOW UP TESTING OF ANTITHROMBIN III LEVELS.  Glucose, capillary     Status: Abnormal   Collection Time: 04/05/16 11:32 AM  Result Value Ref Range   Glucose-Capillary 367 (H) 65 - 99 mg/dL   Comment 1 Venous Specimen   Troponin I (q 6hr x 3)     Status: Abnormal   Collection Time: 04/05/16  1:20 PM  Result Value Ref Range   Troponin I 6.41 (HH) <0.031 ng/mL    Comment:        POSSIBLE MYOCARDIAL ISCHEMIA. SERIAL TESTING RECOMMENDED. CRITICAL VALUE NOTED.  VALUE IS CONSISTENT WITH PREVIOUSLY REPORTED AND CALLED VALUE.   Procalcitonin - Baseline     Status: None   Collection Time: 04/05/16  4:00 PM  Result Value Ref Range   Procalcitonin 3.96 ng/mL    Comment:        Interpretation: PCT > 2 ng/mL: Systemic infection (sepsis) is likely, unless other causes are known. (NOTE)         ICU PCT Algorithm               Non ICU PCT Algorithm    ----------------------------     ------------------------------         PCT < 0.25 ng/mL                 PCT < 0.1 ng/mL     Stopping of antibiotics            Stopping of antibiotics       strongly encouraged.               strongly encouraged.    ----------------------------     ------------------------------       PCT level decrease by               PCT < 0.25 ng/mL       >= 80% from peak PCT       OR PCT 0.25 - 0.5 ng/mL          Stopping of antibiotics                                             encouraged.     Stopping of antibiotics           encouraged.    ----------------------------     ------------------------------       PCT level  decrease by              PCT >= 0.25 ng/mL       < 80% from peak PCT        AND PCT >= 0.5 ng/mL            Continuing antibiotics                                               encouraged.       Continuing antibiotics            encouraged.    ----------------------------     ------------------------------     PCT level increase compared          PCT > 0.5 ng/mL         with peak PCT AND          PCT >= 0.5  ng/mL             Escalation of antibiotics                                          strongly encouraged.      Escalation of antibiotics        strongly encouraged.   Glucose, capillary     Status: Abnormal   Collection Time: 04/05/16  4:17 PM  Result Value Ref Range   Glucose-Capillary 328 (H) 65 - 99 mg/dL   Comment 1 Capillary Specimen   Culture, respiratory (NON-Expectorated)     Status: None (Preliminary result)   Collection Time: 04/05/16  6:24 PM  Result Value Ref Range   Specimen Description TRACHEAL ASPIRATE    Special Requests NONE    Gram Stain      RARE SQUAMOUS EPITHELIAL CELLS PRESENT NO ORGANISMS SEEN    Culture PENDING    Report Status PENDING   Heparin level (unfractionated)     Status: Abnormal   Collection Time: 04/05/16  7:42 PM  Result Value Ref Range   Heparin Unfractionated >2.20 (H) 0.30 - 0.70 IU/mL    Comment:        IF HEPARIN RESULTS ARE BELOW EXPECTED VALUES, AND PATIENT DOSAGE HAS BEEN CONFIRMED, SUGGEST FOLLOW UP TESTING OF ANTITHROMBIN III LEVELS. RESULTS CONFIRMED BY MANUAL DILUTION   Glucose, capillary     Status: Abnormal   Collection Time: 04/05/16  7:42 PM  Result Value Ref Range   Glucose-Capillary 303 (H) 65 - 99 mg/dL  I-STAT 3, arterial blood gas (G3+)     Status: Abnormal   Collection Time: 04/05/16  7:47 PM  Result Value Ref Range   pH, Arterial 7.237 (L) 7.350 - 7.450   pCO2 arterial 31.0 (L) 35.0 - 45.0 mmHg   pO2, Arterial 73.0 (L) 80.0 - 100.0 mmHg   Bicarbonate 13.3 (L) 20.0 - 24.0 mEq/L   TCO2 14 0 - 100 mmol/L    O2 Saturation 93.0 %   Acid-base deficit 13.0 (H) 0.0 - 2.0 mmol/L   Patient temperature 97.4 F    Sample type ARTERIAL   Glucose, capillary     Status: Abnormal   Collection Time: 04/05/16 11:17 PM  Result Value Ref Range   Glucose-Capillary 274 (H) 65 - 99 mg/dL  Magnesium     Status: None   Collection Time: 04/06/16  2:27 AM  Result Value Ref Range   Magnesium 2.0 1.7 - 2.4 mg/dL  CBC with Differential/Platelet     Status: Abnormal   Collection Time: 04/06/16  2:27 AM  Result Value Ref Range   WBC 25.8 (H) 4.0 - 10.5 K/uL   RBC 3.34 (L) 4.22 - 5.81 MIL/uL   Hemoglobin 9.8 (L) 13.0 - 17.0 g/dL   HCT 32.7 (L) 39.0 - 52.0 %   MCV 97.9 78.0 - 100.0 fL   MCH 29.3 26.0 - 34.0 pg   MCHC 30.0 30.0 - 36.0 g/dL   RDW 13.8 11.5 - 15.5 %   Platelets 175 150 - 400 K/uL   Neutrophils Relative % 84 %   Lymphocytes Relative 4 %   Monocytes Relative 12 %   Eosinophils Relative 0 %   Basophils Relative 0 %   Neutro Abs 21.7 (H) 1.7 - 7.7 K/uL   Lymphs Abs 1.0 0.7 - 4.0 K/uL   Monocytes Absolute 3.1 (H)  0.1 - 1.0 K/uL   Eosinophils Absolute 0.0 0.0 - 0.7 K/uL   Basophils Absolute 0.0 0.0 - 0.1 K/uL   RBC Morphology POLYCHROMASIA PRESENT     Comment: RARE NRBCs  Procalcitonin     Status: None   Collection Time: 04/06/16  2:27 AM  Result Value Ref Range   Procalcitonin 6.50 ng/mL    Comment:        Interpretation: PCT > 2 ng/mL: Systemic infection (sepsis) is likely, unless other causes are known. (NOTE)         ICU PCT Algorithm               Non ICU PCT Algorithm    ----------------------------     ------------------------------         PCT < 0.25 ng/mL                 PCT < 0.1 ng/mL     Stopping of antibiotics            Stopping of antibiotics       strongly encouraged.               strongly encouraged.    ----------------------------     ------------------------------       PCT level decrease by               PCT < 0.25 ng/mL       >= 80% from peak PCT       OR PCT 0.25  - 0.5 ng/mL          Stopping of antibiotics                                             encouraged.     Stopping of antibiotics           encouraged.    ----------------------------     ------------------------------       PCT level decrease by              PCT >= 0.25 ng/mL       < 80% from peak PCT        AND PCT >= 0.5 ng/mL            Continuing antibiotics                                               encouraged.       Continuing antibiotics            encouraged.    ----------------------------     ------------------------------     PCT level increase compared          PCT > 0.5 ng/mL         with peak PCT AND          PCT >= 0.5 ng/mL             Escalation of antibiotics                                          strongly encouraged.      Escalation of antibiotics  strongly encouraged.   Renal function panel     Status: Abnormal   Collection Time: 04/06/16  2:27 AM  Result Value Ref Range   Sodium 135 135 - 145 mmol/L   Potassium 6.8 (HH) 3.5 - 5.1 mmol/L    Comment: SLIGHT HEMOLYSIS CRITICAL RESULT CALLED TO, READ BACK BY AND VERIFIED WITH: SCHEMBER R,RN 04/06/16 0334 WAYK    Chloride 108 101 - 111 mmol/L   CO2 15 (L) 22 - 32 mmol/L   Glucose, Bld 171 (H) 65 - 99 mg/dL   BUN 78 (H) 6 - 20 mg/dL   Creatinine, Ser 4.99 (H) 0.61 - 1.24 mg/dL   Calcium 7.7 (L) 8.9 - 10.3 mg/dL   Phosphorus 5.1 (H) 2.5 - 4.6 mg/dL   Albumin 3.0 (L) 3.5 - 5.0 g/dL   GFR calc non Af Amer 11 (L) >60 mL/min   GFR calc Af Amer 13 (L) >60 mL/min    Comment: (NOTE) The eGFR has been calculated using the CKD EPI equation. This calculation has not been validated in all clinical situations. eGFR's persistently <60 mL/min signify possible Chronic Kidney Disease.    Anion gap 12 5 - 15  Heparin level (unfractionated)     Status: Abnormal   Collection Time: 04/06/16  3:14 AM  Result Value Ref Range   Heparin Unfractionated 0.21 (L) 0.30 - 0.70 IU/mL    Comment:        IF HEPARIN RESULTS  ARE BELOW EXPECTED VALUES, AND PATIENT DOSAGE HAS BEEN CONFIRMED, SUGGEST FOLLOW UP TESTING OF ANTITHROMBIN III LEVELS.   Glucose, capillary     Status: Abnormal   Collection Time: 04/06/16  4:02 AM  Result Value Ref Range   Glucose-Capillary 144 (H) 65 - 99 mg/dL  Glucose, capillary     Status: Abnormal   Collection Time: 04/06/16  8:05 AM  Result Value Ref Range   Glucose-Capillary 148 (H) 65 - 99 mg/dL   Comment 1 Capillary Specimen   Basic metabolic panel     Status: Abnormal   Collection Time: 04/06/16  8:39 AM  Result Value Ref Range   Sodium 136 135 - 145 mmol/L   Potassium 6.2 (HH) 3.5 - 5.1 mmol/L    Comment: NO VISIBLE HEMOLYSIS CRITICAL RESULT CALLED TO, READ BACK BY AND VERIFIED WITH: S.BURNHAM,RN [id number] 04/06/16 CLARK,S    Chloride 110 101 - 111 mmol/L   CO2 15 (L) 22 - 32 mmol/L   Glucose, Bld 158 (H) 65 - 99 mg/dL   BUN 83 (H) 6 - 20 mg/dL   Creatinine, Ser 5.52 (H) 0.61 - 1.24 mg/dL   Calcium 7.7 (L) 8.9 - 10.3 mg/dL   GFR calc non Af Amer 10 (L) >60 mL/min   GFR calc Af Amer 11 (L) >60 mL/min    Comment: (NOTE) The eGFR has been calculated using the CKD EPI equation. This calculation has not been validated in all clinical situations. eGFR's persistently <60 mL/min signify possible Chronic Kidney Disease.    Anion gap 11 5 - 15    Dg Chest 2 View  04/02/2016  CLINICAL DATA:  Acute onset of shortness of breath and vomiting. Dry cough. Initial encounter. EXAM: CHEST  2 VIEW COMPARISON:  Chest radiograph performed 09/28/2015 FINDINGS: The lungs are well-aerated. Vascular congestion is noted. Mild bibasilar opacities may reflect mild interstitial edema. There is no evidence of pleural effusion or pneumothorax. The heart is borderline normal in size. No acute osseous abnormalities are seen. IMPRESSION: Vascular congestion noted. Mild bibasilar  opacities may reflect mild interstitial edema. Electronically Signed   By: Garald Balding M.D.   On: 03/18/2016 20:21    Portable Chest Xray  04/05/2016  CLINICAL DATA:  Endotracheal tube placement.  Initial encounter. EXAM: PORTABLE CHEST 1 VIEW COMPARISON:  Chest radiograph performed 04/03/2016 FINDINGS: The patient's endotracheal tube is seen ending 3-4 cm above the carina. An enteric tube is noted extending below the diaphragm. Worsening bilateral lower lung zone airspace opacification raises concern for significantly worsening multifocal pneumonia, though pulmonary edema might have a similar appearance. No definite pleural effusion or pneumothorax is seen. The cardiomediastinal silhouette is normal in size. No acute osseous abnormalities are identified. IMPRESSION: 1. Endotracheal tube seen ending 3-4 cm above the carina. 2. Worsening bilateral lower lung zone airspace opacification is concerning for significantly worsened multifocal pneumonia, though pulmonary edema might have a similar appearance. Electronically Signed   By: Garald Balding M.D.   On: 04/05/2016 18:30    ROS Blood pressure 98/71, pulse 92, temperature 100.3 F (37.9 C), temperature source Oral, resp. rate 24, height _0  (1.727 m), weight 89.9 kg (198 lb 3.1 oz), SpO2 99 %. Physical Exam Physical Examination: General appearance - entub , sedated, only responds to loud voice and not purposeful. Mental status - as above Eyes - R eye deviates out, L EOMI Mouth - mucous membranes moist, pharynx normal without lesions Neck - adenopathy noted PCL Lymphatics - posterior cervical nodes Chest - rales noted bilat, decreased air entry noted bilat Heart - S1 and S2 normal, systolic murmur GU4/4 at apex Abdomen - distended, no bs, nontender Neurological - moves all extrem to stimulus 2+ edema Extremities - Feet very cool, hands less. No DP,  2+ edema Skin - trophic changes distally in feet  Assessment/Plan: 1 AKI hemodynamic with low bp and backround ARB.  Oliguric, acidemic, ^ K,  Vol xs. Will not be able to get vol off at this time but will  gradually advance. Need to control K , acid/base 2 CKD 4 more prone to AKI, check parameters 3 Hypertension: not an issue 4. Anemia eval 5. 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

## 2016-04-06 NOTE — Progress Notes (Signed)
37mL of versed and 28mL of fentanyl wasted in sink with Arletta Bale RN.

## 2016-04-06 NOTE — Progress Notes (Signed)
  Echocardiogram 2D Echocardiogram 2 mL of Definity has been performed.  Troy Young M 04/06/2016, 12:53 PM

## 2016-04-06 NOTE — Progress Notes (Signed)
PULMONARY / CRITICAL CARE MEDICINE   Name: Troy Young MRN: [id number] DOB: [birth year]    ADMISSION DATE:  03/13/2016  CHIEF COMPLAINT:  Difficulty Breathing  HISTORY OF PRESENT ILLNESS:   Troy Young is an 65 y.o. 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 He states that his symptoms began on Friday after he had food from [employer]. He has also complained of generalized edema, and is up about 15 lbs. In the ED, he was found to have a low-grade fever, and was treated for presumed sepsis with multiple liters of IVF, and his lactic acid rose from mid-2s to mid-4s. He denies subjective fevers, chills, etc. 5/29 required intubation and 100% fio2 with peep 14.      VITAL SIGNS: BP 89/66 mmHg  Pulse 88  Temp(Src) 100.3 F (37.9 C) (Oral)  Resp 24  Ht 5\' 8"  (1.727 m)  Wt 198 lb 3.1 oz (89.9 kg)  BMI 30.14 kg/m2  SpO2 99%  HEMODYNAMICS:    VENTILATOR SETTINGS: Vent Mode:  [-] PRVC FiO2 (%):  [40 %-100 %] 80 % Set Rate:  [20 bmp] 20 bmp Vt Set:  [550 mL] 550 mL PEEP:  [5 cmH20-14 cmH20] 14 cmH20 Pressure Support:  [7 cmH20] 7 cmH20 Plateau Pressure:  [17 cmH20-32 cmH20] 31 cmH20  INTAKE / OUTPUT: I/O last 3 completed shifts: In: 4097.4 [P.O.:240; I.V.:3607.4; IV Piggyback:250] Out: 865 [Urine:865]  PHYSICAL EXAMINATION: General:  Elderly man intubated, sedated , on pressors Neuro:  Blind, sedated heavily HEENT:  MMM, ott->vent Cardiovascular:  Heart sounds rrr Lungs:  Bibasilar crackles Abdomen:  Firm, but nontender, OGT to LWIS Musculoskeletal:  General edema Skin:  No visible rashes  LABS:  BMET  Recent Labs Lab 04/03/2016 2038 04/05/16 0440 04/06/16 0227  NA 138 136 135  K 4.6 5.2* 6.8*  CL 110 112* 108  CO2 17* 14* 15*  BUN 54* 58* 78*  CREATININE 3.41* 4.02* 4.99*  GLUCOSE 176* 359* 171*   Procal>>6.5  Electrolytes  Recent Labs Lab 04/03/2016 2038 04/05/16 0440 04/06/16 0227  CALCIUM 8.7* 7.8* 7.7*  MG  --  1.8 2.0  PHOS  --  3.7 5.1*    CBC  Recent Labs Lab 03/17/2016 2038 04/05/16 0440 04/06/16 0227  WBC 15.7* 15.6* 25.8*  HGB 10.4* 9.6* 9.8*  HCT 33.0* 31.5* 32.7*  PLT 181 181 175    Coag's  Recent Labs Lab 04/05/16 0440  APTT >200*  INR 1.81*    Sepsis Markers  Recent Labs Lab 03/11/2016 2233 04/05/16 0103 04/05/16 0448 04/05/16 1600 04/06/16 0227  LATICACIDVEN 2.81* 4.22* 4.2*  --   --   PROCALCITON  --   --   --  3.96 6.50    ABG  Recent Labs Lab 04/05/16 1947  PHART 7.237*  PCO2ART 31.0*  PO2ART 73.0*    Liver Enzymes  Recent Labs Lab 04/03/2016 2038 04/05/16 0440 04/06/16 0227  AST 27 42*  --   ALT 17 32  --   ALKPHOS 70 61  --   BILITOT 1.0 1.2  --   ALBUMIN 3.3* 2.9* 3.0*    Cardiac Enzymes  Recent Labs Lab 04/05/16 0440 04/05/16 0809 04/05/16 1320  TROPONINI 1.33* 2.00* 6.41*    Glucose  Recent Labs Lab 04/05/16 1132 04/05/16 1617 04/05/16 1942 04/05/16 2317 04/06/16 0402 04/06/16 0805  GLUCAP 367* 328* 303* 274* 144* 148*  Imaging Portable Chest Xray  04/05/2016  CLINICAL DATA:  Endotracheal tube placement.  Initial encounter. EXAM: PORTABLE CHEST 1 VIEW COMPARISON:  Chest radiograph performed 04/02/2016 FINDINGS: The patient's endotracheal tube is seen ending 3-4 cm above the carina. An enteric tube is noted extending below the diaphragm. Worsening bilateral lower lung zone airspace opacification raises concern for significantly worsening multifocal pneumonia, though pulmonary edema might have a similar appearance. No definite pleural effusion or pneumothorax is seen. The cardiomediastinal silhouette is normal in size. No acute osseous abnormalities are identified. IMPRESSION: 1. Endotracheal tube seen ending 3-4 cm above the carina. 2. Worsening bilateral lower lung zone airspace opacification  is concerning for significantly worsened multifocal pneumonia, though pulmonary edema might have a similar appearance. Electronically Signed   By: Garald Balding M.D.   On: 04/05/2016 18:30     STUDIES:  5/29 2 d >>  CULTURES: Blood 5/29 (pending)>> Urine 5/29 (pending)>> Sputum 5/29>>  ANTIBIOTICS: Ceftriaxone x1 dose 5/29 >> Vanc 5/29 >> Pip-tazo 5/29 >>  SIGNIFICANT EVENTS: Bolused 3L in ED 5/29 intubated 5/30 worsening renal failure, on pressors, hypekalemia  LINES/TUBES: PIV 5/29 OTT>> 5/30 rt rad a line>> 5/30 rt i j HD cath>>  DISCUSSION: 65 y/o man with Acute exacerbation of heart failure.  ASSESSMENT / PLAN:  PULMONARY A: Hypoxia due to Pulmonary Edema ? ARDS ? aspiration P:   Vent bundle Reheck abg and adjust vent as needed Abx Treat heart failure  CARDIOVASCULAR A:  Acute exacerbation of ischemic systolic cardiomyopathy Possible NSTEMI Possible component of sepsis HTN R/o myocarditis P:   May need CVVH for negative I/O if BP allows Heparin infusion Cards consulted and following echo  RENAL Lab Results  Component Value Date   CREATININE 4.99* 04/06/2016   CREATININE 4.02* 04/05/2016   CREATININE 3.41* 03/19/2016   CREATININE 3.60* 11/10/2015  \  Recent Labs Lab 04/03/2016 2038 04/05/16 0440 04/06/16 0227  K 4.6 5.2* 6.8*     A:   Acute on chronic kidney injury Hyperkalemia P:   Worsening renal function Hyperkalemia treated with Kayexalate ,and ca++ , d50 and insulin Place HD cath for possible cvvh and pressor support. Consulted renal  GASTROINTESTINAL A:   No active issues P:   NPO ppi likely start feeds in evening  HEMATOLOGIC A:   Will anticoag w/ heparin infusion for NSTEMI P: Cards following 2d ordered  INFECTIOUS A:   Possible infectious process R/o PNA R/o mycoarditis P:   Leukocytosis and fever concerning for infection, but CXR unconvincing, and U/A is normal. Empiric tx with vanc zosyn for now. Day  1/x  ENDOCRINE CBG (last 3)   Recent Labs  04/05/16 2317 04/06/16 0402 04/06/16 0805  GLUCAP 274* 144* 148*    A:   DM2 R/o rel AI P:   Will treat with SSI + basal (8 U nightly of lantus)  NEUROLOGIC A:   Blind Currently heavy sedation with Diprivan. Change to fet/versed due to decreased CO. P:   Daily WUA Switch to fentanyl and versed due to hypotension   FAMILY  - Updates: updated on admission, none currently at bedside.  - Inter-disciplinary family meet or Palliative Care meeting due by:  June 5  [place]. HD cath placed.  Vent rate increased to 24 to offset metabolic acidosis Check lactic acid abx and pan culture 2d pending Glucose/insulin ca++ for hyperkalemia Renal consult Performed by: Gaylyn Lambert ACNP 45 minutes  Richardson Landry Minor ACNP Maryanna Shape PCCM Pager [phone] till 3 pm If  no answer page [phone] 04/06/2016, 8:46 AM    STAFF NOTE: I, Merrie Roof, MD FACP have personally reviewed patient's available data, including medical history, events of note, physical examination and test results as part of my evaluation. I have discussed with resident/NP and other care providers such as pharmacist, RN and RRT. In addition, I personally evaluated patient and elicited key findings of: ronci, peep 14, deep rass, K rising, acidosis, HD cath after renal consult done, neo, a line needed, ABG reviewed, repeat ABG stat , likely needs higher MV needs, echo crucial for result asap, fever , failure? R/o myocarditis, although heart sie not large, last echo in past ef45%, add doxy for now, send rmsf, get travel history, chem renal repeat, neo to map goal, get cvp, maintain Vosysn for now, follow cultures closely, follow lactic acid and K closely, dc prop with shock The patient is critically ill with multiple organ systems failure and requires high complexity decision making for assessment and support, frequent evaluation and titration of therapies, application of  advanced monitoring technologies and extensive interpretation of multiple databases.   Critical Care Time devoted to patient care services described in this note is 30 Minutes. This time reflects time of care of this signee: Merrie Roof, MD FACP. This critical care time does not reflect procedure time, or teaching time or supervisory time of PA/NP/Med student/Med Resident etc but could involve care discussion time. Rest per NP/medical resident whose note is outlined above and that I agree with   Lavon Paganini. Titus Mould, MD, [place] Pgr: [facility] 04/06/2016 10:45 AM

## 2016-04-06 NOTE — Progress Notes (Signed)
HISTORY: Mr. Troy Young is a 78 M with PMHx of ICM (EF 45% on stress test), HTN, IDDM with CKD Stage IV who presented to the ED on 5/29 with shortness of breath, weight gain, orthopnea and found to have elevated troponins (1.33>2.00>6.41) and EKG changes with sinus tachycardia with ST depressions globally concerning for NSTEMI. Troy Young also presented with leukocytosis and fever concerning for infection and was started on vanc/zosyn. Over the course of admission, Troy Young became hypotensive despite IVF and had increased work of breathing on Bipap with respiratory acidosis and was subsequently intubated.   SUBJECTIVE:  Troy Young was seen and examined this morning. Troy Young is intubated and sedated.   OBJECTIVE:   Vitals:   Filed Vitals:   04/06/16 0700 04/06/16 0800 04/06/16 0809 04/06/16 0832  BP: 80/59 72/56  89/66  Pulse: 89 89  88  Temp:   100.3 F (37.9 C)   TempSrc:   Oral   Resp: 20 20  24   Height:      Weight:      SpO2: 100% 100%  99%   I&O's:    Intake/Output Summary (Last 24 hours) at 04/06/16 0843 Last data filed at 04/06/16 0800  Gross per 24 hour  Intake 1118.16 ml  Output    845 ml  Net 273.16 ml   TELEMETRY: Reviewed telemetry pt in sinus rhythm, HR 90s. No acute events on telemetry.  PHYSICAL EXAM General: Vital signs reviewed.  Troy Young is intubated, sedated, in no acute distress. Eyes: PERRLA, normal conjunctiva Neck: Supple, trachea midline, mild JVD.  Cardiovascular: RRR, S1 normal, S2 normal, no murmurs, gallops, or rubs. Pulmonary/Chest: Mild inspiratory crackles in lower lung fields bilaterally, no wheezing or rhonchi.  Abdominal: Distended, BS +, no guarding present.  Extremities: Trace pitting lower extremity edema bilaterally Neurological: Sedated Skin: Warm, dry and intact.   LABS: Basic Metabolic Panel:  Recent Labs  04/05/16 0440 04/06/16 0227  NA 136 135  K 5.2* 6.8*  CL 112* 108  CO2 14* 15*  GLUCOSE 359* 171*  BUN 58* 78*  CREATININE 4.02*  4.99*  CALCIUM 7.8* 7.7*  MG 1.8 2.0  PHOS 3.7 5.1*   Liver Function Tests:  Recent Labs  04/01/2016 2038 04/05/16 0440 04/06/16 0227  AST 27 42*  --   ALT 17 32  --   ALKPHOS 70 61  --   BILITOT 1.0 1.2  --   PROT 7.5 7.2  --   ALBUMIN 3.3* 2.9* 3.0*   CBC:  Recent Labs  04/05/16 0440 04/06/16 0227  WBC 15.6* 25.8*  NEUTROABS 14.5* 21.7*  HGB 9.6* 9.8*  HCT 31.5* 32.7*  MCV 97.5 97.9  PLT 181 175   Cardiac Enzymes:  Recent Labs  04/05/16 0440 04/05/16 0809 04/05/16 1320  TROPONINI 1.33* 2.00* 6.41*   Hemoglobin A1C:  Recent Labs  04/05/16 0440  HGBA1C 6.3*   Thyroid Function Tests:  Recent Labs  04/05/16 0440  TSH 5.047*   Coag Panel:   Lab Results  Component Value Date   INR 1.81* 04/05/2016   INR 1.43 09/29/2015    RADIOLOGY: Dg Chest 2 View  03/24/2016  CLINICAL DATA:  Acute onset of shortness of breath and vomiting. Dry cough. Initial encounter. EXAM: CHEST  2 VIEW COMPARISON:  Chest radiograph performed 09/28/2015 FINDINGS: The lungs are well-aerated. Vascular congestion is noted. Mild bibasilar opacities may reflect mild interstitial edema. There is no evidence of pleural effusion or pneumothorax. The heart is borderline normal in size. No acute osseous  abnormalities are seen. IMPRESSION: Vascular congestion noted. Mild bibasilar opacities may reflect mild interstitial edema. Electronically Signed   By: Garald Balding M.D.   On: 03/11/2016 20:21   Portable Chest Xray  04/05/2016  CLINICAL DATA:  Endotracheal tube placement.  Initial encounter. EXAM: PORTABLE CHEST 1 VIEW COMPARISON:  Chest radiograph performed 03/08/2016 FINDINGS: The Troy Young's endotracheal tube is seen ending 3-4 cm above the carina. An enteric tube is noted extending below the diaphragm. Worsening bilateral lower lung zone airspace opacification raises concern for significantly worsening multifocal pneumonia, though pulmonary edema might have a similar appearance. No definite  pleural effusion or pneumothorax is seen. The cardiomediastinal silhouette is normal in size. No acute osseous abnormalities are identified. IMPRESSION: 1. Endotracheal tube seen ending 3-4 cm above the carina. 2. Worsening bilateral lower lung zone airspace opacification is concerning for significantly worsened multifocal pneumonia, though pulmonary edema might have a similar appearance. Electronically Signed   By: Garald Balding M.D.   On: 04/05/2016 18:30   MPI on 07/17/2015 which showed intermediate risk stress nuclear study with a large, severe, partially reversible defect in the mid/distal anterior wall, distal inferior wall and apex; findings consistent with prior infarct and mild to moderate peri-infarct ischemia; EF 46 with apical hypokinesis and mild LVE.   Echo: 09/29/2015 - Left ventricle: The cavity size was normal. Wall thickness was  normal. The estimated ejection fraction was 45%. Mid  anteroseptal, apical septal, apical inferior, apical anterior,  and true apex severe hypokinesis. Features are consistent with a  pseudonormal left ventricular filling pattern, with concomitant  abnormal relaxation and increased filling pressure (grade 2  diastolic dysfunction). E/medial e&' > 15 suggests LV end  diastolic pressure at least 20 mmHg. - Aortic valve: There was no stenosis. There was trivial  regurgitation. - Mitral valve: There was moderate regurgitation. Effective  regurgitant orifice (PISA): 0.28 cm^2. - Left atrium: The atrium was mildly dilated. - Right ventricle: The cavity size was normal. Systolic function  was normal. - Tricuspid valve: There was moderate regurgitation. Peak RV-RA  gradient (S): 48 mm Hg. - Pulmonary arteries: PA peak pressure: 51 mm Hg (S). - Inferior vena cava: The vessel was normal in size. The  respirophasic diameter changes were in the normal range (>= 50%),  consistent with normal central venous pressure.  ASSESSMENT/PLAN:   Troy Young  is a 71 M with PMHx of ICM (EF 45% on echo 09/2015), HTN, IDDM with CKD Stage IV who presented to the ED on 5/29 with NSTEMI, acute respiratory failure 2/2 pulmonary edema now s/p intubation, and hypotension requiring pressors.    Acute on Chronic HFrEF: Likely secondary to non-compliance with fluid and salt restriction and due to underlying CKD IV. EF 45% on TTE in 09/2015. Baseline weight per Troy Young is 172-175. On admission, weight was up 13 pound at 188 and Troy Young complained of PND, orthopnea, edema. CXR showed vascular congestion with mild interstitial edema. TSH mildly elevated at 5. Troy Young was started on Lasix 80 mg IV Q6H. UOP was -865 yesterday. At home, Troy Young was on lasix 40 mg BID. Losartan 100 mg daily, metoprolol 25 mg BID and amlodipine 10 mg daily.  -Daily weights -Strict I/Os -Lasix 80 mg IV Q6H -On metoprolol 25 mg BID- holding for hypotension -Currently on Neo due to hypotension -Holding losartan and amlodipine due to hypotension -Repeat TTE -Intubated and sedated -Nephrology to be consulted for possibly CRRT given worsening CKD, hypotension and poor UOP  NSTEMI: Likely secondary to demand ischemia  from underlying acute on chronic HFrEF. Troponins have trended 1.33>2.00>6.4 and EKG changes with ST depressions globally. Troy Young was started on heparin gtt. Myoview in September 2016 showed intermediate risk stress nuclear study with a large, severe, partially reversible defect in the mid/distal anterior wall, distal inferior wall and apex; findings consistent with prior infarct and mild to moderate peri-infarct ischemia; EF 46 with apical hypokinesis and mild LVE. Troy Young has not had a cardiac cath. Given acute on chronic CKD IV, would hold off on cath at this time and watch for improvement. This was discussed with Troy Young yesterday who agreed with medical therapy at this time.  -Heparin gtt -ASA 81 mg daily -Atorvastatin 80 mg daily  Acute on Chronic CKD IV: Baseline creatinine  3.5 with baseline GFR in the 20s. This morning creatinine has increased to 4.99 with GRF of 13 in the setting of cardiorenal syndrome and diuresis. -850 UOP yesterday with only -350 of bloody urine out overnight.  -Continue Lasix 80 mg IV Q6H at this time, but with hypotension, worsening renal function, and inadequate urine output may require CRRT and nephrology consult -Continue to monitor renal function  Hyperkalemia: Potassium 6.8 this morning. Elink ordered kayexalate 30 grams per gastric tube.  -Repeat BMET at 9 am -On Lasix 80 mg Q6H   IDDM: Well controlled, HgbA1c 6.3 on admission. -On SSI and basal insulin  Possible Septic Shock: Possibly due to pneumonia. Troy Young presented with leukocytosis, mild fever of 100.4, but without concerning CXR findings of pneumonia or cough. Troy Young was started on Vanc/Zosyn and Neo for persistent hypotension despite IVF resuscitation. WBC 15>25 this morning. Procalcitonin elevated at 6.50. CXR yesterday showed worsening bilateral lower lung zone airspace opacification is concerning for significantly worsened multifocal pneumonia.  -Repeat CXR this morning  -Per primary -On Vanc/Zosyn -Intubated and sedated  Martyn Malay, DO PGY-2 Internal Medicine Resident Pager # [phone] 04/06/2016 8:43 AM  I have seen and examined the Troy Young along with [place], DO.  I have reviewed the chart, notes and new data.  I agree with her note.  Key new complaints: critically ill, intubated, sedated, no family Key examination changes: by weight is grossly hypervolemic, but lungs are surprisingly clear and JVP hard to see; oliguric; hypotensive on pressors Key new findings / data: requiring high FiO2 and PEEP, metabolic acidosis and hyperkalemia, worsening renal parameters ECG 5/28 consistent with left main or multivessel disease with global ischemia   Critically ill, very guarded prognosis, multisystem organ failure. Suspect underling critical CAD and global  ischemia due to sepsis/hypotension, rather than acute athero-thrombotic event as cause of his decompensation. I think we need invasive monitoring of CVP, it is very likely Troy Young will need CRRT. His hypoxic respiratory failure is probably primarily due to pneumonia/ARDS, but volume overload may be playing an increasingly important role.   Sanda Klein, MD, [place] [phone] 04/06/2016, 9:22 AM

## 2016-04-06 NOTE — Progress Notes (Signed)
Pharmacy Antibiotic Note  Troy Young is a 65 y.o. male admitted on 03/15/2016 with sepsis.  Pharmacy has been consulted for vancomycin/zosyn dosing. Pt starting CRRT.   Plan: Zosyn IV 2.25g q6h Change vancomycin IV to 1000g q24h for CVVHD Monitor clinical course, C&S, VT at SS  Height: 5\' 8"  (172.7 cm) Weight: 198 lb 3.1 oz (89.9 kg) IBW/kg (Calculated) : 68.4  Temp (24hrs), Avg:99.1 F (37.3 C), Min:97.8 F (36.6 C), Max:100.3 F (37.9 C)   Recent Labs Lab 03/10/2016 2038 03/17/2016 2233 04/05/16 0103 04/05/16 0440 04/05/16 0448 04/06/16 0227 04/06/16 0839  WBC 15.7*  --   --  15.6*  --  25.8*  --   CREATININE 3.41*  --   --  4.02*  --  4.99* 5.52*  LATICACIDVEN  --  2.81* 4.22*  --  4.2*  --   --     Estimated Creatinine Clearance: 14.3 mL/min (by C-G formula based on Cr of 5.52).    Allergies  Allergen Reactions  . Pork-Derived Products Other (See Comments)    Pt does not eat pork or pork products    Antimicrobials this admission: Vancomycin 5/29>>  Zosyn 5/29>> Doxycycline 5/30>  Dose adjustments this admission: Vancomycin 1250g q48h to Vancomycin 1g q24h on 5/30 for CRRT  Microbiology results: 5/28 BCx - ngtd 5/29 UCx - multiple species 5/29 sputum cx >> no organisms seen 5/29 MRSA PCR - positive  Thank you for allowing pharmacy to be a part of this patient's care.  Angela Burke, PharmD Pharmacy Resident Pager: [phone] 04/06/2016 1:13 PM

## 2016-04-06 NOTE — Progress Notes (Signed)
RT was told by Dr. Titus Mould to increase peep to 18. Patients plateau pressures were 47. RT decreased peep back to 14. RT waiting until paralytic is onboard to go back up on peep. Patient is currently maintaining on current vent settings. RT will continue to monitor.

## 2016-04-06 NOTE — Procedures (Signed)
Central Venous Catheter Insertion Procedure Note Haydn Muro [id number] 20-May-1951  Procedure: Insertion of Central Venous Catheter Indications: Drug and/or fluid administration  Procedure Details Consent: Unable to obtain consent because of emergent medical necessity. Time Out: Verified patient identification, verified procedure, site/side was marked, verified correct patient position, special equipment/implants available, medications/allergies/relevent history reviewed, required imaging and test results available.  Performed  Maximum sterile technique was used including antiseptics, cap, gloves, gown, hand hygiene, mask and sheet. Skin prep: Chlorhexidine; local anesthetic administered A antimicrobial bonded/coated triple lumen catheter was placed in the left internal jugular vein using the Seldinger technique. Ultrasound guidance used.Yes.   Catheter placed to 20 cm. Blood aspirated via all 3 ports and then flushed x 3. Line sutured x 2 and dressing applied.  Evaluation Blood flow good Complications: No apparent complications Patient did tolerate procedure well. Chest X-ray ordered to verify placement.  CXR: pending.  Georgann Housekeeper, AGACNP-BC [facility] Pulmonology/Critical Care Pager [phone] or [phone]  04/06/2016 8:09 PM

## 2016-04-06 NOTE — Progress Notes (Addendum)
ANTICOAGULATION CONSULT NOTE - Follow Up Consult  Pharmacy Consult for Heparin Indication: chest pain/ACS  Allergies  Allergen Reactions  . Pork-Derived Products Other (See Comments)    Pt does not eat pork or pork products    Patient Measurements: Height: 5\' 8"  (172.7 cm) Weight: 198 lb 3.1 oz (89.9 kg) IBW/kg (Calculated) : 68.4   Vital Signs: Temp: 100.2 F (37.9 C) (05/30 1117) Temp Source: Oral (05/30 1117) BP: 109/73 mmHg (05/30 1124) Pulse Rate: 97 (05/30 1124)  Labs:  Recent Labs  03/21/2016 2038 04/05/16 0440 04/05/16 0809 04/05/16 0957 04/05/16 1320 04/05/16 1942 04/06/16 0227 04/06/16 0314 04/06/16 0839  HGB 10.4* 9.6*  --   --   --   --  9.8*  --   --   HCT 33.0* 31.5*  --   --   --   --  32.7*  --   --   PLT 181 181  --   --   --   --  175  --   --   APTT  --  >200*  --   --   --   --   --   --   --   LABPROT  --  21.0*  --   --   --   --   --   --   --   INR  --  1.81*  --   --   --   --   --   --   --   HEPARINUNFRC  --   --   --  0.75*  --  >2.20*  --  0.21*  --   CREATININE 3.41* 4.02*  --   --   --   --  4.99*  --  5.52*  TROPONINI  --  1.33* 2.00*  --  6.41*  --   --   --   --     Estimated Creatinine Clearance: 14.3 mL/min (by C-G formula based on Cr of 5.52).  Assessment: 65yo male with history of HTN, HLD, CHF and DM2 presents with SOB.  Heparin drip started for ACS. Heparin gtt held for placement of HD cath but now to be restarted.   Goal of Therapy:  Heparin level 0.3-0.7 units/ml Monitor platelets by anticoagulation protocol: Yes   Plan:  Restart heparin at 950 units/hr 8h HL Daily HL/CBC Monitor s/sx of bleeding  Angela Burke, PharmD Pharmacy Resident Pager: [phone]  04/06/2016 1:11 PM

## 2016-04-07 ENCOUNTER — Inpatient Hospital Stay (HOSPITAL_COMMUNITY): Payer: Medicare HMO

## 2016-04-07 DIAGNOSIS — Z794 Long term (current) use of insulin: Secondary | ICD-10-CM

## 2016-04-07 DIAGNOSIS — I10 Essential (primary) hypertension: Secondary | ICD-10-CM

## 2016-04-07 DIAGNOSIS — R6521 Severe sepsis with septic shock: Secondary | ICD-10-CM

## 2016-04-07 DIAGNOSIS — E1121 Type 2 diabetes mellitus with diabetic nephropathy: Secondary | ICD-10-CM

## 2016-04-07 DIAGNOSIS — E1139 Type 2 diabetes mellitus with other diabetic ophthalmic complication: Secondary | ICD-10-CM

## 2016-04-07 DIAGNOSIS — E785 Hyperlipidemia, unspecified: Secondary | ICD-10-CM

## 2016-04-07 LAB — POCT I-STAT 3, ART BLOOD GAS (G3+)
Acid-Base Excess: 2 mmol/L (ref 0.0–2.0)
BICARBONATE: 24.1 meq/L — AB (ref 20.0–24.0)
BICARBONATE: 24.5 meq/L — AB (ref 20.0–24.0)
Bicarbonate: 20.1 mEq/L (ref 20.0–24.0)
O2 Saturation: 96 %
O2 Saturation: 100 %
O2 Saturation: 96 %
PCO2 ART: 16.3 mmHg — AB (ref 35.0–45.0)
PCO2 ART: 25.3 mmHg — AB (ref 35.0–45.0)
PCO2 ART: 35.7 mmHg (ref 35.0–45.0)
PH ART: 7.432 (ref 7.350–7.450)
PO2 ART: 50 mmHg — AB (ref 80.0–100.0)
PO2 ART: 72 mmHg — AB (ref 80.0–100.0)
Patient temperature: 91.1
TCO2: 21 mmol/L (ref 0–100)
TCO2: 25 mmol/L (ref 0–100)
TCO2: 26 mmol/L (ref 0–100)
pH, Arterial: 7.687 (ref 7.350–7.450)
pH, Arterial: 7.575 — ABNORMAL HIGH (ref 7.350–7.450)
pO2, Arterial: 130 mmHg — ABNORMAL HIGH (ref 80.0–100.0)

## 2016-04-07 LAB — CBC
HCT: 25.7 % — ABNORMAL LOW (ref 39.0–52.0)
Hemoglobin: 8.2 g/dL — ABNORMAL LOW (ref 13.0–17.0)
MCH: 29.2 pg (ref 26.0–34.0)
MCHC: 31.9 g/dL (ref 30.0–36.0)
MCV: 91.5 fL (ref 78.0–100.0)
PLATELETS: 147 10*3/uL — AB (ref 150–400)
RBC: 2.81 MIL/uL — ABNORMAL LOW (ref 4.22–5.81)
RDW: 13.6 % (ref 11.5–15.5)
WBC: 12.3 10*3/uL — ABNORMAL HIGH (ref 4.0–10.5)

## 2016-04-07 LAB — GLUCOSE, CAPILLARY
GLUCOSE-CAPILLARY: 136 mg/dL — AB (ref 65–99)
GLUCOSE-CAPILLARY: 155 mg/dL — AB (ref 65–99)
GLUCOSE-CAPILLARY: 159 mg/dL — AB (ref 65–99)
GLUCOSE-CAPILLARY: 178 mg/dL — AB (ref 65–99)
Glucose-Capillary: 114 mg/dL — ABNORMAL HIGH (ref 65–99)
Glucose-Capillary: 197 mg/dL — ABNORMAL HIGH (ref 65–99)
Glucose-Capillary: 141 mg/dL — ABNORMAL HIGH (ref 65–99)

## 2016-04-07 LAB — RENAL FUNCTION PANEL
ALBUMIN: 2.1 g/dL — AB (ref 3.5–5.0)
ANION GAP: 9 (ref 5–15)
BUN: 18 mg/dL (ref 6–20)
CHLORIDE: 101 mmol/L (ref 101–111)
CO2: 23 mmol/L (ref 22–32)
Calcium: 7.2 mg/dL — ABNORMAL LOW (ref 8.9–10.3)
Creatinine, Ser: 1.86 mg/dL — ABNORMAL HIGH (ref 0.61–1.24)
GFR, EST AFRICAN AMERICAN: 42 mL/min — AB (ref >60)
GFR, EST NON AFRICAN AMERICAN: 36 mL/min — AB (ref >60)
Glucose, Bld: 197 mg/dL — ABNORMAL HIGH (ref 65–99)
PHOSPHORUS: 3.6 mg/dL (ref 2.5–4.6)
POTASSIUM: 4.3 mmol/L (ref 3.5–5.1)
Sodium: 133 mmol/L — ABNORMAL LOW (ref 135–145)

## 2016-04-07 LAB — PROTIME-INR
INR: 2.19 — ABNORMAL HIGH (ref 0.00–1.49)
Prothrombin Time: 24.1 seconds — ABNORMAL HIGH (ref 11.6–15.2)

## 2016-04-07 LAB — POCT ACTIVATED CLOTTING TIME
ACTIVATED CLOTTING TIME: 157 s
ACTIVATED CLOTTING TIME: 167 s
ACTIVATED CLOTTING TIME: 178 s
ACTIVATED CLOTTING TIME: 183 s
ACTIVATED CLOTTING TIME: 188 s
ACTIVATED CLOTTING TIME: 188 s
ACTIVATED CLOTTING TIME: 193 s
ACTIVATED CLOTTING TIME: 193 s
ACTIVATED CLOTTING TIME: 198 s
ACTIVATED CLOTTING TIME: 209 s
ACTIVATED CLOTTING TIME: 209 s
ACTIVATED CLOTTING TIME: 214 s
ACTIVATED CLOTTING TIME: 214 s
Activated Clotting Time: 178 seconds
Activated Clotting Time: 214 seconds
Activated Clotting Time: 209 seconds
Activated Clotting Time: 173 seconds
Activated Clotting Time: 183 seconds
Activated Clotting Time: 183 seconds

## 2016-04-07 LAB — PHOSPHORUS
PHOSPHORUS: 1.6 mg/dL — AB (ref 2.5–4.6)
PHOSPHORUS: 3.6 mg/dL (ref 2.5–4.6)

## 2016-04-07 LAB — POCT I-STAT, CHEM 8
BUN: 26 mg/dL — ABNORMAL HIGH (ref 6–20)
CHLORIDE: 101 mmol/L (ref 101–111)
Calcium, Ion: 1.04 mmol/L — ABNORMAL LOW (ref 1.13–1.30)
Creatinine, Ser: 2.2 mg/dL — ABNORMAL HIGH (ref 0.61–1.24)
Glucose, Bld: 142 mg/dL — ABNORMAL HIGH (ref 65–99)
HEMATOCRIT: 26 % — AB (ref 39.0–52.0)
HEMOGLOBIN: 8.8 g/dL — AB (ref 13.0–17.0)
POTASSIUM: 3.7 mmol/L (ref 3.5–5.1)
SODIUM: 140 mmol/L (ref 135–145)
TCO2: 24 mmol/L (ref 0–100)

## 2016-04-07 LAB — BASIC METABOLIC PANEL
ANION GAP: 11 (ref 5–15)
Anion gap: 10 (ref 5–15)
BUN: 38 mg/dL — ABNORMAL HIGH (ref 6–20)
BUN: 32 mg/dL — AB (ref 6–20)
CALCIUM: 8 mg/dL — AB (ref 8.9–10.3)
CO2: 20 mmol/L — ABNORMAL LOW (ref 22–32)
CO2: 21 mmol/L — ABNORMAL LOW (ref 22–32)
CREATININE: 2.82 mg/dL — AB (ref 0.61–1.24)
CREATININE: 2.41 mg/dL — AB (ref 0.61–1.24)
Calcium: 7.8 mg/dL — ABNORMAL LOW (ref 8.9–10.3)
Chloride: 104 mmol/L (ref 101–111)
Chloride: 105 mmol/L (ref 101–111)
GFR calc Af Amer: 31 mL/min — ABNORMAL LOW (ref >60)
GFR, EST AFRICAN AMERICAN: 25 mL/min — AB (ref >60)
GFR, EST NON AFRICAN AMERICAN: 22 mL/min — AB (ref >60)
GFR, EST NON AFRICAN AMERICAN: 26 mL/min — AB (ref >60)
Glucose, Bld: 159 mg/dL — ABNORMAL HIGH (ref 65–99)
Glucose, Bld: 131 mg/dL — ABNORMAL HIGH (ref 65–99)
Potassium: 3.2 mmol/L — ABNORMAL LOW (ref 3.5–5.1)
Potassium: 3.4 mmol/L — ABNORMAL LOW (ref 3.5–5.1)
SODIUM: 135 mmol/L (ref 135–145)
SODIUM: 136 mmol/L (ref 135–145)

## 2016-04-07 LAB — HEPATITIS B SURFACE ANTIBODY,QUALITATIVE: HEP B S AB: NONREACTIVE

## 2016-04-07 LAB — MAGNESIUM: Magnesium: 2 mg/dL (ref 1.7–2.4)

## 2016-04-07 LAB — PARATHYROID HORMONE, INTACT (NO CA): PTH: 341 pg/mL — AB (ref 15–65)

## 2016-04-07 LAB — APTT

## 2016-04-07 LAB — HEPATITIS B SURFACE ANTIGEN: Hepatitis B Surface Ag: NEGATIVE

## 2016-04-07 LAB — HEPARIN LEVEL (UNFRACTIONATED): HEPARIN UNFRACTIONATED: 0.93 [IU]/mL — AB (ref 0.30–0.70)

## 2016-04-07 LAB — HEPATITIS B CORE ANTIBODY, IGM: HEP B C IGM: NEGATIVE

## 2016-04-07 LAB — PROCALCITONIN: Procalcitonin: 3.06 ng/mL

## 2016-04-07 LAB — ALBUMIN: ALBUMIN: 2.2 g/dL — AB (ref 3.5–5.0)

## 2016-04-07 MED ORDER — SODIUM PHOSPHATES 45 MMOLE/15ML IV SOLN
20.0000 mmol | Freq: Once | INTRAVENOUS | Status: AC
  Administered 2016-04-07: 20 mmol via INTRAVENOUS
  Filled 2016-04-07: qty 6.67

## 2016-04-07 MED ORDER — POTASSIUM CHLORIDE 10 MEQ/100ML IV SOLN
10.0000 meq | INTRAVENOUS | Status: AC
  Administered 2016-04-07 (×3): 10 meq via INTRAVENOUS
  Filled 2016-04-07: qty 100

## 2016-04-07 MED ORDER — POTASSIUM CHLORIDE 10 MEQ/50ML IV SOLN
10.0000 meq | INTRAVENOUS | Status: AC
  Administered 2016-04-07 (×2): 10 meq via INTRAVENOUS
  Filled 2016-04-07 (×2): qty 50

## 2016-04-07 MED ORDER — DEXTROSE 5 % IV SOLN
0.0000 ug/min | INTRAVENOUS | Status: DC
  Administered 2016-04-07: 90 ug/min via INTRAVENOUS
  Administered 2016-04-08: 100 ug/min via INTRAVENOUS
  Administered 2016-04-08: 85 ug/min via INTRAVENOUS
  Administered 2016-04-09: 80 ug/min via INTRAVENOUS
  Filled 2016-04-07 (×5): qty 4

## 2016-04-07 MED ORDER — DARBEPOETIN ALFA 200 MCG/0.4ML IJ SOSY
200.0000 ug | PREFILLED_SYRINGE | INTRAMUSCULAR | Status: DC
  Administered 2016-04-07: 200 ug via INTRAVENOUS
  Filled 2016-04-07 (×3): qty 0.4

## 2016-04-07 MED ORDER — PANTOPRAZOLE SODIUM 40 MG PO PACK
40.0000 mg | PACK | Freq: Every day | ORAL | Status: DC
  Administered 2016-04-08 – 2016-04-09 (×2): 40 mg
  Filled 2016-04-07 (×3): qty 20

## 2016-04-07 NOTE — Progress Notes (Signed)
PULMONARY / CRITICAL CARE MEDICINE   Name: Troy Young MRN: [id number] DOB: Jul 22, 1951    ADMISSION DATE:  04/05/2016  CHIEF COMPLAINT:  Difficulty Breathing  HISTORY OF PRESENT ILLNESS:   Troy Young is an 65 y.o. 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 He states that his symptoms began on Friday after he had food from [employer]. He has also complained of generalized edema, and is up about 15 lbs. In the ED, he was found to have a low-grade fever, and was treated for presumed sepsis with multiple liters of IVF, and his lactic acid rose from mid-2s to mid-4s. He denies subjective fevers, chills, etc. 5/29 required intubation and 100% fio2 with peep 14.   SUBJECTIVE: Difficult to oxygenate on 5/30 and pt was bucking the vent. Had to paralyze on 5/30. Tolerating CVVH. No other issues.    VITAL SIGNS: BP 84/64 mmHg  Pulse 76  Temp(Src) 92.9 F (33.8 C) (Oral)  Resp 20  Ht 5\' 8"  (1.727 m)  Wt 89.9 kg (198 lb 3.1 oz)  BMI 30.14 kg/m2  SpO2 99%  HEMODYNAMICS: CVP:  [9 mmHg-24 mmHg] 9 mmHg  VENTILATOR SETTINGS: Vent Mode:  [-] PRVC FiO2 (%):  [50 %-100 %] 60 % Set Rate:  [20 bmp-35 bmp] 20 bmp Vt Set:  [410 mL-550 mL] 410 mL PEEP:  [8 [id number] cmH20] 10 cmH20 Plateau Pressure:  [19 cmH20-47 cmH20] 19 cmH20  INTAKE / OUTPUT: I/O last 3 completed shifts: In: 5287.1 [I.V.:3602.1; Other:100; NG/GT:60; IV Piggyback:1525] Out: [id number] [Urine:3255; [id number]  PHYSICAL EXAMINATION: General:  Elderly man intubated, sedated , on pressors. paralyzed Neuro:  Blind, sedated heavily HEENT:  MMM, ott->vent Cardiovascular:  Heart sounds rrr Lungs:  Bibasilar crackles Abdomen:  Firm, but nontender, OGT to LWIS Musculoskeletal:  General edema Skin:  No visible rashes  LABS:  BMET  Recent Labs Lab 04/06/16 2030 04/07/16 0030  04/07/16 0355  NA 135 135 136  K 3.4* 3.2* 3.4*  CL 105 104 105  CO2 19* 20* 21*  BUN 50* 38* 32*  CREATININE 3.61* 2.82* 2.41*  GLUCOSE 120* 159* 131*  Procal>>6.5  Electrolytes  Recent Labs Lab 04/05/16 0440 04/06/16 0227  04/06/16 1840 04/06/16 2030 04/07/16 0030 04/07/16 0355  CALCIUM 7.8* 7.7*  < > 7.8*  7.8* 7.8* 8.0* 7.8*  MG 1.8 2.0  --   --   --   --  2.0  PHOS 3.7 5.1*  --  2.8  --   --  1.6*  < > = values in this interval not displayed.  CBC  Recent Labs Lab 04/05/16 0440 04/06/16 0227 04/07/16 0355  WBC 15.6* 25.8* 12.3*  HGB 9.6* 9.8* 8.2*  HCT 31.5* 32.7* 25.7*  PLT 181 175 147*    Coag's  Recent Labs Lab 04/05/16 0440 04/07/16 0355  APTT >200* >200*  INR 1.81* 2.19*    Sepsis Markers  Recent Labs Lab 04/05/16 0103 04/05/16 0448 04/05/16 1600 04/06/16 0227 04/06/16 1305 04/07/16 0355  LATICACIDVEN 4.22* 4.2*  --   --  1.6  --   PROCALCITON  --   --  3.96 6.50  --  3.06    ABG  Recent Labs Lab 04/06/16 1239 04/07/16 0342 04/07/16 0438  PHART 7.294* 7.687* 7.575*  PCO2ART 30.0* 16.3* 25.3*  PO2ART 98.0 50.0* 130.0*    Liver Enzymes  Recent Labs Lab 03/10/2016 2038  04/05/16 0440 04/06/16 0227 04/06/16 1840  AST 27 42*  --   --   ALT 17 32  --   --   ALKPHOS 70 61  --   --   BILITOT 1.0 1.2  --   --   ALBUMIN 3.3* 2.9* 3.0* 2.5*    Cardiac Enzymes  Recent Labs Lab 04/05/16 0440 04/05/16 0809 04/05/16 1320  TROPONINI 1.33* 2.00* 6.41*    Glucose  Recent Labs Lab 04/06/16 1115 04/06/16 1616 04/06/16 1643 04/06/16 1938 04/07/16 0007 04/07/16 0405  GLUCAP 203* 54* 146* 114* 159* 136*    Imaging Dg Chest Port 1 View  04/06/2016  CLINICAL DATA:  Central line placement EXAM: PORTABLE CHEST 1 VIEW COMPARISON:  Chest x-rays from earlier same day and 04/05/2016. FINDINGS: Status post left IJ central line placement with tip adequately positioned at the level of the upper SVC. Right IJ catheter stable in  position. Endotracheal tube remains well positioned with tip just above the level of the carina. Enteric tube passes below the diaphragm. Mild cardiomegaly is stable. Central pulmonary vascular congestion again noted. There is mild bilateral interstitial edema, improved compared to the chest x-ray from earlier today. No pleural effusion or pneumothorax seen. IMPRESSION: 1. New left IJ central line in place with tip adequately positioned at the level of the upper SVC. Could consider advancing for more optimal radiographic positioning. 2. Remainder of the support apparatus appears stable in position. 3. Improved aeration within each lung suggesting decreased edema (improved fluid status). Central pulmonary vascular congestion and mild perihilar edema persists. 4. No pneumothorax. Electronically Signed   By: Franki Cabot M.D.   On: 04/06/2016 20:56   Dg Chest Port 1 View  04/06/2016  CLINICAL DATA:  Status post central line placement EXAM: PORTABLE CHEST 1 VIEW COMPARISON:  04/05/2016 FINDINGS: Cardiac shadow is stable. Persistent bilateral lower lung opacities. A new nasogastric catheter and endotracheal tube are again seen and stable. A new right jugular catheter is noted. No pneumothorax is seen. Catheter tip is noted in the proximal superior vena cava. IMPRESSION: No pneumothorax following central line placement. Stable bilateral lower lung opacities. Electronically Signed   By: Inez Catalina M.D.   On: 04/06/2016 11:14     STUDIES:  5/29 2 d >>  CULTURES: Blood 5/29 (pending)>> (-) Urine 5/29 (pending)>> (-) Sputum 5/29>> (-)  ANTIBIOTICS: Ceftriaxone x1 dose 5/29 >> Vanc 5/29 >> Pip-tazo 5/29 >>  SIGNIFICANT EVENTS: Bolused 3L in ED 5/29 intubated 5/30 worsening renal failure, on pressors, hypekalemia  LINES/TUBES: PIV 5/29 OTT>> 5/30 rt rad a line>> 5/30 rt i j HD cath>> 5/30 L TLC >>  DISCUSSION: 65 y/o man with Acute exacerbation of heart failure.  ASSESSMENT /  PLAN:  PULMONARY A: Acute Hypoxemic Resp Failure due to Pulmonary Edema, Demand Ischemia, R/O G(-) HCAP or MRSA (-) Likely ARDS (although predominant picture now is more of failure) Aspiration PNA P:   Vent bundle Reheck abg and adjust vent as needed Abx pending culture > can deescalate in 1-2 d Treat heart failure  CARDIOVASCULAR A:  Acute exacerbation of ischemic systolic cardiomyopathy Possible NSTEMI Possible component of sepsis HTN R/o myocarditis P:  Heparin infusion Cards consulted and following Echo EF 40-45%  RENAL Lab Results  Component Value Date   CREATININE 2.41* 04/07/2016   CREATININE 2.82* 04/07/2016   CREATININE 3.61* 04/06/2016   CREATININE 3.60* 11/10/2015  \  Recent Labs Lab 04/06/16 2030 04/07/16 0030 04/07/16 0355  K 3.4* 3.2* 3.4*  A:   Acute on chronic kidney injury CKD 4 (baseline) Hyperkalemia P:   Cont CVVH On lasix pushes Renal consulted Correct electrolytes  GASTROINTESTINAL A:   No active issues P:   NPO for now as pt is paralyzed.  ppi   HEMATOLOGIC A:   Will anticoag w/ heparin infusion for NSTEMI P: Cards following Cont heparin drip  INFECTIOUS A:   Possible infectious process R/o PNA > G(-) PNA or MRSA PNA or Asp PNA P:   Cont vanc and zosyn and doxy for now. F/U on cultures. So far (-).   ENDOCRINE CBG (last 3)   Recent Labs  04/06/16 1938 04/07/16 0007 04/07/16 0405  GLUCAP 114* 159* 136*    A:   DM2  P:   Cont with SSI + basal (8 U nightly of lantus)  NEUROLOGIC A:   Blind  P:   Dec versed drip. Cont fentanyl drip. On paralytics > plan to wean off in am (48 hrs)   FAMILY  - Updates: updated on admission, none currently at bedside.Called sister Earlie Server and updated > no recent travel.   - Inter-disciplinary family meet or Palliative Care meeting due by:  June 5  [place] time today spent on this pt today was 30 mins.   Monica Becton, MD 04/07/2016, 7:24  AM [facility] Pager [phone] After 3 pm or if no answer, call [phone]

## 2016-04-07 NOTE — Progress Notes (Signed)
eLink Physician-Brief Progress Note Patient Name: Lakoda Tondre DOB: June 04, 1951 MRN: [id number]   Date of Service  04/07/2016  HPI/Events of Note  ABG 7.68/16/50  eICU Interventions  Reduce TV from 8 to 6cc/kg and rate from 30 to 20. Increase PEEP from 8 to 10 Repeat ABG     Intervention Category Intermediate Interventions: Other:  Donnivan Villena 04/07/2016, 3:50 AM

## 2016-04-07 NOTE — Progress Notes (Signed)
RT NOTE:  ISTAT critical ABG value called to Dr. Vaughan Browner. Vent changes made and documented. ABG repeat @ 430.

## 2016-04-07 NOTE — Progress Notes (Signed)
[place] for Heparin Indication: chest pain/ACS  Allergies  Allergen Reactions  . Pork-Derived Products Other (See Comments)    Pt does not eat pork or pork products    Patient Measurements: Height: 5\' 8"  (172.7 cm) Weight: 198 lb 3.1 oz (89.9 kg) IBW/kg (Calculated) : 68.4   Vital Signs: Temp: 91.7 F (33.2 C) (05/31 0400) Temp Source: Oral (05/31 0400) BP: 80/63 mmHg (05/31 0400) Pulse Rate: 68 (05/31 0400)  Labs:  Recent Labs  04/05/16 0440 04/05/16 0809  04/05/16 1320 04/05/16 1942 04/06/16 0227 04/06/16 0314  04/06/16 2030 04/07/16 0030 04/07/16 0355  HGB 9.6*  --   --   --   --  9.8*  --   --   --   --  8.2*  HCT 31.5*  --   --   --   --  32.7*  --   --   --   --  25.7*  PLT 181  --   --   --   --  175  --   --   --   --  147*  APTT >200*  --   --   --   --   --   --   --   --   --  >200*  LABPROT 21.0*  --   --   --   --   --   --   --   --   --  24.1*  INR 1.81*  --   --   --   --   --   --   --   --   --  2.19*  HEPARINUNFRC  --   --   < >  --  >2.20*  --  0.21*  --   --   --  0.93*  CREATININE 4.02*  --   --   --   --  4.99*  --   < > 3.61* 2.82* 2.41*  TROPONINI 1.33* 2.00*  --  6.41*  --   --   --   --   --   --   --   < > = values in this interval not displayed.  Estimated Creatinine Clearance: 32.8 mL/min (by C-G formula based on Cr of 2.41).  Assessment: 65yo male with NSTEMI awaiting cath , on CVVHD, for heparin   Goal of Therapy:  Heparin level 0.3-0.7 units/ml Monitor platelets by anticoagulation protocol: Yes   Plan:  Decrease Heparin 850 units/hr Check heparin level in 8 hours.   Phillis Knack, PharmD, BCPS   04/07/2016 5:45 AM

## 2016-04-07 NOTE — Progress Notes (Addendum)
Initial Nutrition Assessment  INTERVENTION:   Recommend: Vital 1.5 @ 40 ml/hr 60 ml Prostat TID Provides: 2040 kcal (106% of needs), 154 grams protein, and 733 ml H2O.   NUTRITION DIAGNOSIS:   Increased nutrient needs related to catabolic illness as evidenced by estimated needs.  GOAL:   Patient will meet greater than or equal to 90% of their needs  MONITOR:   Vent status, I & O's, Labs  REASON FOR ASSESSMENT:   Ventilator    ASSESSMENT:   PMHx of ICM (EF 45% on stress test), HTN, IDDM with CKD Stage IV who presented to the ED on 5/29 with acute hypoxemic respiratory failure requiring intubation, NSTEMI, and acute on chronic renal failure now on CRRT.  5/29 intubated, paralyzed 5/30 CRRT  Patient is currently intubated on ventilator support MV: 8.3 L/min Temp (24hrs), Avg:94.1 F (34.5 C), Min:90.7 F (32.6 C), Max:100.5 F (38.1 C)  Nutrition-Focused physical exam completed. Findings are no fat depletion, no muscle depletion, and mild edema.   Labs reviewed: K+ low 3.4, PO4 low 1.6 CBG: 136-178 OG tube Pt on pressors, MAP in low 60's this afternoon, diastolic BP currently 50  Diet Order:  Diet NPO time specified  Skin:  Reviewed, no issues  Last BM:  5/31  Height:   Ht Readings from Last 1 Encounters:  11/10/15 5' 7.5" (1.715 m)    Weight:   Wt Readings from Last 1 Encounters:  04/07/16 198 lb 3.1 oz (89.9 kg)  Admission weight 82.6 kg  Ideal Body Weight:  70 kg  BMI:  Body mass index is 30.14 kg/(m^2).  Estimated Nutritional Needs:   Kcal:  1922  Protein:  123-140 grams  Fluid:  > 1.9 L/day  EDUCATION NEEDS:   No education needs identified at this time  [place], [place], [place] Pager [phone] After Hours Pager

## 2016-04-07 NOTE — Progress Notes (Signed)
eLink Physician-Brief Progress Note Patient Name: Troy Young DOB: 12-02-1950 MRN: [id number]   Date of Service  04/07/2016  HPI/Events of Note  K is steadily decreasing. Last value is 3.2 Pt is having good urine output on lasix and Cr is improving.  eICU Interventions  Cautious repletion of K. 10 meq IV X 2 Recheck lytes in AM.     Intervention Category Intermediate Interventions: Electrolyte abnormality - evaluation and management  [place]umor Sun 04/07/2016, 2:19 AM

## 2016-04-07 NOTE — Progress Notes (Signed)
Subjective: Interval History: sedate, paralyzed.  Objective: Vital signs in last 24 hours: Temp:  [90.7 F (32.6 C)-100.5 F (38.1 C)] 92.9 F (33.8 C) (05/31 0500) Pulse Rate:  [65-124] 76 (05/31 0600) Resp:  [11-35] 20 (05/31 0600) BP: (72-125)/(53-81) 84/64 mmHg (05/31 0600) SpO2:  [95 %-100 %] 99 % (05/31 0600) Arterial Line BP: (88-135)/(52-75) 90/57 mmHg (05/31 0615) FiO2 (%):  [50 %-100 %] 60 % (05/31 0440) Weight:  [89.9 kg (198 lb 3.1 oz)] 89.9 kg (198 lb 3.1 oz) (05/31 0248) Weight change: 0 kg (0 lb)  Intake/Output from previous day: 05/30 0701 - 05/31 0700 In: 4939.4 [I.V.:3354.4; NG/GT:60; IV Piggyback:1425] Out: 4904 [Urine:2905] Intake/Output this shift:    General appearance: sedated, paralyzed Neck: RIJ cath Resp: diminished breath sounds bilaterally and rales bibasilar Cardio: S1, S2 normal and systolic murmur: holosystolic 2/6, blowing at apex GI: distended, has some bs Extremities: edema 2-3+  Lab Results:  Recent Labs  04/06/16 0227 04/07/16 0355  WBC 25.8* 12.3*  HGB 9.8* 8.2*  HCT 32.7* 25.7*  PLT 175 147*   BMET:  Recent Labs  04/07/16 0030 04/07/16 0355  NA 135 136  K 3.2* 3.4*  CL 104 105  CO2 20* 21*  GLUCOSE 159* 131*  BUN 38* 32*  CREATININE 2.82* 2.41*  CALCIUM 8.0* 7.8*   No results for input(s): PTH in the last 72 hours. Iron Studies:  Recent Labs  04/06/16 1305  IRON 10*  TIBC 244*    Studies/Results: Dg Chest Port 1 View  04/06/2016  CLINICAL DATA:  Central line placement EXAM: PORTABLE CHEST 1 VIEW COMPARISON:  Chest x-rays from earlier same day and 04/05/2016. FINDINGS: Status post left IJ central line placement with tip adequately positioned at the level of the upper SVC. Right IJ catheter stable in position. Endotracheal tube remains well positioned with tip just above the level of the carina. Enteric tube passes below the diaphragm. Mild cardiomegaly is stable. Central pulmonary vascular congestion again noted.  There is mild bilateral interstitial edema, improved compared to the chest x-ray from earlier today. No pleural effusion or pneumothorax seen. IMPRESSION: 1. New left IJ central line in place with tip adequately positioned at the level of the upper SVC. Could consider advancing for more optimal radiographic positioning. 2. Remainder of the support apparatus appears stable in position. 3. Improved aeration within each lung suggesting decreased edema (improved fluid status). Central pulmonary vascular congestion and mild perihilar edema persists. 4. No pneumothorax. Electronically Signed   By: Franki Cabot M.D.   On: 04/06/2016 20:56   Dg Chest Port 1 View  04/06/2016  CLINICAL DATA:  Status post central line placement EXAM: PORTABLE CHEST 1 VIEW COMPARISON:  04/05/2016 FINDINGS: Cardiac shadow is stable. Persistent bilateral lower lung opacities. A new nasogastric catheter and endotracheal tube are again seen and stable. A new right jugular catheter is noted. No pneumothorax is seen. Catheter tip is noted in the proximal superior vena cava. IMPRESSION: No pneumothorax following central line placement. Stable bilateral lower lung opacities. Electronically Signed   By: Inez Catalina M.D.   On: 04/06/2016 11:14   Portable Chest Xray  04/05/2016  CLINICAL DATA:  Endotracheal tube placement.  Initial encounter. EXAM: PORTABLE CHEST 1 VIEW COMPARISON:  Chest radiograph performed 03/11/2016 FINDINGS: The patient's endotracheal tube is seen ending 3-4 cm above the carina. An enteric tube is noted extending below the diaphragm. Worsening bilateral lower lung zone airspace opacification raises concern for significantly worsening multifocal pneumonia, though pulmonary edema  might have a similar appearance. No definite pleural effusion or pneumothorax is seen. The cardiomediastinal silhouette is normal in size. No acute osseous abnormalities are identified. IMPRESSION: 1. Endotracheal tube seen ending 3-4 cm above the  carina. 2. Worsening bilateral lower lung zone airspace opacification is concerning for significantly worsened multifocal pneumonia, though pulmonary edema might have a similar appearance. Electronically Signed   By: Garald Balding M.D.   On: 04/05/2016 18:30    I have reviewed the patient's current medications.  Assessment/Plan: 1 CKD4/AKI  Has some urine in response to Lasix so will cont.  Not enough to keep up with vol being given and has vol xs on CXR.  CVP not bad. Will try to slowly lower vol.  Acid base improving.  K low with diuretics, stool output.  Solute better. 2 Acute Resp failure per CCM  ? infx 3 DM controlled 4 Anemia got Fe, add ESA esp with JW status 5 Pneu on 2 AB 6 BP volatile on pressor currently 7 CAD per Cards P Net neg 75, cont CRRT, replete K, cont AB, follow hemodynamics closely,  Vent,     LOS: 3 days   Aveer Bartow L 04/07/2016,7:46 AM

## 2016-04-07 NOTE — Progress Notes (Signed)
HISTORY: Troy Young is a 36 M with PMHx of ICM (EF 45% on stress test), HTN, IDDM with CKD Stage IV who presented to the ED on 5/29 with acute hypoxemic respiratory failure requiring intubation, NSTEMI, and acute on chronic renal failure now on CRRT.   SUBJECTIVE:  Patient was seen and examined this morning. He is intubated and sedated.   OBJECTIVE:   Vitals:   Filed Vitals:   04/07/16 0400 04/07/16 0500 04/07/16 0600 04/07/16 0801  BP: 80/63  84/64 92/56  Pulse: 68  76 75  Temp: 91.7 F (33.2 C) 92.9 F (33.8 C)    TempSrc: Oral Oral    Resp: 20  20 20   Height:      Weight:      SpO2: 99%  99% 98%   I&O's:    Intake/Output Summary (Last 24 hours) at 04/07/16 0905 Last data filed at 04/07/16 0900  Gross per 24 hour  Intake 4596.94 ml  Output   5152 ml  Net -555.06 ml   TELEMETRY: Reviewed telemetry pt in sinus rhythm, HR 60-70s. No acute events on telemetry.  PHYSICAL EXAM General: Vital signs reviewed.  Patient is intubated, sedated, ill-appearing, in no acute distress. Neck: Supple, trachea midline, intubated.  Cardiovascular: RRR, S1 normal, S2 normal. Pulmonary/Chest: Clear anterior auscultation, no wheezing or rhonchi.  Abdominal: Distended, BS +, no guarding present.  Extremities: Trace pitting lower extremity edema bilaterally Neurological: Sedated  LABS: Basic Metabolic Panel:  Recent Labs  04/06/16 0227  04/06/16 1840  04/07/16 0030 04/07/16 0355 04/07/16 0756  NA 135  < > 133*  133*  < > 135 136 140  K 6.8*  < > 3.9  3.9  < > 3.2* 3.4* 3.7  CL 108  < > 104  105  < > 104 105 101  CO2 15*  < > 17*  17*  < > 20* 21*  --   GLUCOSE 171*  < > 131*  129*  < > 159* 131* 142*  BUN 78*  < > 58*  58*  < > 38* 32* 26*  CREATININE 4.99*  < > 3.89*  3.89*  < > 2.82* 2.41* 2.20*  CALCIUM 7.7*  < > 7.8*  7.8*  < > 8.0* 7.8*  --   MG 2.0  --   --   --   --  2.0  --   PHOS 5.1*  --  2.8  --   --  1.6*  --   < > = values in this interval not  displayed. Liver Function Tests:  Recent Labs  03/09/2016 2038 04/05/16 0440 04/06/16 0227 04/06/16 1840  AST 27 42*  --   --   ALT 17 32  --   --   ALKPHOS 70 61  --   --   BILITOT 1.0 1.2  --   --   PROT 7.5 7.2  --   --   ALBUMIN 3.3* 2.9* 3.0* 2.5*   CBC:  Recent Labs  04/05/16 0440 04/06/16 0227 04/07/16 0355 04/07/16 0756  WBC 15.6* 25.8* 12.3*  --   NEUTROABS 14.5* 21.7*  --   --   HGB 9.6* 9.8* 8.2* 8.8*  HCT 31.5* 32.7* 25.7* 26.0*  MCV 97.5 97.9 91.5  --   PLT 181 175 147*  --    Cardiac Enzymes:  Recent Labs  04/05/16 0440 04/05/16 0809 04/05/16 1320  TROPONINI 1.33* 2.00* 6.41*   Hemoglobin A1C:  Recent Labs  04/05/16 0440  HGBA1C 6.3*   Thyroid Function Tests:  Recent Labs  04/05/16 0440  TSH 5.047*   Coag Panel:   Lab Results  Component Value Date   INR 2.19* 04/07/2016   INR 1.81* 04/05/2016   INR 1.43 09/29/2015    RADIOLOGY: Dg Chest 2 View  03/08/2016  CLINICAL DATA:  Acute onset of shortness of breath and vomiting. Dry cough. Initial encounter. EXAM: CHEST  2 VIEW COMPARISON:  Chest radiograph performed 09/28/2015 FINDINGS: The lungs are well-aerated. Vascular congestion is noted. Mild bibasilar opacities may reflect mild interstitial edema. There is no evidence of pleural effusion or pneumothorax. The heart is borderline normal in size. No acute osseous abnormalities are seen. IMPRESSION: Vascular congestion noted. Mild bibasilar opacities may reflect mild interstitial edema. Electronically Signed   By: Garald Balding M.D.   On: 03/22/2016 20:21   Dg Chest Port 1 View  04/07/2016  CLINICAL DATA:  Respiratory failure, shortness of breath, sepsis, history of acute and chronic CHF, acute and chronic renal insufficiency, diabetes. EXAM: PORTABLE CHEST 1 VIEW COMPARISON:  Portable chest x-ray of Apr 06, 2016 FINDINGS: The lungs are well-expanded. There are bilateral pleural effusions layering posteriorly. The hemidiaphragms are now  partially obscured. The heart is top-normal in size. The central pulmonary vascularity is prominent. The endotracheal tube tip lies 3.8 cm above the carina. The esophagogastric tube tip projects below the inferior margin of the image. The right internal jugular Cordis sheath or dialysis catheter tip projects over the proximal SVC. The small caliber left internal jugular venous catheter tip projects over the midportion of the SVC. IMPRESSION: Some overall worsening of pulmonary interstitial edema with development of bilateral pleural effusions layering posteriorly. The support tubes are in stable position. Electronically Signed   By: David  Martinique M.D.   On: 04/07/2016 07:54   Dg Chest Port 1 View  04/06/2016  CLINICAL DATA:  Central line placement EXAM: PORTABLE CHEST 1 VIEW COMPARISON:  Chest x-rays from earlier same day and 04/05/2016. FINDINGS: Status post left IJ central line placement with tip adequately positioned at the level of the upper SVC. Right IJ catheter stable in position. Endotracheal tube remains well positioned with tip just above the level of the carina. Enteric tube passes below the diaphragm. Mild cardiomegaly is stable. Central pulmonary vascular congestion again noted. There is mild bilateral interstitial edema, improved compared to the chest x-ray from earlier today. No pleural effusion or pneumothorax seen. IMPRESSION: 1. New left IJ central line in place with tip adequately positioned at the level of the upper SVC. Could consider advancing for more optimal radiographic positioning. 2. Remainder of the support apparatus appears stable in position. 3. Improved aeration within each lung suggesting decreased edema (improved fluid status). Central pulmonary vascular congestion and mild perihilar edema persists. 4. No pneumothorax. Electronically Signed   By: Franki Cabot M.D.   On: 04/06/2016 20:56   Dg Chest Port 1 View  04/06/2016  CLINICAL DATA:  Status post central line placement  EXAM: PORTABLE CHEST 1 VIEW COMPARISON:  04/05/2016 FINDINGS: Cardiac shadow is stable. Persistent bilateral lower lung opacities. A new nasogastric catheter and endotracheal tube are again seen and stable. A new right jugular catheter is noted. No pneumothorax is seen. Catheter tip is noted in the proximal superior vena cava. IMPRESSION: No pneumothorax following central line placement. Stable bilateral lower lung opacities. Electronically Signed   By: Inez Catalina M.D.   On: 04/06/2016 11:14   Portable Chest Xray  04/05/2016  CLINICAL DATA:  Endotracheal tube placement.  Initial encounter. EXAM: PORTABLE CHEST 1 VIEW COMPARISON:  Chest radiograph performed 04/03/2016 FINDINGS: The patient's endotracheal tube is seen ending 3-4 cm above the carina. An enteric tube is noted extending below the diaphragm. Worsening bilateral lower lung zone airspace opacification raises concern for significantly worsening multifocal pneumonia, though pulmonary edema might have a similar appearance. No definite pleural effusion or pneumothorax is seen. The cardiomediastinal silhouette is normal in size. No acute osseous abnormalities are identified. IMPRESSION: 1. Endotracheal tube seen ending 3-4 cm above the carina. 2. Worsening bilateral lower lung zone airspace opacification is concerning for significantly worsened multifocal pneumonia, though pulmonary edema might have a similar appearance. Electronically Signed   By: Garald Balding M.D.   On: 04/05/2016 18:30   MPI on 07/17/2015 which showed intermediate risk stress nuclear study with a large, severe, partially reversible defect in the mid/distal anterior wall, distal inferior wall and apex; findings consistent with prior infarct and mild to moderate peri-infarct ischemia; EF 46 with apical hypokinesis and mild LVE.   Echo: 09/29/2015 - Left ventricle: The cavity size was normal. Wall thickness was  normal. The estimated ejection fraction was 45%. Mid  anteroseptal,  apical septal, apical inferior, apical anterior,  and true apex severe hypokinesis. Features are consistent with a  pseudonormal left ventricular filling pattern, with concomitant  abnormal relaxation and increased filling pressure (grade 2  diastolic dysfunction). E/medial e&' > 15 suggests LV end  diastolic pressure at least 20 mmHg. - Aortic valve: There was no stenosis. There was trivial  regurgitation. - Mitral valve: There was moderate regurgitation. Effective  regurgitant orifice (PISA): 0.28 cm^2. - Left atrium: The atrium was mildly dilated. - Right ventricle: The cavity size was normal. Systolic function  was normal. - Tricuspid valve: There was moderate regurgitation. Peak RV-RA  gradient (S): 48 mm Hg. - Pulmonary arteries: PA peak pressure: 51 mm Hg (S). - Inferior vena cava: The vessel was normal in size. The  respirophasic diameter changes were in the normal range (>= 50%),  consistent with normal central venous pressure.  ASSESSMENT/PLAN:   Troy Young is a 85 M with PMHx of ICM (EF 45% on stress test), HTN, IDDM with CKD Stage IV who presented to the ED on 5/29 with acute hypoxemic respiratory failure requiring intubation, NSTEMI, and acute on chronic renal failure now on CRRT.  Acute on Chronic HFrEF: EF 40-45% on TTE on 04/06/16 with akinesis of mid-apical anteroseptal and anterolateral myocardium and moderate mitral valve regurgitation, moderate tricuspid regurgitation and pulmonary hypertension. Given poor UOP with lasix alone and worsening renal failure, patient was started on CRRT yesterday. -2,905 mL of urine out yesterday. CXR this morning showed worsening pulmonary edema with b/l pleural effusions posteriorly.  -Daily weights -Strict I/Os -Lasix 160 mg IV Q6H -Holding BB due to hypotension -On CRRT  NSTEMI: Likely secondary to demand ischemia from underlying CAD in the setting of hypotension and heart failure. Myoview in September 2016 showed  intermediate risk stress nuclear study with a large, severe, partially reversible defect in the mid/distal anterior wall, distal inferior wall and apex; findings consistent with prior infarct and mild to moderate peri-infarct ischemia. -Will stop Heparin gtt due to dropping hemoglobin in the setting of bloody urine -ASA 81 mg daily -Atorvastatin 80 mg daily  Acute on Chronic CKD IV: Baseline creatinine 3.5 with baseline GFR in the 20s. Given worsening renal function, oliguria, and acidemia, patient was started on CRRT yesterday.  -On CRRT -On Lasix 160  mg Q6H -Nephrology following -Follow kidney function  IDDM: Well controlled, HgbA1c 6.3 on admission. -On SSI and basal insulin  Possible Septic Shock: Possibly due to pneumonia. Patient has been hypotensive and hypothermic. WBC 15>25>12 this morning. -Per primary -On Vanc/Doxycycline -Intubated and sedated -On pressors  Martyn Malay, DO PGY-2 Internal Medicine Resident Pager # [phone] 04/07/2016 9:05 AM     I have seen and examined the patient along with Coulter, DO.  I have reviewed the chart, notes and new data.  I agree with her note.  Key new complaints: intubated and sedated Key examination changes: hypothermia, remains pressor dependent; gross hematuria persists Key new findings / data: improved metabolic parameters on CRRT; echo shows only mildly depressed LVEF with expected wall motion abnormalities in the previous territory of LAD artery ischemia/infarction described on his last nuclear stress test  PLAN: Findings are consistent with demand ischemia due to sepsis/hypotension in a patient with known CAD and ischemic cardiomyopathy. Will stop heparin due to hematuria and falling hemoglobin, since it appears less likely he has a true acute coronary syndrome. Continue supportive care. Prognosis remains poor due to multi system organ failure.  Sanda Klein, MD, [place] [phone] 04/07/2016, 9:21  AM

## 2016-04-08 DIAGNOSIS — J690 Pneumonitis due to inhalation of food and vomit: Secondary | ICD-10-CM | POA: Insufficient documentation

## 2016-04-08 DIAGNOSIS — I214 Non-ST elevation (NSTEMI) myocardial infarction: Secondary | ICD-10-CM

## 2016-04-08 LAB — POCT I-STAT 3, ART BLOOD GAS (G3+)
ACID-BASE EXCESS: 1 mmol/L (ref 0.0–2.0)
BICARBONATE: 27.6 meq/L — AB (ref 20.0–24.0)
O2 Saturation: 87 %
PH ART: 7.314 — AB (ref 7.350–7.450)
TCO2: 29 mmol/L (ref 0–100)
pCO2 arterial: 54.5 mmHg — ABNORMAL HIGH (ref 35.0–45.0)
pO2, Arterial: 60 mmHg — ABNORMAL LOW (ref 80.0–100.0)

## 2016-04-08 LAB — GLUCOSE, CAPILLARY
GLUCOSE-CAPILLARY: 139 mg/dL — AB (ref 65–99)
GLUCOSE-CAPILLARY: 142 mg/dL — AB (ref 65–99)
GLUCOSE-CAPILLARY: 148 mg/dL — AB (ref 65–99)
Glucose-Capillary: 136 mg/dL — ABNORMAL HIGH (ref 65–99)
Glucose-Capillary: 93 mg/dL (ref 65–99)
Glucose-Capillary: 126 mg/dL — ABNORMAL HIGH (ref 65–99)

## 2016-04-08 LAB — CBC
HEMATOCRIT: 28.5 % — AB (ref 39.0–52.0)
Hemoglobin: 8.9 g/dL — ABNORMAL LOW (ref 13.0–17.0)
MCH: 30.1 pg (ref 26.0–34.0)
MCHC: 31.2 g/dL (ref 30.0–36.0)
MCV: 96.3 fL (ref 78.0–100.0)
PLATELETS: 164 10*3/uL (ref 150–400)
RBC: 2.96 MIL/uL — AB (ref 4.22–5.81)
RDW: 14.2 % (ref 11.5–15.5)
WBC: 14.4 10*3/uL — AB (ref 4.0–10.5)

## 2016-04-08 LAB — CULTURE, RESPIRATORY: CULTURE: NO GROWTH

## 2016-04-08 LAB — RENAL FUNCTION PANEL
ALBUMIN: 2.3 g/dL — AB (ref 3.5–5.0)
Albumin: 2.2 g/dL — ABNORMAL LOW (ref 3.5–5.0)
Anion gap: 7 (ref 5–15)
Anion gap: 9 (ref 5–15)
BUN: 11 mg/dL (ref 6–20)
BUN: 9 mg/dL (ref 6–20)
CALCIUM: 7.8 mg/dL — AB (ref 8.9–10.3)
CHLORIDE: 103 mmol/L (ref 101–111)
CO2: 26 mmol/L (ref 22–32)
CO2: 27 mmol/L (ref 22–32)
CREATININE: 1.46 mg/dL — AB (ref 0.61–1.24)
CREATININE: 1.67 mg/dL — AB (ref 0.61–1.24)
Calcium: 8 mg/dL — ABNORMAL LOW (ref 8.9–10.3)
Chloride: 101 mmol/L (ref 101–111)
GFR calc Af Amer: 48 mL/min — ABNORMAL LOW (ref >60)
GFR, EST AFRICAN AMERICAN: 56 mL/min — AB (ref >60)
GFR, EST NON AFRICAN AMERICAN: 48 mL/min — AB (ref >60)
GFR, EST NON AFRICAN AMERICAN: 41 mL/min — AB (ref >60)
GLUCOSE: 106 mg/dL — AB (ref 65–99)
Glucose, Bld: 146 mg/dL — ABNORMAL HIGH (ref 65–99)
PHOSPHORUS: 4.2 mg/dL (ref 2.5–4.6)
Phosphorus: 2.4 mg/dL — ABNORMAL LOW (ref 2.5–4.6)
Potassium: 4.3 mmol/L (ref 3.5–5.1)
Potassium: 4.3 mmol/L (ref 3.5–5.1)
SODIUM: 136 mmol/L (ref 135–145)
SODIUM: 137 mmol/L (ref 135–145)

## 2016-04-08 LAB — APTT

## 2016-04-08 LAB — POCT ACTIVATED CLOTTING TIME
ACTIVATED CLOTTING TIME: 188 s
ACTIVATED CLOTTING TIME: 204 s
ACTIVATED CLOTTING TIME: 209 s
ACTIVATED CLOTTING TIME: 183 s
ACTIVATED CLOTTING TIME: 204 s
ACTIVATED CLOTTING TIME: 219 s
Activated Clotting Time: 188 seconds
Activated Clotting Time: 193 seconds
Activated Clotting Time: 198 seconds
Activated Clotting Time: 173 seconds
Activated Clotting Time: 178 seconds
Activated Clotting Time: 188 seconds
Activated Clotting Time: 203 seconds
Activated Clotting Time: 214 seconds

## 2016-04-08 LAB — CULTURE, RESPIRATORY W GRAM STAIN

## 2016-04-08 LAB — ROCKY MTN SPOTTED FVR ABS PNL(IGG+IGM)
RMSF IGG: NEGATIVE
RMSF IgM: 0.31 index (ref 0.00–0.89)

## 2016-04-08 LAB — MAGNESIUM: MAGNESIUM: 2.2 mg/dL (ref 1.7–2.4)

## 2016-04-08 MED ORDER — VITAL HIGH PROTEIN PO LIQD: 1000.0000 mL | ORAL | Status: DC

## 2016-04-08 MED ORDER — VITAL 1.5 CAL PO LIQD
1000.0000 mL | ORAL | Status: DC
  Administered 2016-04-08 – 2016-04-09 (×2): 1000 mL
  Filled 2016-04-08 (×4): qty 1000

## 2016-04-08 MED ORDER — PRO-STAT SUGAR FREE PO LIQD
60.0000 mL | Freq: Three times a day (TID) | ORAL | Status: DC
  Administered 2016-04-08 – 2016-04-09 (×5): 60 mL
  Filled 2016-04-08 (×6): qty 60

## 2016-04-08 MED ORDER — SODIUM PHOSPHATES 45 MMOLE/15ML IV SOLN
30.0000 mmol | Freq: Once | INTRAVENOUS | Status: AC
  Administered 2016-04-08: 30 mmol via INTRAVENOUS
  Filled 2016-04-08: qty 10

## 2016-04-08 MED ORDER — SODIUM CHLORIDE 0.9 % IV SOLN
3.0000 g | Freq: Four times a day (QID) | INTRAVENOUS | Status: AC
  Administered 2016-04-08 – 2016-04-10 (×10): 3 g via INTRAVENOUS
  Filled 2016-04-08 (×10): qty 3

## 2016-04-08 MED ORDER — ASPIRIN 81 MG PO CHEW
81.0000 mg | CHEWABLE_TABLET | Freq: Every day | ORAL | Status: DC
  Administered 2016-04-08 – 2016-04-09 (×2): 81 mg via ORAL
  Filled 2016-04-08 (×3): qty 1

## 2016-04-08 NOTE — Progress Notes (Signed)
PULMONARY / CRITICAL CARE MEDICINE   Name: Rogie Milum MRN: [id number] DOB: 06/29/1951    ADMISSION DATE:  03/11/2016  CHIEF COMPLAINT:  Difficulty Breathing  HISTORY OF PRESENT ILLNESS:   Mr. Pitones is an 65 y.o. 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 He states that his symptoms began on Friday after he had food from [employer]. He has also complained of generalized edema, and is up about 15 lbs. In the ED, he was found to have a low-grade fever, and was treated for presumed sepsis with multiple liters of IVF, and his lactic acid rose from mid-2s to mid-4s. He denies subjective fevers, chills, etc. 5/29 required intubation and 100% fio2 with peep 14.   SUBJECTIVE: Less Fio2. Paralyzed, sedated.  Tolerating CVVH. No other issues.  Had hematuria. Heparin drip dc'd.   VITAL SIGNS: BP 114/59 mmHg  Pulse 75  Temp(Src) 93.4 F (34.1 C) (Core (Comment))  Resp 20  Ht 5\' 8"  (1.727 m)  Wt 97.7 kg (215 lb 6.2 oz)  BMI 32.76 kg/m2  SpO2 99%  HEMODYNAMICS: CVP:  [6 mmHg-16 mmHg] 8 mmHg  VENTILATOR SETTINGS: Vent Mode:  [-] PRVC FiO2 (%):  [30 %-50 %] 30 % Set Rate:  [20 bmp] 20 bmp Vt Set:  [410 mL] 410 mL PEEP:  [8 cmH20-10 cmH20] 8 cmH20 Plateau Pressure:  [18 cmH20-22 cmH20] 21 cmH20  INTAKE / OUTPUT: I/O last 3 completed shifts: In: 6814.1 [I.V.:4421.5; Other:40; NG/GT:50; IV Piggyback:2302.7] Out: 7755 [Urine:3030; Other:4725]  PHYSICAL EXAMINATION: General:  Elderly man intubated, sedated , on pressors. paralyzed Neuro:  Blind, sedated heavily HEENT:  MMM, ott->vent Cardiovascular:  Heart sounds rrr Lungs:  Bibasilar crackles Abdomen:  Firm, but nontender, OGT to LWIS Musculoskeletal:  Gr 1 edema > less now Skin:  No visible rashes  LABS:  BMET  Recent Labs Lab 04/07/16 0355 04/07/16 0756 04/07/16 1600  04/08/16 0350  NA 136 140 133* 136  K 3.4* 3.7 4.3 4.3  CL 105 101 101 103  CO2 21*  --  23 26  BUN 32* 26* 18 11  CREATININE 2.41* 2.20* 1.86* 1.46*  GLUCOSE 131* 142* 197* 146*  Procal>>6.5  Electrolytes  Recent Labs Lab 04/06/16 0227  04/07/16 0355 04/07/16 1555 04/07/16 1600 04/08/16 0350  CALCIUM 7.7*  < > 7.8*  --  7.2* 7.8*  MG 2.0  --  2.0  --   --  2.2  PHOS 5.1*  < > 1.6* 3.6 3.6 2.4*  < > = values in this interval not displayed.  CBC  Recent Labs Lab 04/05/16 0440 04/06/16 0227 04/07/16 0355 04/07/16 0756  WBC 15.6* 25.8* 12.3*  --   HGB 9.6* 9.8* 8.2* 8.8*  HCT 31.5* 32.7* 25.7* 26.0*  PLT 181 175 147*  --     Coag's  Recent Labs Lab 04/05/16 0440 04/07/16 0355 04/08/16 0350  APTT >200* >200* >200*  INR 1.81* 2.19*  --     Sepsis Markers  Recent Labs Lab 04/05/16 0103 04/05/16 0448 04/05/16 1600 04/06/16 0227 04/06/16 1305 04/07/16 0355  LATICACIDVEN 4.22* 4.2*  --   --  1.6  --   PROCALCITON  --   --  3.96 6.50  --  3.06    ABG  Recent Labs Lab 04/07/16 0342 04/07/16 0438 04/07/16 0902  PHART 7.687* 7.575* 7.432  PCO2ART 16.3* 25.3* 35.7  PO2ART 50.0*  130.0* 72.0*    Liver Enzymes  Recent Labs Lab 03/11/2016 2038 04/05/16 0440  04/07/16 0355 04/07/16 1600 04/08/16 0350  AST 27 42*  --   --   --   --   ALT 17 32  --   --   --   --   ALKPHOS 70 61  --   --   --   --   BILITOT 1.0 1.2  --   --   --   --   ALBUMIN 3.3* 2.9*  < > 2.2* 2.1* 2.2*  < > = values in this interval not displayed.  Cardiac Enzymes  Recent Labs Lab 04/05/16 0440 04/05/16 0809 04/05/16 1320  TROPONINI 1.33* 2.00* 6.41*    Glucose  Recent Labs Lab 04/07/16 0405 04/07/16 0753 04/07/16 1137 04/07/16 1535 04/07/16 1939 04/08/16 0002  GLUCAP 136* 155* 178* 197* 141* 139*    Imaging US Renal  04/07/2016  CLINICAL DATA:  Acute kidney injury. History of renal insufficiency and hypertension as well diabetes. EXAM: RENAL / URINARY  TRACT ULTRASOUND COMPLETE COMPARISON:  Renal ultrasound, 06/11/2015 FINDINGS: Right Kidney: Length: 10.4 cm. Increased parenchymal echogenicity. No renal mass or stone. No hydronephrosis. Left Kidney: Length: 10.9 cm. Increased parenchymal echogenicity. No renal mass or stone. No hydronephrosis. Bladder: Decompressed with a Foley catheter. Note is made of a left pleural effusion. IMPRESSION: 1. No hydronephrosis.  No acute finding. 2. Increased renal parenchymal echogenicity suggesting medical renal disease. Electronically Signed   By: Lajean Manes M.D.   On: 04/07/2016 10:54     STUDIES:  5/29 2 d >>  CULTURES: Blood 5/29 (pending)>> (-) Urine 5/29 (pending)>> (-) Sputum 5/29>> (-)  ANTIBIOTICS: Ceftriaxone x1 dose 5/29 >> Vanc 5/29 >> Pip-tazo 5/29 >>  SIGNIFICANT EVENTS: Bolused 3L in ED 5/29 intubated 5/30 worsening renal failure, on pressors, hypekalemia  LINES/TUBES: PIV 5/29 OTT>> 5/30 rt rad a line>> 5/30 rt i j HD cath>> 5/30 L TLC >>  DISCUSSION: 65 y/o man with Acute exacerbation of heart failure.  ASSESSMENT / PLAN:  PULMONARY A: Acute Hypoxemic Resp Failure due to Pulmonary Edema, Demand Ischemia, R/O G(-) HCAP or MRSA (-) Likely ARDS (although predominant picture now is more of failure) Aspiration PNA P:   Vent bundle > will start cutting down sedation and do PST when awake.  Abx pending culture > will deescalate to unasyn if wbc is not worse today.  Treat heart failure  CARDIOVASCULAR A:  Acute exacerbation of ischemic systolic cardiomyopathy NSTEMI  H/O HTN P:  Heparin infusion was dc'd 2/2 bleeding/anemia Cards consulted and following Echo EF 40-45% Cont cvvh BP meds on hold 2/2 hypotension  RENAL Lab Results  Component Value Date   CREATININE 1.46* 04/08/2016   CREATININE 1.86* 04/07/2016   CREATININE 2.20* 04/07/2016   CREATININE 3.60* 11/10/2015  \  Recent Labs Lab 04/07/16 0756 04/07/16 1600 04/08/16 0350  K 3.7 4.3 4.3      A:   Acute on chronic kidney injury CKD 4 (baseline) Hyperkalemia P:   Cont CVVH On lasix pushes Renal consulted Correct electrolytes  GASTROINTESTINAL A:   No active issues P:   Start TF once off paralytics ppi   HEMATOLOGIC A:   Anemia, bleeding, hematuria P: Getting 1 u pRBC. Heparin dc'd 2/2 anemia/bleeding  INFECTIOUS A:   Likely asp pna.  P:   Cont vanc and zosyn and doxy for now > will shift to unasyn if wbc is not worse today. Cultures have been (-).  ENDOCRINE CBG (last 3)   Recent Labs  04/07/16 1535 04/07/16 1939 04/08/16 0002  GLUCAP 197* 141* 139*    A:   DM2  P:   Cont with SSI + basal (8 U nightly of lantus)  NEUROLOGIC A:   Blind  P:   Dec versed drip. Cont fentanyl drip. On paralytics > d/c paralytics today.    FAMILY  - Updates: No family at bedside.  - Inter-disciplinary family meet or Palliative Care meeting due by:  June 5  [place] time today spent on this pt today was 35  mins.   Monica Becton, MD 04/08/2016, 7:59 AM [facility] Pager [phone] After 3 pm or if no answer, call [phone]

## 2016-04-08 NOTE — Progress Notes (Signed)
Patient Name: Troy Young Date of Encounter: 04/08/2016  Active Problems:   DM2 (diabetes mellitus, type 2) ([place])   HTN (hypertension)   CKD stage 4 due to type 2 diabetes mellitus (HCC)   Elevated troponin   Dyspnea   HLD (hyperlipidemia)   BPH (benign prostatic hyperplasia)   Sepsis ([place])   Type 2 diabetes mellitus with diabetic nephropathy (HCC)   Blindness, legal   Acute on chronic systolic heart failure, NYHA class 4 (HCC)   Leukocytosis   Wheezing   Heart failure (HCC)   Acute respiratory failure with hypoxia (HCC)   AKI (acute kidney injury) ([place])   Aspiration pneumonia ([place])   Length of Stay: 4  SUBJECTIVE  Remains intubated, sedated, but paralytics are off. Still on phenylephrine, lower dose. Only small net negative fluid balance last 24 h, calculated to still be +2.5L on admission (and estimated to be "wet" on admission). Still has decent UO. Only on FiO2 0.3 and PEEP 8. Remains hypothermic, probably due to CRRT (warmed up during CRRT interruption). Hematuria improving.  CURRENT MEDS . antiseptic oral rinse  7 mL Mouth Rinse 10 times per day  . artificial tears  1 application Both Eyes D7A  . aspirin EC  81 mg Oral Daily  . atorvastatin  80 mg Oral q1800  . chlorhexidine gluconate (SAGE KIT)  15 mL Mouth Rinse BID  . Chlorhexidine Gluconate Cloth  6 each Topical Q0600  . darbepoetin (ARANESP) injection - DIALYSIS  200 mcg Intravenous Q Wed-HD  . doxycycline (VIBRAMYCIN) IV  100 mg Intravenous Q12H  . ferumoxytol  510 mg Intravenous Weekly  . insulin aspart  0-15 Units Subcutaneous Q4H  . insulin glargine  8 Units Subcutaneous QHS  . mupirocin ointment  1 application Nasal BID  . pantoprazole sodium  40 mg Per Tube Daily  . piperacillin-tazobactam (ZOSYN)  IV  2.25 g Intravenous Q6H  . sodium chloride flush  10-40 mL Intracatheter Q12H  . sodium chloride flush  3 mL Intravenous Q12H  . sodium phosphate  Dextrose 5% IVPB  30 mmol Intravenous Once  .  vancomycin  1,000 mg Intravenous Q24H    OBJECTIVE   Intake/Output Summary (Last 24 hours) at 04/08/16 0833 Last data filed at 04/08/16 0800  Gross per 24 hour  Intake 4365.53 ml  Output   5088 ml  Net -722.47 ml   Filed Weights   04/06/16 0400 04/07/16 0248 04/08/16 0343  Weight: 89.9 kg (198 lb 3.1 oz) 89.9 kg (198 lb 3.1 oz) 97.7 kg (215 lb 6.2 oz)    PHYSICAL EXAM Filed Vitals:   04/08/16 0420 04/08/16 0500 04/08/16 0600 04/08/16 0745  BP: 111/59  81/57 114/59  Pulse: 74 73 70 75  Temp:  93.6 F (34.2 C) 93.4 F (34.1 C)   TempSrc:      Resp:  _0 Weight:      SpO2: 100% 99% 99% 99%   General: sedated Head: intubated Neck: mildly elevated jugular venous pulsations and +ve hepatojugular reflux; brisk carotid pulses without delay and no carotid bruits Chest: clear to auscultation, no signs of consolidation by percussion or palpation, normal fremitus, symmetrical and full respiratory excursions Cardiovascular: normal position and quality of the apical impulse, regular rhythm, normal first and second heart sounds, no rubs or gallops, no murmur Abdomen: no tenderness or distention, no masses by palpation, no abnormal pulsatility or arterial bruits, hypoactive bowel sounds, no hepatosplenomegaly Extremities: no clubbing, cyanosis or edema;  Neurological: unable to  assess  LABS  CBC  Recent Labs  04/06/16 0227 04/07/16 0355 04/07/16 0756  WBC 25.8* 12.3*  --   NEUTROABS 21.7*  --   --   HGB 9.8* 8.2* 8.8*  HCT 32.7* 25.7* 26.0*  MCV 97.9 91.5  --   PLT 175 147*  --    Basic Metabolic Panel  Recent Labs  04/07/16 0355  04/07/16 1600 04/08/16 0350  NA 136  < > 133* 136  K 3.4*  < > 4.3 4.3  CL 105  < > 101 103  CO2 21*  --  23 26  GLUCOSE 131*  < > 197* 146*  BUN 32*  < > 18 11  CREATININE 2.41*  < > 1.86* 1.46*  CALCIUM 7.8*  --  7.2* 7.8*  MG 2.0  --   --  2.2  PHOS 1.6*  < > 3.6 2.4*  < > = values in this interval not displayed. Liver  Function Tests  Recent Labs  04/07/16 1600 04/08/16 0350  ALBUMIN 2.1* 2.2*   No results for input(s): LIPASE, AMYLASE in the last 72 hours. Cardiac Enzymes  Recent Labs  04/05/16 1320  TROPONINI 6.41*    Radiology Studies Imaging results have been reviewed and Us Renal  04/07/2016  CLINICAL DATA:  Acute kidney injury. History of renal insufficiency and hypertension as well diabetes. EXAM: RENAL / URINARY TRACT ULTRASOUND COMPLETE COMPARISON:  Renal ultrasound, 06/11/2015 FINDINGS: Right Kidney: Length: 10.4 cm. Increased parenchymal echogenicity. No renal mass or stone. No hydronephrosis. Left Kidney: Length: 10.9 cm. Increased parenchymal echogenicity. No renal mass or stone. No hydronephrosis. Bladder: Decompressed with a Foley catheter. Note is made of a left pleural effusion. IMPRESSION: 1. No hydronephrosis.  No acute finding. 2. Increased renal parenchymal echogenicity suggesting medical renal disease. Electronically Signed   By: David  Ormond M.D.   On: 04/07/2016 10:54   Dg Chest Port 1 View  04/07/2016  CLINICAL DATA:  Respiratory failure, shortness of breath, sepsis, history of acute and chronic CHF, acute and chronic renal insufficiency, diabetes. EXAM: PORTABLE CHEST 1 VIEW COMPARISON:  Portable chest x-ray of Apr 06, 2016 FINDINGS: The lungs are well-expanded. There are bilateral pleural effusions layering posteriorly. The hemidiaphragms are now partially obscured. The heart is top-normal in size. The central pulmonary vascularity is prominent. The endotracheal tube tip lies 3.8 cm above the carina. The esophagogastric tube tip projects below the inferior margin of the image. The right internal jugular Cordis sheath or dialysis catheter tip projects over the proximal SVC. The small caliber left internal jugular venous catheter tip projects over the midportion of the SVC. IMPRESSION: Some overall worsening of pulmonary interstitial edema with development of bilateral pleural  effusions layering posteriorly. The support tubes are in stable position. Electronically Signed   By: David  Jordan M.D.   On: 04/07/2016 07:54   Dg Chest Port 1 View  04/06/2016  CLINICAL DATA:  Central line placement EXAM: PORTABLE CHEST 1 VIEW COMPARISON:  Chest x-rays from earlier same day and 04/05/2016. FINDINGS: Status post left IJ central line placement with tip adequately positioned at the level of the upper SVC. Right IJ catheter stable in position. Endotracheal tube remains well positioned with tip just above the level of the carina. Enteric tube passes below the diaphragm. Mild cardiomegaly is stable. Central pulmonary vascular congestion again noted. There is mild bilateral interstitial edema, improved compared to the chest x-ray from earlier today. No pleural effusion or pneumothorax seen. IMPRESSION: 1. New left IJ   central line in place with tip adequately positioned at the level of the upper SVC. Could consider advancing for more optimal radiographic positioning. 2. Remainder of the support apparatus appears stable in position. 3. Improved aeration within each lung suggesting decreased edema (improved fluid status). Central pulmonary vascular congestion and mild perihilar edema persists. 4. No pneumothorax. Electronically Signed   By: Stan  Maynard M.D.   On: 04/06/2016 20:56   Dg Chest Port 1 View  04/06/2016  CLINICAL DATA:  Status post central line placement EXAM: PORTABLE CHEST 1 VIEW COMPARISON:  04/05/2016 FINDINGS: Cardiac shadow is stable. Persistent bilateral lower lung opacities. A new nasogastric catheter and endotracheal tube are again seen and stable. A new right jugular catheter is noted. No pneumothorax is seen. Catheter tip is noted in the proximal superior vena cava. IMPRESSION: No pneumothorax following central line placement. Stable bilateral lower lung opacities. Electronically Signed   By: Mark  Lukens M.D.   On: 04/06/2016 11:14    TELE NSR  ECG 05/31 Normal sinus  rhythm; Anteroseptal infarct, age undetermined; Prolonged QT (QTc 506 ms) the anterior lateral ST depression has improved since previous ECG  ASSESSMENT AND PLAN  Acute on Chronic systolic and diastolic heart failure: EF 40-45% on TTE on 04/06/16 with akinesis of mid-apical anteroseptal and anterolateral myocardium and moderate mitral valve regurgitation, moderate tricuspid regurgitation and pulmonary hypertension.  CXR yesterday showed worsening pulmonary edema with b/l pleural effusions posteriorly. No CXR today.Probably still 3-4L overloaded. -Daily weights -Strict I/Os -Lasix 160 mg IV Q6H -On CRRT  CAD s/p NSTEMI: Likely secondary to demand ischemia from underlying CAD in the setting of hypotension and heart failure. Myoview in September 2016 showed intermediate risk stress nuclear study with a large, severe, partially reversible defect in the mid/distal anterior wall, distal inferior wall and apex; findings consistent with prior infarct and mild to moderate peri-infarct ischemia. Echo this admission shows wall motion abnormality in the same distribution. -Off Heparin gtt due to dropping hemoglobin in the setting of bloody urine. -ASA 81 mg daily -Atorvastatin 80 mg daily  Acute nonoliguric renal failure on Chronic CKD IV: Baseline creatinine 3.5 with baseline GFR in the 20s. Given worsening renal function, oliguria, and acidemia, patient was started on CRRT. Unlikely to show major improvement until we can wean pressors.  IDDM: Well controlled, HgbA1c 6.3 on admission.  Possible Septic Shock: Possibly due to pneumonia. Patient has been hypotensive and hypothermic. WBC steadily improving this morning.On Vanc/Doxycycline. Intubated and sedated, no longer on paralytics, now with greatly improved O2 requirements. Wean pressors as BP allows.   Mihai Croitoru, MD, FACC [facility] [phone] office [phone] pager 04/08/2016 8:33 AM   

## 2016-04-08 NOTE — Progress Notes (Signed)
Nutrition Follow-up  DOCUMENTATION CODES:   Not applicable  INTERVENTION:   Vital 1.5 @ 35 ml/hr 60 ml Prostat TID Provides: 1860 kcal (97% of needs), 146 grams protein, and 641 ml H2O.   NUTRITION DIAGNOSIS:   Increased nutrient needs related to catabolic illness as evidenced by estimated needs. Ongoing.   GOAL:   Patient will meet greater than or equal to 90% of their needs Progressing.   MONITOR:   Vent status, I & O's, Labs  REASON FOR ASSESSMENT:   Consult Enteral/tube feeding initiation and management  ASSESSMENT:   PMHx of ICM (EF 45% on stress test), HTN, IDDM with CKD Stage IV who presented to the ED on 5/29 with acute hypoxemic respiratory failure requiring intubation, NSTEMI, and acute on chronic renal failure now on CRRT.  5/29 intubated, paralyzed 5/30 CRRT  Patient is currently intubated on ventilator support MV: 10.2 L/min Temp (24hrs), Avg:94.6 F (34.8 C), Min:93.4 F (34.1 C), Max:98.8 F (37.1 C)  Labs reviewed: phosphorus low 2.4 OG tube MAP better today [id number] Diastolic BP [id number]  Diet Order:  Diet NPO time specified  Skin:  Reviewed, no issues  Last BM:  6/1  Height:   Ht Readings from Last 1 Encounters:  11/10/15 5' 7.5" (1.715 m)    Weight:   Wt Readings from Last 1 Encounters:  04/08/16 215 lb 6.2 oz (97.7 kg)  admission weight 85.2 kg (188 lb )  Ideal Body Weight:  70 kg  BMI:  Body mass index is 32.76 kg/(m^2).  Estimated Nutritional Needs:   Kcal:  1922  Protein:  123-140 grams  Fluid:  > 1.9 L/day  EDUCATION NEEDS:   No education needs identified at this time  [place], [place], [place] Pager [phone] After Hours Pager

## 2016-04-08 NOTE — Progress Notes (Signed)
Subjective: Interval History: paralyzed on vent.  Objective: Vital signs in last 24 hours: Temp:  [93.4 F (34.1 C)-94.1 F (34.5 C)] 93.4 F (34.1 C) (06/01 0600) Pulse Rate:  [70-79] 70 (06/01 0600) Resp:  [17-20] 20 (06/01 0600) BP: (75-111)/(48-67) 81/57 mmHg (06/01 0600) SpO2:  [95 %-100 %] 99 % (06/01 0600) Arterial Line BP: (81-117)/(42-64) 97/54 mmHg (06/01 0600) FiO2 (%):  [40 %-50 %] 40 % (06/01 0420) Weight:  [97.7 kg (215 lb 6.2 oz)] 97.7 kg (215 lb 6.2 oz) (06/01 0343) Weight change: 7.8 kg (17 lb 3.1 oz)  Intake/Output from previous day: 05/31 0701 - 06/01 0700 In: 4570 [I.V.:2799.4; NG/GT:50; IV Piggyback:1720.7] Out: 5201 [Urine:1280] Intake/Output this shift:    General appearance: sedated, paralyzed on vent Neck: RIJ cath Resp: rales bibasilar Cardio: S1, S2 normal and systolic murmur: holosystolic 2/6, blowing at apex GI: mod distension, few bs, tight Extremities: edema 2+  Lab Results:  Recent Labs  04/06/16 0227 04/07/16 0355 04/07/16 0756  WBC 25.8* 12.3*  --   HGB 9.8* 8.2* 8.8*  HCT 32.7* 25.7* 26.0*  PLT 175 147*  --    BMET:  Recent Labs  04/07/16 1600 04/08/16 0350  NA 133* 136  K 4.3 4.3  CL 101 103  CO2 23 26  GLUCOSE 197* 146*  BUN 18 11  CREATININE 1.86* 1.46*  CALCIUM 7.2* 7.8*    Recent Labs  04/06/16 1305  PTH 341*   Iron Studies:  Recent Labs  04/06/16 1305  IRON 10*  TIBC 244*    Studies/Results: US Renal  04/07/2016  CLINICAL DATA:  Acute kidney injury. History of renal insufficiency and hypertension as well diabetes. EXAM: RENAL / URINARY TRACT ULTRASOUND COMPLETE COMPARISON:  Renal ultrasound, 06/11/2015 FINDINGS: Right Kidney: Length: 10.4 cm. Increased parenchymal echogenicity. No renal mass or stone. No hydronephrosis. Left Kidney: Length: 10.9 cm. Increased parenchymal echogenicity. No renal mass or stone. No hydronephrosis. Bladder: Decompressed with a Foley catheter. Note is made of a left pleural  effusion. IMPRESSION: 1. No hydronephrosis.  No acute finding. 2. Increased renal parenchymal echogenicity suggesting medical renal disease. Electronically Signed   By: Lajean Manes M.D.   On: 04/07/2016 10:54   Dg Chest Port 1 View  04/07/2016  CLINICAL DATA:  Respiratory failure, shortness of breath, sepsis, history of acute and chronic CHF, acute and chronic renal insufficiency, diabetes. EXAM: PORTABLE CHEST 1 VIEW COMPARISON:  Portable chest x-ray of Apr 06, 2016 FINDINGS: The lungs are well-expanded. There are bilateral pleural effusions layering posteriorly. The hemidiaphragms are now partially obscured. The heart is top-normal in size. The central pulmonary vascularity is prominent. The endotracheal tube tip lies 3.8 cm above the carina. The esophagogastric tube tip projects below the inferior margin of the image. The right internal jugular Cordis sheath or dialysis catheter tip projects over the proximal SVC. The small caliber left internal jugular venous catheter tip projects over the midportion of the SVC. IMPRESSION: Some overall worsening of pulmonary interstitial edema with development of bilateral pleural effusions layering posteriorly. The support tubes are in stable position. Electronically Signed   By: David  Martinique M.D.   On: 04/07/2016 07:54   Dg Chest Port 1 View  04/06/2016  CLINICAL DATA:  Central line placement EXAM: PORTABLE CHEST 1 VIEW COMPARISON:  Chest x-rays from earlier same day and 04/05/2016. FINDINGS: Status post left IJ central line placement with tip adequately positioned at the level of the upper SVC. Right IJ catheter stable in position. Endotracheal tube  remains well positioned with tip just above the level of the carina. Enteric tube passes below the diaphragm. Mild cardiomegaly is stable. Central pulmonary vascular congestion again noted. There is mild bilateral interstitial edema, improved compared to the chest x-ray from earlier today. No pleural effusion or  pneumothorax seen. IMPRESSION: 1. New left IJ central line in place with tip adequately positioned at the level of the upper SVC. Could consider advancing for more optimal radiographic positioning. 2. Remainder of the support apparatus appears stable in position. 3. Improved aeration within each lung suggesting decreased edema (improved fluid status). Central pulmonary vascular congestion and mild perihilar edema persists. 4. No pneumothorax. Electronically Signed   By: Franki Cabot M.D.   On: 04/06/2016 20:56   Dg Chest Port 1 View  04/06/2016  CLINICAL DATA:  Status post central line placement EXAM: PORTABLE CHEST 1 VIEW COMPARISON:  04/05/2016 FINDINGS: Cardiac shadow is stable. Persistent bilateral lower lung opacities. A new nasogastric catheter and endotracheal tube are again seen and stable. A new right jugular catheter is noted. No pneumothorax is seen. Catheter tip is noted in the proximal superior vena cava. IMPRESSION: No pneumothorax following central line placement. Stable bilateral lower lung opacities. Electronically Signed   By: Inez Catalina M.D.   On: 04/06/2016 11:14    I have reviewed the patient's current medications.  Assessment/Plan: 1 CKD4/AKI remains nonoliguric,  Chem  Reflect CRRT.  Vol xs, acid/base/K ok.  Will hold Lasix.  O2 appears better with vol off. CVP acceptable. Will cont slow vol off and see if can get pressors down 2 Anemia esa/Fe no value today 3 HPTH will use Vit D when taking GI 4 MI per cards 5 Resp failure vol and ? pneu , On vent, O2 ok 6 Blind 7 Dm controlled P CRRt, 75 cc/h neg, replete phos, stop lasix   LOS: 4 days   Meryem Haertel L 04/08/2016,7:41 AM

## 2016-04-08 NOTE — Progress Notes (Signed)
Noted patient to have decreased UOP for the last few hours. Foley was flushed twice, no resistance met and return of flush noted. Bladder scan done and revealed 84m in bladder. MD notified and made aware. No new orders. Will continue to monitor closely.

## 2016-04-08 NOTE — Progress Notes (Signed)
Pharmacy Antibiotic Note  Troy Young is a 65 y.o. male admitted on 04/07/2016 with sepsis.   Antibiotics streamlined to Unasyn  CRRT continues Cultures negative, including RMSF   Plan: Unasyn 3 grams iv Q 6 hours while CRRT continues Continue to follow for dose adjustments   Height: 5\' 8"  (172.7 cm) Weight: 215 lb 6.2 oz (97.7 kg) IBW/kg (Calculated) : 68.4  Temp (24hrs), Avg:93.8 F (34.3 C), Min:93.4 F (34.1 C), Max:95.2 F (35.1 C)   Recent Labs Lab 03/14/2016 2038 03/17/2016 2233 04/05/16 0103 04/05/16 0440 04/05/16 0448 04/06/16 0227  04/06/16 1305  04/07/16 0030 04/07/16 0355 04/07/16 0756 04/07/16 1600 04/08/16 0350 04/08/16 0820  WBC 15.7*  --   --  15.6*  --  25.8*  --   --   --   --  12.3*  --   --   --  14.4*  CREATININE 3.41*  --   --  4.02*  --  4.99*  < > 5.92*  < > 2.82* 2.41* 2.20* 1.86* 1.46*  --   LATICACIDVEN  --  2.81* 4.22*  --  4.2*  --   --  1.6  --   --   --   --   --   --   --   < > = values in this interval not displayed.  Estimated Creatinine Clearance: 56.4 mL/min (by C-G formula based on Cr of 1.46).    Allergies  Allergen Reactions  . Pork-Derived Products Other (See Comments)    Pt does not eat pork or pork products    Antimicrobials this admission: Vancomycin 5/29>> 6/1 Zosyn 5/29>>6/1 Doxycycline 5/30>6/1 Unasyn 6/1>  Thank you  Anette Guarneri, PharmD [phone] 04/08/2016 11:15 AM

## 2016-04-08 DEATH — deceased

## 2016-04-09 ENCOUNTER — Inpatient Hospital Stay (HOSPITAL_COMMUNITY): Payer: Medicare HMO

## 2016-04-09 LAB — POCT ACTIVATED CLOTTING TIME
ACTIVATED CLOTTING TIME: 198 s
ACTIVATED CLOTTING TIME: 199 s
ACTIVATED CLOTTING TIME: 209 s
ACTIVATED CLOTTING TIME: 214 s
ACTIVATED CLOTTING TIME: 234 s
ACTIVATED CLOTTING TIME: 240 s
ACTIVATED CLOTTING TIME: 245 s
ACTIVATED CLOTTING TIME: 245 s
Activated Clotting Time: 229 seconds
Activated Clotting Time: 234 seconds
Activated Clotting Time: 239 seconds
Activated Clotting Time: 234 seconds
Activated Clotting Time: 198 seconds

## 2016-04-09 LAB — CBC
HCT: 31 % — ABNORMAL LOW (ref 39.0–52.0)
HEMOGLOBIN: 9.1 g/dL — AB (ref 13.0–17.0)
MCH: 29.1 pg (ref 26.0–34.0)
MCHC: 29.4 g/dL — AB (ref 30.0–36.0)
MCV: 99 fL (ref 78.0–100.0)
Platelets: 215 10*3/uL (ref 150–400)
RBC: 3.13 MIL/uL — ABNORMAL LOW (ref 4.22–5.81)
RDW: 14.4 % (ref 11.5–15.5)
WBC: 22.2 10*3/uL — ABNORMAL HIGH (ref 4.0–10.5)

## 2016-04-09 LAB — RENAL FUNCTION PANEL
ALBUMIN: 2.5 g/dL — AB (ref 3.5–5.0)
ALBUMIN: 2.3 g/dL — AB (ref 3.5–5.0)
ANION GAP: 9 (ref 5–15)
Anion gap: 9 (ref 5–15)
BUN: 15 mg/dL (ref 6–20)
BUN: 19 mg/dL (ref 6–20)
CHLORIDE: 102 mmol/L (ref 101–111)
CO2: 26 mmol/L (ref 22–32)
CO2: 25 mmol/L (ref 22–32)
CREATININE: 1.92 mg/dL — AB (ref 0.61–1.24)
Calcium: 7.9 mg/dL — ABNORMAL LOW (ref 8.9–10.3)
Calcium: 8.3 mg/dL — ABNORMAL LOW (ref 8.9–10.3)
Chloride: 101 mmol/L (ref 101–111)
Creatinine, Ser: 1.87 mg/dL — ABNORMAL HIGH (ref 0.61–1.24)
GFR calc Af Amer: 40 mL/min — ABNORMAL LOW (ref >60)
GFR, EST AFRICAN AMERICAN: 42 mL/min — AB (ref >60)
GFR, EST NON AFRICAN AMERICAN: 35 mL/min — AB (ref >60)
GFR, EST NON AFRICAN AMERICAN: 36 mL/min — AB (ref >60)
GLUCOSE: 161 mg/dL — AB (ref 65–99)
Glucose, Bld: 203 mg/dL — ABNORMAL HIGH (ref 65–99)
PHOSPHORUS: 4.1 mg/dL (ref 2.5–4.6)
PHOSPHORUS: 2 mg/dL — AB (ref 2.5–4.6)
POTASSIUM: 4.5 mmol/L (ref 3.5–5.1)
Potassium: 4.8 mmol/L (ref 3.5–5.1)
Sodium: 136 mmol/L (ref 135–145)
Sodium: 136 mmol/L (ref 135–145)

## 2016-04-09 LAB — CULTURE, BLOOD (ROUTINE X 2)
CULTURE: NO GROWTH
Culture: NO GROWTH

## 2016-04-09 LAB — APTT: aPTT: >200 seconds (ref 24–37)

## 2016-04-09 LAB — GLUCOSE, CAPILLARY
GLUCOSE-CAPILLARY: 141 mg/dL — AB (ref 65–99)
GLUCOSE-CAPILLARY: 172 mg/dL — AB (ref 65–99)
GLUCOSE-CAPILLARY: 185 mg/dL — AB (ref 65–99)
Glucose-Capillary: 155 mg/dL — ABNORMAL HIGH (ref 65–99)
Glucose-Capillary: 189 mg/dL — ABNORMAL HIGH (ref 65–99)

## 2016-04-09 LAB — MAGNESIUM: MAGNESIUM: 2.5 mg/dL — AB (ref 1.7–2.4)

## 2016-04-09 NOTE — Progress Notes (Signed)
PULMONARY / CRITICAL CARE MEDICINE   Name: Troy Young MRN: [id number] DOB: 01/17/1951    ADMISSION DATE:  03/19/2016  CHIEF COMPLAINT:  Difficulty Breathing  HISTORY OF PRESENT ILLNESS:   Troy Young is an 65 y.o. 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 He states that his symptoms began on Friday after he had food from [employer]. He has also complained of generalized edema, and is up about 15 lbs. In the ED, he was found to have a low-grade fever, and was treated for presumed sepsis with multiple liters of IVF, and his lactic acid rose from mid-2s to mid-4s. He denies subjective fevers, chills, etc. 5/29 required intubation and 100% fio2 with peep 14.   SUBJECTIVE: Less Fio2. Paralyzed, sedated.  Tolerating CVVH. No other issues.  Had hematuria. Heparin drip dc'd.   VITAL SIGNS: BP 118/70 mmHg  Pulse 113  Temp(Src) 98.4 F (36.9 C) (Core (Comment))  Resp 11  Ht 5\' 8"  (1.727 m)  Wt 88.5 kg (195 lb 1.7 oz)  BMI 29.67 kg/m2  SpO2 96%  HEMODYNAMICS: CVP:  [9 mmHg-14 mmHg] 14 mmHg  VENTILATOR SETTINGS: Vent Mode:  [-] PSV FiO2 (%):  [30 %-50 %] 50 % Set Rate:  [20 bmp] 20 bmp Vt Set:  [410 mL] 410 mL PEEP:  [8 cmH20] 8 cmH20 Pressure Support:  [12 cmH20] 12 cmH20 Plateau Pressure:  [17 cmH20-28 cmH20] 17 cmH20  INTAKE / OUTPUT: I/O last 3 completed shifts: In: 4351.7 [I.V.:2166.9; Other:20; NG/GT:772.8; IV Piggyback:1392] Out: [id number]; Emesis/NG output:200; Other:5860]  PHYSICAL EXAMINATION: General:  Elderly man intubated, sedated , on pressors. paralyzed Neuro:  Blind, sedated heavily HEENT:  MMM, ott->vent Cardiovascular:  Heart sounds rrr Lungs:  Bibasilar crackles Abdomen:  Firm, but nontender, OGT to LWIS Musculoskeletal:  Gr 1 edema > less now Skin:  No visible rashes  LABS:  BMET  Recent Labs Lab  04/08/16 0350 04/08/16 1630 04/09/16 0728  NA 136 137 136  K 4.3 4.3 4.8  CL 103 101 101  CO2 26 27 26   BUN 11 9 15   CREATININE 1.46* 1.67* 1.92*  GLUCOSE 146* 106* 161*  Procal>>6.5  Electrolytes  Recent Labs Lab 04/07/16 0355  04/08/16 0350 04/08/16 1630 04/09/16 0431 04/09/16 0728  CALCIUM 7.8*  < > 7.8* 8.0*  --  7.9*  MG 2.0  --  2.2  --  2.5*  --   PHOS 1.6*  < > 2.4* 4.2  --  4.1  < > = values in this interval not displayed.  CBC  Recent Labs Lab 04/07/16 0355 04/07/16 0756 04/08/16 0820 04/09/16 0431  WBC 12.3*  --  14.4* 22.2*  HGB 8.2* 8.8* 8.9* 9.1*  HCT 25.7* 26.0* 28.5* 31.0*  PLT 147*  --  164 215    Coag's  Recent Labs Lab 04/05/16 0440 04/07/16 0355 04/08/16 0350 04/09/16 0431  APTT >200* >200* >200* >200*  INR 1.81* 2.19*  --   --     Sepsis Markers  Recent Labs Lab 04/05/16 0103 04/05/16 0448 04/05/16 1600 04/06/16 0227 04/06/16 1305 04/07/16 0355  LATICACIDVEN 4.22* 4.2*  --   --  1.6  --   PROCALCITON  --   --  3.96 6.50  --  3.06    ABG  Recent Labs Lab 04/07/16 0438 04/07/16 0902 04/08/16 1622  PHART 7.575* 7.432 7.314*  PCO2ART 25.3*  35.7 54.5*  PO2ART 130.0* 72.0* 60.0*    Liver Enzymes  Recent Labs Lab 03/28/2016 2038 04/05/16 0440  04/08/16 0350 04/08/16 1630 04/09/16 0728  AST 27 42*  --   --   --   --   ALT 17 32  --   --   --   --   ALKPHOS 70 61  --   --   --   --   BILITOT 1.0 1.2  --   --   --   --   ALBUMIN 3.3* 2.9*  < > 2.2* 2.3* 2.5*  < > = values in this interval not displayed.  Cardiac Enzymes  Recent Labs Lab 04/05/16 0440 04/05/16 0809 04/05/16 1320  TROPONINI 1.33* 2.00* 6.41*    Glucose  Recent Labs Lab 04/08/16 0812 04/08/16 1212 04/08/16 1559 04/08/16 1936 04/09/16 0006 04/09/16 0358  GLUCAP 142* 148* 93 126* 141* 172*    Imaging Dg Chest Port 1 View  04/09/2016  CLINICAL DATA:  Respiratory failure, NSTEMI, diabetes, chronic renal insufficiency, sepsis EXAM:  PORTABLE CHEST 1 VIEW COMPARISON:  Portable chest x-ray of Apr 07, 2016 FINDINGS: The lungs are slightly less well inflated today. There are bilateral pleural effusions layering posteriorly. The interstitial markings are coarse bilaterally but stable. The cardiac silhouette is mildly enlarged the pulmonary vascularity is engorged and indistinct. The endotracheal tube tip lies 1.9 cm above the carina. The esophagogastric tube tip and proximal port lie below the GE junction. The right internal jugular Cordis sheath and the left internal jugular venous catheter tip projects over the proximal SVC. IMPRESSION: CHF with mild interstitial edema and moderate-sized bilateral pleural effusions layering posteriorly. Stable left lower lobe atelectasis or pneumonia. Electronically Signed   By: David  Martinique M.D.   On: 04/09/2016 07:35     STUDIES:  5/29 2 d >>  CULTURES: Blood 5/29 (pending)>> (-) Urine 5/29 (pending)>> (-) Sputum 5/29>> (-)  ANTIBIOTICS: Ceftriaxone x1 dose 5/29 >> Vanc 5/29 >> 6/1 Pip-tazo 5/29 >> 6/1 Unasyn 6/1 >>  SIGNIFICANT EVENTS: Bolused 3L in ED 5/29 intubated 5/30 worsening renal failure, on pressors, hypekalemia  LINES/TUBES: PIV 5/29 OTT>> 5/30 rt rad a line>> 5/30 rt i j HD cath>> 5/30 L TLC >>  DISCUSSION: 65 y/o man with Acute exacerbation of heart failure.  ASSESSMENT / PLAN:  PULMONARY A: Acute Hypoxemic Resp Failure due to Pulmonary Edema, Demand Ischemia, Asp PNA P:   Vent bundle  Doing daily PST. Cutting down on sedation. Not awake enough to be extubated today.  Treat heart failure, cont CRRT.  Anticipate, if and when he wakes up, we can extubate him.   CARDIOVASCULAR A:  Acute exacerbation of ischemic systolic cardiomyopathy NSTEMI  H/O HTN P:  Heparin infusion was dc'd 2/2 bleeding/anemia Cards consulted and following Echo EF 40-45% Cont cvvh BP meds on hold 2/2 hypotension  RENAL Lab Results  Component Value Date   CREATININE 1.92*  04/09/2016   CREATININE 1.67* 04/08/2016   CREATININE 1.46* 04/08/2016   CREATININE 3.60* 11/10/2015  \  Recent Labs Lab 04/08/16 0350 04/08/16 1630 04/09/16 0728  K 4.3 4.3 4.8     A:   Acute on chronic kidney injury CKD 4 (baseline) Hyperkalemia P:   Cont CVVH Renal consulted Correct electrolytes  GASTROINTESTINAL A:   No active issues P:   Cont TF  ppi   HEMATOLOGIC A:   Anemia, bleeding, hematuria P: S/P 1 uPRBC 6/1 > hb stable since off heparin Heparin dc'd 2/2 anemia/bleeding  INFECTIOUS A:   Asp pna.  P:   Vanc andd zosyn switched to unasyn 6/1. Cultures have been (-).  Noted fever spike last 12 hrs. Elevated WBC today. No inc in secretions and CXR stable.  Will observe. If persistent fever or elevated WBC, consider switching back to Vanc and zosyn over the weekend.  Wean off neo drip  ENDOCRINE CBG (last 3)   Recent Labs  04/08/16 1936 04/09/16 0006 04/09/16 0358  GLUCAP 126* 141* 172*    A:   DM2  P:   Cont with SSI + basal (8 U nightly of lantus)  NEUROLOGIC A:   Blind  P:   Barely doing anything. Versed has been off since 6/1.  Will cut down fentanyl further.  Off paralytics x 24 hrs.    FAMILY  - Updates: No family at bedside.  - Inter-disciplinary family meet or Palliative Care meeting due by:  June 5  [place] time today spent on this pt today was 40  mins.   Monica Becton, MD 04/09/2016, 9:33 AM [facility] Pager [phone] After 3 pm or if no answer, call [phone]

## 2016-04-09 NOTE — Progress Notes (Signed)
Subjective: Interval History: lower dose pressors, on vent..  Objective: Vital signs in last 24 hours: Temp:  [93.9 F (34.4 C)-100 F (37.8 C)] 97 F (36.1 C) (06/02 0700) Pulse Rate:  [75-126] 113 (06/02 0300) Resp:  [10-28] 13 (06/02 0700) BP: (87-119)/(51-71) 109/71 mmHg (06/02 0600) SpO2:  [91 %-100 %] 95 % (06/02 0400) Arterial Line BP: (94-128)/(50-63) 121/60 mmHg (06/02 0700) FiO2 (%):  [30 %-50 %] 50 % (06/01 2340) Weight:  [88.5 kg (195 lb 1.7 oz)] 88.5 kg (195 lb 1.7 oz) (06/02 0401) Weight change: -1.2 kg (-2 lb 10.3 oz)  Intake/Output from previous day: 06/01 0701 - 06/02 0700 In: 2861.9 [I.V.:1159.1; NG/GT:772.8; IV Piggyback:910] Out: 4820 [Urine:450; Emesis/NG output:200] Intake/Output this shift:    General appearance: sedate, nonresponsive Neck: RIJ cath Resp: rhonchi bilaterally Cardio: S1, S2 normal and systolic murmur: holosystolic 2/6, blowing at apex GI: pos bs, mild distension Extremities: edema 2-3+  Lab Results:  Recent Labs  04/08/16 0820 04/09/16 0431  WBC 14.4* 22.2*  HGB 8.9* 9.1*  HCT 28.5* 31.0*  PLT 164 215   BMET:  Recent Labs  04/08/16 0350 04/08/16 1630  NA 136 137  K 4.3 4.3  CL 103 101  CO2 26 27  GLUCOSE 146* 106*  BUN 11 9  CREATININE 1.46* 1.67*  CALCIUM 7.8* 8.0*    Recent Labs  04/06/16 1305  PTH 341*   Iron Studies:  Recent Labs  04/06/16 1305  IRON 10*  TIBC 244*    Studies/Results: US Renal  04/07/2016  CLINICAL DATA:  Acute kidney injury. History of renal insufficiency and hypertension as well diabetes. EXAM: RENAL / URINARY TRACT ULTRASOUND COMPLETE COMPARISON:  Renal ultrasound, 06/11/2015 FINDINGS: Right Kidney: Length: 10.4 cm. Increased parenchymal echogenicity. No renal mass or stone. No hydronephrosis. Left Kidney: Length: 10.9 cm. Increased parenchymal echogenicity. No renal mass or stone. No hydronephrosis. Bladder: Decompressed with a Foley catheter. Note is made of a left pleural  effusion. IMPRESSION: 1. No hydronephrosis.  No acute finding. 2. Increased renal parenchymal echogenicity suggesting medical renal disease. Electronically Signed   By: Lajean Manes M.D.   On: 04/07/2016 10:54    I have reviewed the patient's current medications.  Assessment/Plan: 1 CKD4/AKI vol xs.  Chem pending. Has had good solute/acid/base/K . Will review current..  Mechanics going well with CRRT.  Vol xs on exam and CXR 2 Anemia on esa/fe 3 Pneu on Vanc/Zosyn 4 CAD MI per cards 5 DM controlled 6 Blind 7 VDRF vol and Pneu 8 Nutrition on TF P CRRT, vol removal ^, AB, vent, TF   LOS: 5 days   Raquell Richer L 04/09/2016,7:31 AM

## 2016-04-09 NOTE — Progress Notes (Signed)
Patient Name: Troy Young Date of Encounter: 04/09/2016  Active Problems:   DM2 (diabetes mellitus, type 2) ([place])   HTN (hypertension)   CKD stage 4 due to type 2 diabetes mellitus (HCC)   Elevated troponin   Dyspnea   HLD (hyperlipidemia)   BPH (benign prostatic hyperplasia)   Sepsis ([place])   Type 2 diabetes mellitus with diabetic nephropathy (HCC)   Blindness, legal   Acute on chronic systolic heart failure, NYHA class 4 (HCC)   Leukocytosis   Wheezing   Heart failure (HCC)   Acute respiratory failure with hypoxia (HCC)   AKI (acute kidney injury) ([place])   Aspiration pneumonia ([place])   NSTEMI (non-ST elevated myocardial infarction) ([place])   Length of Stay: 5  SUBJECTIVE  Sedated, intubated. A little reduction in pressor dose. Still on CRRT. Volume seems to back to admission levels, when he was estimated to be hypervolemic.   CURRENT MEDS . ampicillin-sulbactam (UNASYN) IV  3 g Intravenous Q6H  . antiseptic oral rinse  7 mL Mouth Rinse 10 times per day  . aspirin  81 mg Oral Daily  . atorvastatin  80 mg Oral q1800  . chlorhexidine gluconate (SAGE KIT)  15 mL Mouth Rinse BID  . darbepoetin (ARANESP) injection - DIALYSIS  200 mcg Intravenous Q Wed-HD  . feeding supplement (PRO-STAT SUGAR FREE 64)  60 mL Per Tube TID  . ferumoxytol  510 mg Intravenous Weekly  . insulin aspart  0-15 Units Subcutaneous Q4H  . insulin glargine  8 Units Subcutaneous QHS  . mupirocin ointment  1 application Nasal BID  . pantoprazole sodium  40 mg Per Tube Daily  . sodium chloride flush  10-40 mL Intracatheter Q12H  . sodium chloride flush  3 mL Intravenous Q12H    OBJECTIVE   Intake/Output Summary (Last 24 hours) at 04/09/16 0856 Last data filed at 04/09/16 0800  Gross per 24 hour  Intake 2845.34 ml  Output   4769 ml  Net -1923.66 ml   Filed Weights   04/07/16 0248 04/08/16 0343 04/09/16 0401  Weight: 89.9 kg (198 lb 3.1 oz) 89.7 kg (197 lb 12 oz) 88.5 kg (195 lb 1.7 oz)    PHYSICAL  EXAM Filed Vitals:   04/09/16 0500 04/09/16 0600 04/09/16 0700 04/09/16 0800  BP:  109/71  118/70  Pulse:      Temp: 97.7 F (36.5 C) 97.9 F (36.6 C) 97 F (36.1 C) 95.5 F (35.3 C)  TempSrc:    Core (Comment)  Resp: _0 Weight:      SpO2:    96%   General: sedated Head: intubated Neck: mildly elevated jugular venous pulsations and +ve hepatojugular reflux; brisk carotid pulses without delay and no carotid bruits Chest: clear to auscultation, no signs of consolidation by percussion or palpation, normal fremitus, symmetrical and full respiratory excursions Cardiovascular: normal position and quality of the apical impulse, regular rhythm, normal first and second heart sounds, no rubs or gallops, no murmur Abdomen: no tenderness or distention, no masses by palpation, no abnormal pulsatility or arterial bruits, hypoactive bowel sounds, no hepatosplenomegaly Extremities: no clubbing, cyanosis or edema;  Neurological: unable to assess  LABS  CBC  Recent Labs  04/08/16 0820 04/09/16 0431  WBC 14.4* 22.2*  HGB 8.9* 9.1*  HCT 28.5* 31.0*  MCV 96.3 99.0  PLT 164 161   Basic Metabolic Panel  Recent Labs  04/08/16 0350 04/08/16 1630 04/09/16 0431 04/09/16 0728  NA 136 137  --  136  K 4.3 4.3  --  4.8  CL 103 101  --  101  CO2 26 27  --  26  GLUCOSE 146* 106*  --  161*  BUN 11 9  --  15  CREATININE 1.46* 1.67*  --  1.92*  CALCIUM 7.8* 8.0*  --  7.9*  MG 2.2  --  2.5*  --   PHOS 2.4* 4.2  --  4.1   Liver Function Tests  Recent Labs  04/08/16 1630 04/09/16 0728  ALBUMIN 2.3* 2.5*    Radiology Studies Imaging results have been reviewed and US Renal  04/07/2016  CLINICAL DATA:  Acute kidney injury. History of renal insufficiency and hypertension as well diabetes. EXAM: RENAL / URINARY TRACT ULTRASOUND COMPLETE COMPARISON:  Renal ultrasound, 06/11/2015 FINDINGS: Right Kidney: Length: 10.4 cm. Increased parenchymal echogenicity. No renal mass or stone. No  hydronephrosis. Left Kidney: Length: 10.9 cm. Increased parenchymal echogenicity. No renal mass or stone. No hydronephrosis. Bladder: Decompressed with a Foley catheter. Note is made of a left pleural effusion. IMPRESSION: 1. No hydronephrosis.  No acute finding. 2. Increased renal parenchymal echogenicity suggesting medical renal disease. Electronically Signed   By: Lajean Manes M.D.   On: 04/07/2016 10:54   Dg Chest Port 1 View  04/09/2016  CLINICAL DATA:  Respiratory failure, NSTEMI, diabetes, chronic renal insufficiency, sepsis EXAM: PORTABLE CHEST 1 VIEW COMPARISON:  Portable chest x-ray of Apr 07, 2016 FINDINGS: The lungs are slightly less well inflated today. There are bilateral pleural effusions layering posteriorly. The interstitial markings are coarse bilaterally but stable. The cardiac silhouette is mildly enlarged the pulmonary vascularity is engorged and indistinct. The endotracheal tube tip lies 1.9 cm above the carina. The esophagogastric tube tip and proximal port lie below the GE junction. The right internal jugular Cordis sheath and the left internal jugular venous catheter tip projects over the proximal SVC. IMPRESSION: CHF with mild interstitial edema and moderate-sized bilateral pleural effusions layering posteriorly. Stable left lower lobe atelectasis or pneumonia. Electronically Signed   By: David  Martinique M.D.   On: 04/09/2016 07:35    TELE Sinus tachycardia  ASSESSMENT AND PLAN  Acute on Chronic systolic and diastolic heart failure: EF 40-45% on TTE on 04/06/16 with akinesis of mid-apical anteroseptal and anterolateral myocardium and moderate mitral valve regurgitation, moderate tricuspid regurgitation and pulmonary hypertension. CXR today still shows pulmonary edema with b/l pleural effusions posteriorly.  Probably still 3-4L overloaded. Not ready for weaning. -Daily weights -Strict I/Os -Lasix 160 mg IV Q6H -On CRRT  CAD s/p NSTEMI: Likely secondary to demand ischemia from  underlying CAD in the setting of hypotension and heart failure. Myoview in September 2016 showed intermediate risk stress nuclear study with a large, severe, partially reversible defect in the mid/distal anterior wall, distal inferior wall and apex; findings consistent with prior infarct and mild to moderate peri-infarct ischemia. Echo this admission shows wall motion abnormality in the same distribution. -Off Heparin gtt due to dropping hemoglobin in the setting of bloody urine. -ASA 81 mg daily -Atorvastatin 80 mg daily  Acute nonoliguric renal failure on Chronic CKD IV: Baseline creatinine 3.5 with baseline GFR in the 20s. Given worsening renal function, oliguria, and acidemia, patient was started on CRRT. Unlikely to show major improvement until we wean pressors.  IDDM: Well controlled, HgbA1c 6.3 on admission.  Septic Shock: Possibly due to pneumonia. Patient has been hypotensive and hypothermic. WBC had been steadily improving, worse this morning.On Unasyn now. Intubated and sedated, no longer  on paralytics, now with greatly improved O2 requirements. Wean pressors as BP allows.   Sanda Klein, MD, [facility] [phone] office [phone] pager 04/09/2016 8:56 AM

## 2016-04-10 ENCOUNTER — Inpatient Hospital Stay (HOSPITAL_COMMUNITY): Payer: Medicare HMO

## 2016-04-10 LAB — GLUCOSE, CAPILLARY
GLUCOSE-CAPILLARY: 170 mg/dL — AB (ref 65–99)
GLUCOSE-CAPILLARY: 177 mg/dL — AB (ref 65–99)
GLUCOSE-CAPILLARY: 202 mg/dL — AB (ref 65–99)
Glucose-Capillary: 196 mg/dL — ABNORMAL HIGH (ref 65–99)
Glucose-Capillary: 96 mg/dL (ref 65–99)
Glucose-Capillary: 90 mg/dL (ref 65–99)
Glucose-Capillary: 121 mg/dL — ABNORMAL HIGH (ref 65–99)

## 2016-04-10 LAB — RENAL FUNCTION PANEL
ALBUMIN: 2.2 g/dL — AB (ref 3.5–5.0)
Albumin: 2.2 g/dL — ABNORMAL LOW (ref 3.5–5.0)
Anion gap: 9 (ref 5–15)
Anion gap: 11 (ref 5–15)
BUN: 23 mg/dL — ABNORMAL HIGH (ref 6–20)
BUN: 21 mg/dL — AB (ref 6–20)
CALCIUM: 8.5 mg/dL — AB (ref 8.9–10.3)
CALCIUM: 8.3 mg/dL — AB (ref 8.9–10.3)
CO2: 26 mmol/L (ref 22–32)
CO2: 24 mmol/L (ref 22–32)
CREATININE: 1.82 mg/dL — AB (ref 0.61–1.24)
Chloride: 101 mmol/L (ref 101–111)
Chloride: 104 mmol/L (ref 101–111)
Creatinine, Ser: 1.86 mg/dL — ABNORMAL HIGH (ref 0.61–1.24)
GFR calc Af Amer: 42 mL/min — ABNORMAL LOW (ref >60)
GFR calc non Af Amer: 36 mL/min — ABNORMAL LOW (ref >60)
GFR, EST AFRICAN AMERICAN: 43 mL/min — AB (ref >60)
GFR, EST NON AFRICAN AMERICAN: 37 mL/min — AB (ref >60)
GLUCOSE: 106 mg/dL — AB (ref 65–99)
Glucose, Bld: 197 mg/dL — ABNORMAL HIGH (ref 65–99)
PHOSPHORUS: 2.1 mg/dL — AB (ref 2.5–4.6)
PHOSPHORUS: 2.3 mg/dL — AB (ref 2.5–4.6)
Potassium: 4.4 mmol/L (ref 3.5–5.1)
Potassium: 3.8 mmol/L (ref 3.5–5.1)
SODIUM: 136 mmol/L (ref 135–145)
SODIUM: 139 mmol/L (ref 135–145)

## 2016-04-10 LAB — CBC
HCT: 29.1 % — ABNORMAL LOW (ref 39.0–52.0)
Hemoglobin: 8.7 g/dL — ABNORMAL LOW (ref 13.0–17.0)
MCH: 29.7 pg (ref 26.0–34.0)
MCHC: 29.9 g/dL — ABNORMAL LOW (ref 30.0–36.0)
MCV: 99.3 fL (ref 78.0–100.0)
PLATELETS: 210 10*3/uL (ref 150–400)
RBC: 2.93 MIL/uL — AB (ref 4.22–5.81)
RDW: 14.4 % (ref 11.5–15.5)
WBC: 25.8 10*3/uL — AB (ref 4.0–10.5)

## 2016-04-10 LAB — MAGNESIUM: MAGNESIUM: 2.7 mg/dL — AB (ref 1.7–2.4)

## 2016-04-10 LAB — POCT ACTIVATED CLOTTING TIME
ACTIVATED CLOTTING TIME: 193 s
Activated Clotting Time: 214 seconds
Activated Clotting Time: 214 seconds

## 2016-04-10 LAB — APTT: aPTT: 136 seconds — ABNORMAL HIGH (ref 24–37)

## 2016-04-10 MED ORDER — ASPIRIN 300 MG RE SUPP
150.0000 mg | Freq: Every day | RECTAL | Status: DC
  Administered 2016-04-10 – 2016-04-12 (×3): 150 mg via RECTAL
  Filled 2016-04-10 (×3): qty 1

## 2016-04-10 MED ORDER — SODIUM PHOSPHATES 45 MMOLE/15ML IV SOLN
30.0000 mmol | Freq: Once | INTRAVENOUS | Status: AC
  Administered 2016-04-10: 30 mmol via INTRAVENOUS
  Filled 2016-04-10: qty 10

## 2016-04-10 MED ORDER — PANTOPRAZOLE SODIUM 40 MG IV SOLR
40.0000 mg | INTRAVENOUS | Status: DC
  Administered 2016-04-10: 40 mg via INTRAVENOUS
  Filled 2016-04-10: qty 40

## 2016-04-10 NOTE — Progress Notes (Signed)
Vent changes made by MD, RN aware.  Also, while RN giving mougth care, pt vomited and aspirated stomach contents in lungs.  Pt was suctioned orally and via ETT until secretions cleared.  MD aware.

## 2016-04-10 NOTE — Progress Notes (Signed)
Pt vomited while giving oral medications via OGT. Tube feedings held and MD made aware. New orders received and followed. Hold TF for now, change route of medications and place OGT to low intermittent suction. ETT and Oral suction done until secretions cleared. Will continue to monitor closely.

## 2016-04-10 NOTE — Progress Notes (Signed)
PULMONARY / CRITICAL CARE MEDICINE   Name: Troy Young MRN: [id number] DOB: 1951/05/18    ADMISSION DATE:  04/05/2016  CHIEF COMPLAINT:  Difficulty Breathing  SUBJECTIVE: Tolerating pressure support.  Still on CRRT.  VITAL SIGNS: BP 123/72 mmHg  Pulse 105  Temp(Src) 98.1 F (36.7 C) (Core (Comment))  Resp 17  Ht 5\' 8"  (1.727 m)  Wt 188 lb 4.4 oz (85.4 kg)  BMI 28.63 kg/m2  SpO2 100%  HEMODYNAMICS: CVP:  [10 mmHg-14 mmHg] 10 mmHg  VENTILATOR SETTINGS: Vent Mode:  [-] PRVC FiO2 (%):  [50 %] 50 % Set Rate:  [20 bmp] 20 bmp Vt Set:  [410 mL] 410 mL PEEP:  [8 cmH20] 8 cmH20 Plateau Pressure:  [21 cmH20] 21 cmH20  INTAKE / OUTPUT: I/O last 3 completed shifts: In: 2815.6 [I.V.:598.1; NG/GT:1617.5; IV [id number] Out: [id number] [Urine:220; Other:6224]  PHYSICAL EXAMINATION: General: on vent, CRRT Neuro: opens eyes with stimulation, doesn't follow commands HEENT: ETT in place Cardiac: regular, no murmur Chest: no wheeze Abd: soft, non tender, mild distention Ext: no edema Skin: no rashes  LABS:  BMET  Recent Labs Lab 04/09/16 0728 04/09/16 1709 04/10/16 0420  NA 136 136 136  K 4.8 4.5 4.4  CL 101 102 101  CO2 26 25 26   BUN 15 19 23*  CREATININE 1.92* 1.87* 1.82*  GLUCOSE 161* 203* 197*   Electrolytes  Recent Labs Lab 04/08/16 0350  04/09/16 0431 04/09/16 0728 04/09/16 1709 04/10/16 0420  CALCIUM 7.8*  < >  --  7.9* 8.3* 8.5*  MG 2.2  --  2.5*  --   --  2.7*  PHOS 2.4*  < >  --  4.1 2.0* 2.1*  < > = values in this interval not displayed.  CBC  Recent Labs Lab 04/08/16 0820 04/09/16 0431 04/10/16 0420  WBC 14.4* 22.2* 25.8*  HGB 8.9* 9.1* 8.7*  HCT 28.5* 31.0* 29.1*  PLT 164 215 210    Coag's  Recent Labs Lab 04/05/16 0440 04/07/16 0355 04/08/16 0350 04/09/16 0431 04/10/16 0420  APTT >200* >200* >200* >200* 136*  INR 1.81* 2.19*  --   --   --     Sepsis Markers  Recent Labs Lab 04/05/16 0103 04/05/16 0448  04/05/16 1600 04/06/16 0227 04/06/16 1305 04/07/16 0355  LATICACIDVEN 4.22* 4.2*  --   --  1.6  --   PROCALCITON  --   --  3.96 6.50  --  3.06    ABG  Recent Labs Lab 04/07/16 0438 04/07/16 0902 04/08/16 1622  PHART 7.575* 7.432 7.314*  PCO2ART 25.3* 35.7 54.5*  PO2ART 130.0* 72.0* 60.0*    Liver Enzymes  Recent Labs Lab 03/11/2016 2038 04/05/16 0440  04/09/16 0728 04/09/16 1709 04/10/16 0420  AST 27 42*  --   --   --   --   ALT 17 32  --   --   --   --   ALKPHOS 70 61  --   --   --   --   BILITOT 1.0 1.2  --   --   --   --   ALBUMIN 3.3* 2.9*  < > 2.5* 2.3* 2.2*  < > = values in this interval not displayed.  Cardiac Enzymes  Recent Labs Lab 04/05/16 0440 04/05/16 0809 04/05/16 1320  TROPONINI 1.33* 2.00* 6.41*    Glucose  Recent Labs Lab 04/09/16 0801 04/09/16 1156 04/09/16 1554 04/09/16 2021 04/09/16 2355 04/10/16 0425  GLUCAP 155* 185* 189* 196* 202* 96  Imaging Dg Chest Port 1 View  04/10/2016  CLINICAL DATA:  Respiratory failure EXAM: PORTABLE CHEST 1 VIEW COMPARISON:  04/09/2016 FINDINGS: Stable endotracheal tube with tip nearly 2 cm above the carina. Bilateral IJ central line with tips at the SVC. An orogastric tube reaches the stomach at least. Improved basilar in aeration. Lung volumes remain low. No pneumothorax. Normal heart size. IMPRESSION: 1. Unchanged positioning of tubes and lines. The endotracheal tube tip is ~2 cm above the carina. 2. Improved basilar aeration. Electronically Signed   By: Monte Fantasia M.D.   On: 04/10/2016 06:56     STUDIES:  5/29 Echo >> EF 40 to 45%, PAS 58 mmHg, mod MR, mod TR  CULTURES: Blood 5/29 (pending)>> (-) Urine 5/29 (pending)>> (-) Sputum 5/29>> (-)  ANTIBIOTICS: Ceftriaxone x1 dose 5/29 >> Vanc 5/29 >> 6/1 Pip-tazo 5/29 >> 6/1 Unasyn 6/1 >>  SIGNIFICANT EVENTS: 5/28 Bolused 3L in ED 5/29 intubated 5/30 worsening renal failure, on pressors, hypekalemia  LINES/TUBES: 5/29 ETT>> 5/30  rt rad a line>> 5/30 rt i j HD cath>> 5/30 L TLC >>  DISCUSSION: 65 yo male with progressive dyspnea, edema from acute on chronic systolic CHF and aspiration pneumonia.  ASSESSMENT / PLAN:  PULMONARY A: Acute Hypoxemic Resp Failure due to Pulmonary Edema, Demand Ischemia, Asp PNA P:   Pressure support wean as tolerated >> mental status prevents extubation trial F/u CXR  CARDIOVASCULAR A:  Acute exacerbation of ischemic systolic cardiomyopathy NSTEMI  H/O HTN P:  Heparin infusion was dc'd 2/2 bleeding/anemia Cards consulted and following  RENAL A:   Acute on chronic kidney injury CKD 4 (baseline) Hyperkalemia P:   Continue CVVH per renal Correct electrolytes  GASTROINTESTINAL A:   Nutrition. P:   Continue tube feeds Protonix  HEMATOLOGIC A:   Anemia, bleeding, hematuria P: S/P 1 uPRBC 6/1 > hb stable since off heparin Heparin dc'd 2/2 anemia/bleeding SCDs  INFECTIOUS A:   Asp pna.  P:   Day 7 of Abx >> d/c after dose on 6/03  ENDOCRINE A:   DM2. P:   Continue with SSI + basal (8 U nightly of lantus)  NEUROLOGIC A:   Acute metabolic encephalopathy >> Off fentanyl since 1741 on 04/09/16, versed since 0900 on 04/08/16 Blind. P:   Monitor mental status   CC time 34 minutes.  Chesley Mires, MD [facility] 04/10/2016, 8:08 AM Pager:  [phone] After 3pm call: [phone]

## 2016-04-10 NOTE — Progress Notes (Signed)
Subjective: Interval History: sedated, not responsive.  Objective: Vital signs in last 24 hours: Temp:  [95.5 F (35.3 C)-99.7 F (37.6 C)] 98.1 F (36.7 C) (06/03 0700) Pulse Rate:  [105-123] 105 (06/03 0700) Resp:  [11-29] 17 (06/03 0700) BP: (107-142)/(64-76) 123/72 mmHg (06/03 0600) SpO2:  [92 %-100 %] 100 % (06/03 0700) Arterial Line BP: (114-151)/(54-74) 133/61 mmHg (06/03 0700) FiO2 (%):  [50 %] 50 % (06/03 0400) Weight:  [85.4 kg (188 lb 4.4 oz)] 85.4 kg (188 lb 4.4 oz) (06/03 0329) Weight change: -3.1 kg (-6 lb 13.3 oz)  Intake/Output from previous day: 06/02 0701 - 06/03 0700 In: 1618.1 [I.V.:118.1; NG/GT:1100; IV Piggyback:400] Out: 4144 [Urine:180] Intake/Output this shift:    General appearance: sedated, on vent, not responsive to verbal stim Neck: R IJ cath Resp: diminished breath sounds bilaterally and rales bibasilar Cardio: S1, S2 normal and systolic murmur: holosystolic 2/6, blowing at apex GI: pos bs, mild distension, liver down 6 cm Extremities: edema 3+  Lab Results:  Recent Labs  04/09/16 0431 04/10/16 0420  WBC 22.2* 25.8*  HGB 9.1* 8.7*  HCT 31.0* 29.1*  PLT 215 210   BMET:  Recent Labs  04/09/16 1709 04/10/16 0420  NA 136 136  K 4.5 4.4  CL 102 101  CO2 25 26  GLUCOSE 203* 197*  BUN 19 23*  CREATININE 1.87* 1.82*  CALCIUM 8.3* 8.5*   No results for input(s): PTH in the last 72 hours. Iron Studies: No results for input(s): IRON, TIBC, TRANSFERRIN, FERRITIN in the last 72 hours.  Studies/Results: Dg Chest Port 1 View  04/10/2016  CLINICAL DATA:  Respiratory failure EXAM: PORTABLE CHEST 1 VIEW COMPARISON:  04/09/2016 FINDINGS: Stable endotracheal tube with tip nearly 2 cm above the carina. Bilateral IJ central line with tips at the SVC. An orogastric tube reaches the stomach at least. Improved basilar in aeration. Lung volumes remain low. No pneumothorax. Normal heart size. IMPRESSION: 1. Unchanged positioning of tubes and lines. The  endotracheal tube tip is ~2 cm above the carina. 2. Improved basilar aeration. Electronically Signed   By: Monte Fantasia M.D.   On: 04/10/2016 06:56   Dg Chest Port 1 View  04/09/2016  CLINICAL DATA:  Respiratory failure, NSTEMI, diabetes, chronic renal insufficiency, sepsis EXAM: PORTABLE CHEST 1 VIEW COMPARISON:  Portable chest x-ray of Apr 07, 2016 FINDINGS: The lungs are slightly less well inflated today. There are bilateral pleural effusions layering posteriorly. The interstitial markings are coarse bilaterally but stable. The cardiac silhouette is mildly enlarged the pulmonary vascularity is engorged and indistinct. The endotracheal tube tip lies 1.9 cm above the carina. The esophagogastric tube tip and proximal port lie below the GE junction. The right internal jugular Cordis sheath and the left internal jugular venous catheter tip projects over the proximal SVC. IMPRESSION: CHF with mild interstitial edema and moderate-sized bilateral pleural effusions layering posteriorly. Stable left lower lobe atelectasis or pneumonia. Electronically Signed   By: David  Martinique M.D.   On: 04/09/2016 07:35    I have reviewed the patient's current medications.  Assessment/Plan: 1 CKD4/AKI vol improving. Solute/k/acid/base ok.  Will cont vol off as has improve O2 and CXR.  Suspect most changes vol, ? Mild asp. Will save arm as recovery may be limited 2 DM controlled 3 CAD s/p MI per cards 4 VDRF per CCM ? Wean 5 Nutrition TF 6 Blind P CRRT, ^ net neg, TF , vent, AB    LOS: 6 days   Anabia Weatherwax L 04/10/2016,7:28  AM    

## 2016-04-10 NOTE — Progress Notes (Signed)
RN called d/t pt w/ increased RR 40's and agitation.  Pt was placed back on full vent support.

## 2016-04-10 NOTE — Progress Notes (Signed)
Patient ID: Deaundre Allston, male   DOB: 1951/10/17, 65 y.o.   MRN: [id number]    Patient Name: Troy Young Date of Encounter: 04/10/2016     Active Problems:   DM2 (diabetes mellitus, type 2) ([place])   HTN (hypertension)   CKD stage 4 due to type 2 diabetes mellitus (HCC)   Elevated troponin   Dyspnea   HLD (hyperlipidemia)   BPH (benign prostatic hyperplasia)   Sepsis ([place])   Type 2 diabetes mellitus with diabetic nephropathy (HCC)   Blindness, legal   Acute on chronic systolic heart failure, NYHA class 4 (HCC)   Leukocytosis   Wheezing   Heart failure (HCC)   Acute respiratory failure with hypoxia (HCC)   AKI (acute kidney injury) ([place])   Aspiration pneumonia (HCC)   NSTEMI (non-ST elevated myocardial infarction) ([place])    SUBJECTIVE  Remains intubated. Opens eyes to commands but does not follow commands.   CURRENT MEDS . ampicillin-sulbactam (UNASYN) IV  3 g Intravenous Q6H  . antiseptic oral rinse  7 mL Mouth Rinse 10 times per day  . aspirin  81 mg Oral Daily  . atorvastatin  80 mg Oral q1800  . chlorhexidine gluconate (SAGE KIT)  15 mL Mouth Rinse BID  . darbepoetin (ARANESP) injection - DIALYSIS  200 mcg Intravenous Q Wed-HD  . feeding supplement (PRO-STAT SUGAR FREE 64)  60 mL Per Tube TID  . ferumoxytol  510 mg Intravenous Weekly  . insulin aspart  0-15 Units Subcutaneous Q4H  . insulin glargine  8 Units Subcutaneous QHS  . pantoprazole sodium  40 mg Per Tube Daily  . sodium chloride flush  10-40 mL Intracatheter Q12H  . sodium chloride flush  3 mL Intravenous Q12H  . sodium phosphate  Dextrose 5% IVPB  30 mmol Intravenous Once    OBJECTIVE  Filed Vitals:   04/10/16 0700 04/10/16 0800 04/10/16 0848 04/10/16 0856  BP:  119/71    Pulse: 105 102    Temp: 98.1 F (36.7 C) 98.1 F (36.7 C)    TempSrc:      Resp: 17 18    Weight:      SpO2: 100% 100% 100% 99%    Intake/Output Summary (Last 24 hours) at 04/10/16 0900 Last data filed at 04/10/16 0800  Gross per 24 hour  Intake   1510 ml  Output   3979 ml  Net  -2469 ml   Filed Weights   04/08/16 0343 04/09/16 0401 04/10/16 0329  Weight: 197 lb 12 oz (89.7 kg) 195 lb 1.7 oz (88.5 kg) 188 lb 4.4 oz (85.4 kg)    PHYSICAL EXAM  General:  Intubated, opens eyes. Neuro:  Moves all extremities spontaneously. HEENT:  Normal  Neck: Supple without bruits or JVD. Lungs:  Resp regular and unlabored, CTA. Heart: RRR no s3, s4, or murmurs. Abdomen: Soft, non-tender, non-distended, BS + x 4.  Extremities: No clubbing, cyanosis or edema. DP/PT/Radials 2+ and equal bilaterally.  Accessory Clinical Findings  CBC  Recent Labs  04/09/16 0431 04/10/16 0420  WBC 22.2* 25.8*  HGB 9.1* 8.7*  HCT 31.0* 29.1*  MCV 99.0 99.3  PLT 215 644   Basic Metabolic Panel  Recent Labs  04/09/16 0431  04/09/16 1709 04/10/16 0420  NA  --   < > 136 136  K  --   < > 4.5 4.4  CL  --   < > 102 101  CO2  --   < > 25 26  GLUCOSE  --   < >  203* 197*  BUN  --   < > 19 23*  CREATININE  --   < > 1.87* 1.82*  CALCIUM  --   < > 8.3* 8.5*  MG 2.5*  --   --  2.7*  PHOS  --   < > 2.0* 2.1*  < > = values in this interval not displayed. Liver Function Tests  Recent Labs  04/09/16 1709 04/10/16 0420  ALBUMIN 2.3* 2.2*   No results for input(s): LIPASE, AMYLASE in the last 72 hours. Cardiac Enzymes No results for input(s): CKTOTAL, CKMB, CKMBINDEX, TROPONINI in the last 72 hours. BNP Invalid input(s): POCBNP D-Dimer No results for input(s): DDIMER in the last 72 hours. Hemoglobin A1C No results for input(s): HGBA1C in the last 72 hours. Fasting Lipid Panel No results for input(s): CHOL, HDL, LDLCALC, TRIG, CHOLHDL, LDLDIRECT in the last 72 hours. Thyroid Function Tests No results for input(s): TSH, T4TOTAL, T3FREE, THYROIDAB in the last 72 hours.  Invalid input(s): FREET3  TELE  Nsr/sinus tachycardia  Radiology/Studies  Dg Chest 2 View  03/09/2016  CLINICAL DATA:  Acute onset of shortness  of breath and vomiting. Dry cough. Initial encounter. EXAM: CHEST  2 VIEW COMPARISON:  Chest radiograph performed 09/28/2015 FINDINGS: The lungs are well-aerated. Vascular congestion is noted. Mild bibasilar opacities may reflect mild interstitial edema. There is no evidence of pleural effusion or pneumothorax. The heart is borderline normal in size. No acute osseous abnormalities are seen. IMPRESSION: Vascular congestion noted. Mild bibasilar opacities may reflect mild interstitial edema. Electronically Signed   By: Garald Balding M.D.   On: 03/15/2016 20:21   US Renal  04/07/2016  CLINICAL DATA:  Acute kidney injury. History of renal insufficiency and hypertension as well diabetes. EXAM: RENAL / URINARY TRACT ULTRASOUND COMPLETE COMPARISON:  Renal ultrasound, 06/11/2015 FINDINGS: Right Kidney: Length: 10.4 cm. Increased parenchymal echogenicity. No renal mass or stone. No hydronephrosis. Left Kidney: Length: 10.9 cm. Increased parenchymal echogenicity. No renal mass or stone. No hydronephrosis. Bladder: Decompressed with a Foley catheter. Note is made of a left pleural effusion. IMPRESSION: 1. No hydronephrosis.  No acute finding. 2. Increased renal parenchymal echogenicity suggesting medical renal disease. Electronically Signed   By: Lajean Manes M.D.   On: 04/07/2016 10:54   Dg Chest Port 1 View  04/10/2016  CLINICAL DATA:  Respiratory failure EXAM: PORTABLE CHEST 1 VIEW COMPARISON:  04/09/2016 FINDINGS: Stable endotracheal tube with tip nearly 2 cm above the carina. Bilateral IJ central line with tips at the SVC. An orogastric tube reaches the stomach at least. Improved basilar in aeration. Lung volumes remain low. No pneumothorax. Normal heart size. IMPRESSION: 1. Unchanged positioning of tubes and lines. The endotracheal tube tip is ~2 cm above the carina. 2. Improved basilar aeration. Electronically Signed   By: Monte Fantasia M.D.   On: 04/10/2016 06:56   Dg Chest Port 1 View  04/09/2016  CLINICAL  DATA:  Respiratory failure, NSTEMI, diabetes, chronic renal insufficiency, sepsis EXAM: PORTABLE CHEST 1 VIEW COMPARISON:  Portable chest x-ray of Apr 07, 2016 FINDINGS: The lungs are slightly less well inflated today. There are bilateral pleural effusions layering posteriorly. The interstitial markings are coarse bilaterally but stable. The cardiac silhouette is mildly enlarged the pulmonary vascularity is engorged and indistinct. The endotracheal tube tip lies 1.9 cm above the carina. The esophagogastric tube tip and proximal port lie below the GE junction. The right internal jugular Cordis sheath and the left internal jugular venous catheter tip projects over the  proximal SVC. IMPRESSION: CHF with mild interstitial edema and moderate-sized bilateral pleural effusions layering posteriorly. Stable left lower lobe atelectasis or pneumonia. Electronically Signed   By: David  Martinique M.D.   On: 04/09/2016 07:35   Dg Chest Port 1 View  04/07/2016  CLINICAL DATA:  Respiratory failure, shortness of breath, sepsis, history of acute and chronic CHF, acute and chronic renal insufficiency, diabetes. EXAM: PORTABLE CHEST 1 VIEW COMPARISON:  Portable chest x-ray of Apr 06, 2016 FINDINGS: The lungs are well-expanded. There are bilateral pleural effusions layering posteriorly. The hemidiaphragms are now partially obscured. The heart is top-normal in size. The central pulmonary vascularity is prominent. The endotracheal tube tip lies 3.8 cm above the carina. The esophagogastric tube tip projects below the inferior margin of the image. The right internal jugular Cordis sheath or dialysis catheter tip projects over the proximal SVC. The small caliber left internal jugular venous catheter tip projects over the midportion of the SVC. IMPRESSION: Some overall worsening of pulmonary interstitial edema with development of bilateral pleural effusions layering posteriorly. The support tubes are in stable position. Electronically Signed    By: David  Martinique M.D.   On: 04/07/2016 07:54   Dg Chest Port 1 View  04/06/2016  CLINICAL DATA:  Central line placement EXAM: PORTABLE CHEST 1 VIEW COMPARISON:  Chest x-rays from earlier same day and 04/05/2016. FINDINGS: Status post left IJ central line placement with tip adequately positioned at the level of the upper SVC. Right IJ catheter stable in position. Endotracheal tube remains well positioned with tip just above the level of the carina. Enteric tube passes below the diaphragm. Mild cardiomegaly is stable. Central pulmonary vascular congestion again noted. There is mild bilateral interstitial edema, improved compared to the chest x-ray from earlier today. No pleural effusion or pneumothorax seen. IMPRESSION: 1. New left IJ central line in place with tip adequately positioned at the level of the upper SVC. Could consider advancing for more optimal radiographic positioning. 2. Remainder of the support apparatus appears stable in position. 3. Improved aeration within each lung suggesting decreased edema (improved fluid status). Central pulmonary vascular congestion and mild perihilar edema persists. 4. No pneumothorax. Electronically Signed   By: Franki Cabot M.D.   On: 04/06/2016 20:56   Dg Chest Port 1 View  04/06/2016  CLINICAL DATA:  Status post central line placement EXAM: PORTABLE CHEST 1 VIEW COMPARISON:  04/05/2016 FINDINGS: Cardiac shadow is stable. Persistent bilateral lower lung opacities. A new nasogastric catheter and endotracheal tube are again seen and stable. A new right jugular catheter is noted. No pneumothorax is seen. Catheter tip is noted in the proximal superior vena cava. IMPRESSION: No pneumothorax following central line placement. Stable bilateral lower lung opacities. Electronically Signed   By: Inez Catalina M.D.   On: 04/06/2016 11:14   Portable Chest Xray  04/05/2016  CLINICAL DATA:  Endotracheal tube placement.  Initial encounter. EXAM: PORTABLE CHEST 1 VIEW  COMPARISON:  Chest radiograph performed 03/14/2016 FINDINGS: The patient's endotracheal tube is seen ending 3-4 cm above the carina. An enteric tube is noted extending below the diaphragm. Worsening bilateral lower lung zone airspace opacification raises concern for significantly worsening multifocal pneumonia, though pulmonary edema might have a similar appearance. No definite pleural effusion or pneumothorax is seen. The cardiomediastinal silhouette is normal in size. No acute osseous abnormalities are identified. IMPRESSION: 1. Endotracheal tube seen ending 3-4 cm above the carina. 2. Worsening bilateral lower lung zone airspace opacification is concerning for significantly worsened multifocal pneumonia,  though pulmonary edema might have a similar appearance. Electronically Signed   By: Garald Balding M.D.   On: 04/05/2016 18:30    ASSESSMENT AND PLAN  1. Acute on chronic diastolic heart failure - he is improving slowly. Continue current meds 2. 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 He will continue CRRT and IV lasix 3. Neuro - he is still not following commands but eyes open with verbal stimulation 4. VDRF - he is improving slowly. Appreciate CCM. Note plans for pressure support ventilation 5. NSTEMI - no evidence of ongoing ischemia. Heparin stopped due to bleeding  Jacia Sickman,M.D.  04/10/2016 9:00 AM

## 2016-04-11 ENCOUNTER — Inpatient Hospital Stay (HOSPITAL_COMMUNITY): Payer: Medicare HMO

## 2016-04-11 LAB — RENAL FUNCTION PANEL
ANION GAP: 9 (ref 5–15)
Albumin: 2.3 g/dL — ABNORMAL LOW (ref 3.5–5.0)
Albumin: 2.4 g/dL — ABNORMAL LOW (ref 3.5–5.0)
Anion gap: 13 (ref 5–15)
BUN: 21 mg/dL — AB (ref 6–20)
BUN: 24 mg/dL — AB (ref 6–20)
CALCIUM: 8.8 mg/dL — AB (ref 8.9–10.3)
CHLORIDE: 103 mmol/L (ref 101–111)
CHLORIDE: 104 mmol/L (ref 101–111)
CO2: 23 mmol/L (ref 22–32)
CO2: 24 mmol/L (ref 22–32)
CREATININE: 2.05 mg/dL — AB (ref 0.61–1.24)
Calcium: 8.5 mg/dL — ABNORMAL LOW (ref 8.9–10.3)
Creatinine, Ser: 2.21 mg/dL — ABNORMAL HIGH (ref 0.61–1.24)
GFR calc Af Amer: 34 mL/min — ABNORMAL LOW (ref >60)
GFR calc non Af Amer: 29 mL/min — ABNORMAL LOW (ref >60)
GFR, EST AFRICAN AMERICAN: 37 mL/min — AB (ref >60)
GFR, EST NON AFRICAN AMERICAN: 32 mL/min — AB (ref >60)
GLUCOSE: 109 mg/dL — AB (ref 65–99)
Glucose, Bld: 97 mg/dL (ref 65–99)
POTASSIUM: 3.8 mmol/L (ref 3.5–5.1)
Phosphorus: 2 mg/dL — ABNORMAL LOW (ref 2.5–4.6)
Phosphorus: 3.2 mg/dL (ref 2.5–4.6)
Potassium: 4.2 mmol/L (ref 3.5–5.1)
SODIUM: 139 mmol/L (ref 135–145)
Sodium: 137 mmol/L (ref 135–145)

## 2016-04-11 LAB — POCT ACTIVATED CLOTTING TIME
ACTIVATED CLOTTING TIME: 198 s
ACTIVATED CLOTTING TIME: 198 s
ACTIVATED CLOTTING TIME: 219 s
ACTIVATED CLOTTING TIME: 183 s
ACTIVATED CLOTTING TIME: 198 s
Activated Clotting Time: 204 seconds
Activated Clotting Time: 209 seconds
Activated Clotting Time: 198 seconds
Activated Clotting Time: 214 seconds
Activated Clotting Time: 209 seconds
Activated Clotting Time: 209 seconds

## 2016-04-11 LAB — GLUCOSE, CAPILLARY
GLUCOSE-CAPILLARY: 117 mg/dL — AB (ref 65–99)
GLUCOSE-CAPILLARY: 124 mg/dL — AB (ref 65–99)
GLUCOSE-CAPILLARY: 120 mg/dL — AB (ref 65–99)
GLUCOSE-CAPILLARY: 112 mg/dL — AB (ref 65–99)
Glucose-Capillary: 122 mg/dL — ABNORMAL HIGH (ref 65–99)
Glucose-Capillary: 126 mg/dL — ABNORMAL HIGH (ref 65–99)

## 2016-04-11 LAB — CBC
HEMATOCRIT: 29.7 % — AB (ref 39.0–52.0)
HEMOGLOBIN: 8.8 g/dL — AB (ref 13.0–17.0)
MCH: 29.9 pg (ref 26.0–34.0)
MCHC: 29.6 g/dL — AB (ref 30.0–36.0)
MCV: 101 fL — ABNORMAL HIGH (ref 78.0–100.0)
Platelets: 209 10*3/uL (ref 150–400)
RBC: 2.94 MIL/uL — ABNORMAL LOW (ref 4.22–5.81)
RDW: 15 % (ref 11.5–15.5)
WBC: 32 10*3/uL — ABNORMAL HIGH (ref 4.0–10.5)

## 2016-04-11 LAB — APTT: aPTT: 83 seconds — ABNORMAL HIGH (ref 24–37)

## 2016-04-11 LAB — MAGNESIUM: Magnesium: 2.7 mg/dL — ABNORMAL HIGH (ref 1.7–2.4)

## 2016-04-11 MED ORDER — FAMOTIDINE IN NACL 20-0.9 MG/50ML-% IV SOLN
20.0000 mg | INTRAVENOUS | Status: DC
  Administered 2016-04-11 – 2016-04-12 (×2): 20 mg via INTRAVENOUS
  Filled 2016-04-11 (×2): qty 50

## 2016-04-11 MED ORDER — MUPIROCIN 2 % EX OINT
1.0000 "application " | TOPICAL_OINTMENT | Freq: Two times a day (BID) | CUTANEOUS | Status: DC
  Administered 2016-04-11 – 2016-04-13 (×4): 1 via NASAL
  Filled 2016-04-11: qty 22

## 2016-04-11 MED ORDER — CHLORHEXIDINE GLUCONATE CLOTH 2 % EX PADS
6.0000 | MEDICATED_PAD | Freq: Every day | CUTANEOUS | Status: DC
  Administered 2016-04-12 – 2016-04-13 (×2): 6 via TOPICAL

## 2016-04-11 MED ORDER — CHLORHEXIDINE GLUCONATE 0.12 % MT SOLN
15.0000 mL | Freq: Two times a day (BID) | OROMUCOSAL | Status: DC
  Administered 2016-04-11 – 2016-04-13 (×5): 15 mL via OROMUCOSAL
  Filled 2016-04-11 (×3): qty 15

## 2016-04-11 MED ORDER — SODIUM PHOSPHATES 45 MMOLE/15ML IV SOLN
30.0000 mmol | Freq: Once | INTRAVENOUS | Status: AC
  Administered 2016-04-11: 30 mmol via INTRAVENOUS
  Filled 2016-04-11: qty 10

## 2016-04-11 MED ORDER — CETYLPYRIDINIUM CHLORIDE 0.05 % MT LIQD
7.0000 mL | Freq: Two times a day (BID) | OROMUCOSAL | Status: DC
  Administered 2016-04-11 – 2016-04-13 (×5): 7 mL via OROMUCOSAL

## 2016-04-11 NOTE — Procedures (Signed)
Extubation Procedure Note  Patient Details:   Name: Troy Young DOB: 1951/07/24 MRN: [id number]   Airway Documentation:   + air leak/cuff test prior to extubation.  Evaluation  O2 sats: stable throughout Complications: No apparent complications Patient did tolerate procedure well. Bilateral Breath Sounds: Clear, Diminished   Yes, pt able to speak.  No distress noted, no stridor noted.  Pt denies SOB.  VSS, sat 100% on 4 lpm [place].  RN at bedside.  Lenna Sciara 04/11/2016, 9:23 AM

## 2016-04-11 NOTE — Progress Notes (Signed)
No longer on tube feeds.  Swallow evaluation for 6/05.  Will d/c lantus for now.  Chesley Mires, MD [facility] Pulmonary/Critical Care 04/11/2016, 12:02 PM Pager:  [phone] After 3pm call: [phone]

## 2016-04-11 NOTE — Progress Notes (Signed)
SLP Cancellation Note  Patient Details Name: Troy Young MRN: [id number] DOB: [birth year]   Cancelled treatment:        Pt extubated 9:15. Spoke with RN. Pt does not have any po meds to be given. SLP will initiate swallow assessment next date.    Houston Siren 04/11/2016, 9:35 AM [phone]

## 2016-04-11 NOTE — Progress Notes (Signed)
Patient ID: Troy Young, male   DOB: 24-Sep-1951, 65 y.o.   MRN: [id number]    Patient Name: Troy Young Date of Encounter: 04/11/2016     Active Problems:   DM2 (diabetes mellitus, type 2) ([place])   HTN (hypertension)   CKD stage 4 due to type 2 diabetes mellitus (HCC)   Elevated troponin   Dyspnea   HLD (hyperlipidemia)   BPH (benign prostatic hyperplasia)   Sepsis ([place])   Type 2 diabetes mellitus with diabetic nephropathy (HCC)   Blindness, legal   Acute on chronic systolic heart failure, NYHA class 4 (HCC)   Leukocytosis   Wheezing   Heart failure (HCC)   Acute respiratory failure with hypoxia (HCC)   AKI (acute kidney injury) ([place])   Aspiration pneumonia (HCC)   NSTEMI (non-ST elevated myocardial infarction) ([place])    SUBJECTIVE  Awake and alert this morning. Denies pain.   CURRENT MEDS . antiseptic oral rinse  7 mL Mouth Rinse 10 times per day  . aspirin  150 mg Rectal Daily  . atorvastatin  80 mg Oral q1800  . chlorhexidine gluconate (SAGE KIT)  15 mL Mouth Rinse BID  . darbepoetin (ARANESP) injection - DIALYSIS  200 mcg Intravenous Q Wed-HD  . feeding supplement (PRO-STAT SUGAR FREE 64)  60 mL Per Tube TID  . ferumoxytol  510 mg Intravenous Weekly  . insulin aspart  0-15 Units Subcutaneous Q4H  . insulin glargine  8 Units Subcutaneous QHS  . pantoprazole (PROTONIX) IV  40 mg Intravenous Q24H  . sodium chloride flush  10-40 mL Intracatheter Q12H  . sodium chloride flush  3 mL Intravenous Q12H  . sodium phosphate  Dextrose 5% IVPB  30 mmol Intravenous Once    OBJECTIVE  Filed Vitals:   04/11/16 0500 04/11/16 0600 04/11/16 0700 04/11/16 0800  BP:      Pulse:  104  104  Temp: 96.8 F (36 C) 97.3 F (36.3 C) 97.3 F (36.3 C) 97.7 F (36.5 C)  TempSrc:    Core (Comment)  Resp: _0 Weight:      SpO2:  100%  100%    Intake/Output Summary (Last 24 hours) at 04/11/16 0824 Last data filed at 04/11/16 0800  Gross per 24 hour  Intake 634.75 ml    Output   4283 ml  Net -3648.25 ml   Filed Weights   04/09/16 0401 04/10/16 0329 04/11/16 0453  Weight: 195 lb 1.7 oz (88.5 kg) 188 lb 4.4 oz (85.4 kg) 177 lb 11.1 oz (80.6 kg)    PHYSICAL EXAM  General: Pleasant, NAD. Neuro: answers questions yes/no,  Moves all extremities spontaneously. HEENT:  Normal  Neck: Supple without bruits or JVD. Lungs:  Resp regular and unlabored, CTA. Heart: Reg tachy no s3, s4, or murmurs. Abdomen: Soft, non-tender, non-distended, BS + x 4.  Extremities: No clubbing, cyanosis or edema. DP/PT/Radials 2+ and equal bilaterally.  Accessory Clinical Findings  CBC  Recent Labs  04/10/16 0420 04/11/16 0530  WBC 25.8* 32.0*  HGB 8.7* 8.8*  HCT 29.1* 29.7*  MCV 99.3 101.0*  PLT 210 237   Basic Metabolic Panel  Recent Labs  04/10/16 0420 04/10/16 1657 04/11/16 0530  NA 136 139 139  K 4.4 3.8 4.2  CL 101 104 103  CO2 _1 GLUCOSE 197* 106* 97  BUN 23* 21* 21*  CREATININE 1.82* 1.86* 2.05*  CALCIUM 8.5* 8.3* 8.8*  MG 2.7*  --  2.7*  PHOS 2.1* 2.3*  2.0*   Liver Function Tests  Recent Labs  04/10/16 1657 04/11/16 0530  ALBUMIN 2.2* 2.3*   No results for input(s): LIPASE, AMYLASE in the last 72 hours. Cardiac Enzymes No results for input(s): CKTOTAL, CKMB, CKMBINDEX, TROPONINI in the last 72 hours. BNP Invalid input(s): POCBNP D-Dimer No results for input(s): DDIMER in the last 72 hours. Hemoglobin A1C No results for input(s): HGBA1C in the last 72 hours. Fasting Lipid Panel No results for input(s): CHOL, HDL, LDLCALC, TRIG, CHOLHDL, LDLDIRECT in the last 72 hours. Thyroid Function Tests No results for input(s): TSH, T4TOTAL, T3FREE, THYROIDAB in the last 72 hours.  Invalid input(s): FREET3  TELE  Sinus tachycardia  Radiology/Studies  Dg Chest 2 View  03/26/2016  CLINICAL DATA:  Acute onset of shortness of breath and vomiting. Dry cough. Initial encounter. EXAM: CHEST  2 VIEW COMPARISON:  Chest radiograph performed  09/28/2015 FINDINGS: The lungs are well-aerated. Vascular congestion is noted. Mild bibasilar opacities may reflect mild interstitial edema. There is no evidence of pleural effusion or pneumothorax. The heart is borderline normal in size. No acute osseous abnormalities are seen. IMPRESSION: Vascular congestion noted. Mild bibasilar opacities may reflect mild interstitial edema. Electronically Signed   By: Garald Balding M.D.   On: 03/20/2016 20:21   US Renal  04/07/2016  CLINICAL DATA:  Acute kidney injury. History of renal insufficiency and hypertension as well diabetes. EXAM: RENAL / URINARY TRACT ULTRASOUND COMPLETE COMPARISON:  Renal ultrasound, 06/11/2015 FINDINGS: Right Kidney: Length: 10.4 cm. Increased parenchymal echogenicity. No renal mass or stone. No hydronephrosis. Left Kidney: Length: 10.9 cm. Increased parenchymal echogenicity. No renal mass or stone. No hydronephrosis. Bladder: Decompressed with a Foley catheter. Note is made of a left pleural effusion. IMPRESSION: 1. No hydronephrosis.  No acute finding. 2. Increased renal parenchymal echogenicity suggesting medical renal disease. Electronically Signed   By: Lajean Manes M.D.   On: 04/07/2016 10:54   Dg Chest Port 1 View  04/11/2016  CLINICAL DATA:  Respiratory failure EXAM: PORTABLE CHEST 1 VIEW COMPARISON:  04/10/2016 FINDINGS: Support devices are stable. Improving aeration in the lung bases with decreasing bibasilar atelectasis. No confluent opacities currently. Heart is upper limits normal in size. No effusions. IMPRESSION: Improving aeration with decreasing bibasilar atelectasis. No acute findings. Electronically Signed   By: Rolm Baptise M.D.   On: 04/11/2016 07:45   Dg Chest Port 1 View  04/10/2016  CLINICAL DATA:  Respiratory failure EXAM: PORTABLE CHEST 1 VIEW COMPARISON:  04/09/2016 FINDINGS: Stable endotracheal tube with tip nearly 2 cm above the carina. Bilateral IJ central line with tips at the SVC. An orogastric tube reaches  the stomach at least. Improved basilar in aeration. Lung volumes remain low. No pneumothorax. Normal heart size. IMPRESSION: 1. Unchanged positioning of tubes and lines. The endotracheal tube tip is ~2 cm above the carina. 2. Improved basilar aeration. Electronically Signed   By: Monte Fantasia M.D.   On: 04/10/2016 06:56   Dg Chest Port 1 View  04/09/2016  CLINICAL DATA:  Respiratory failure, NSTEMI, diabetes, chronic renal insufficiency, sepsis EXAM: PORTABLE CHEST 1 VIEW COMPARISON:  Portable chest x-ray of Apr 07, 2016 FINDINGS: The lungs are slightly less well inflated today. There are bilateral pleural effusions layering posteriorly. The interstitial markings are coarse bilaterally but stable. The cardiac silhouette is mildly enlarged the pulmonary vascularity is engorged and indistinct. The endotracheal tube tip lies 1.9 cm above the carina. The esophagogastric tube tip and proximal port lie below the GE junction. The  right internal jugular Cordis sheath and the left internal jugular venous catheter tip projects over the proximal SVC. IMPRESSION: CHF with mild interstitial edema and moderate-sized bilateral pleural effusions layering posteriorly. Stable left lower lobe atelectasis or pneumonia. Electronically Signed   By: David  Martinique M.D.   On: 04/09/2016 07:35   Dg Chest Port 1 View  04/07/2016  CLINICAL DATA:  Respiratory failure, shortness of breath, sepsis, history of acute and chronic CHF, acute and chronic renal insufficiency, diabetes. EXAM: PORTABLE CHEST 1 VIEW COMPARISON:  Portable chest x-ray of Apr 06, 2016 FINDINGS: The lungs are well-expanded. There are bilateral pleural effusions layering posteriorly. The hemidiaphragms are now partially obscured. The heart is top-normal in size. The central pulmonary vascularity is prominent. The endotracheal tube tip lies 3.8 cm above the carina. The esophagogastric tube tip projects below the inferior margin of the image. The right internal jugular  Cordis sheath or dialysis catheter tip projects over the proximal SVC. The small caliber left internal jugular venous catheter tip projects over the midportion of the SVC. IMPRESSION: Some overall worsening of pulmonary interstitial edema with development of bilateral pleural effusions layering posteriorly. The support tubes are in stable position. Electronically Signed   By: David  Martinique M.D.   On: 04/07/2016 07:54   Dg Chest Port 1 View  04/06/2016  CLINICAL DATA:  Central line placement EXAM: PORTABLE CHEST 1 VIEW COMPARISON:  Chest x-rays from earlier same day and 04/05/2016. FINDINGS: Status post left IJ central line placement with tip adequately positioned at the level of the upper SVC. Right IJ catheter stable in position. Endotracheal tube remains well positioned with tip just above the level of the carina. Enteric tube passes below the diaphragm. Mild cardiomegaly is stable. Central pulmonary vascular congestion again noted. There is mild bilateral interstitial edema, improved compared to the chest x-ray from earlier today. No pleural effusion or pneumothorax seen. IMPRESSION: 1. New left IJ central line in place with tip adequately positioned at the level of the upper SVC. Could consider advancing for more optimal radiographic positioning. 2. Remainder of the support apparatus appears stable in position. 3. Improved aeration within each lung suggesting decreased edema (improved fluid status). Central pulmonary vascular congestion and mild perihilar edema persists. 4. No pneumothorax. Electronically Signed   By: Franki Cabot M.D.   On: 04/06/2016 20:56   Dg Chest Port 1 View  04/06/2016  CLINICAL DATA:  Status post central line placement EXAM: PORTABLE CHEST 1 VIEW COMPARISON:  04/05/2016 FINDINGS: Cardiac shadow is stable. Persistent bilateral lower lung opacities. A new nasogastric catheter and endotracheal tube are again seen and stable. A new right jugular catheter is noted. No pneumothorax is  seen. Catheter tip is noted in the proximal superior vena cava. IMPRESSION: No pneumothorax following central line placement. Stable bilateral lower lung opacities. Electronically Signed   By: Inez Catalina M.D.   On: 04/06/2016 11:14   Portable Chest Xray  04/05/2016  CLINICAL DATA:  Endotracheal tube placement.  Initial encounter. EXAM: PORTABLE CHEST 1 VIEW COMPARISON:  Chest radiograph performed 03/20/2016 FINDINGS: The patient's endotracheal tube is seen ending 3-4 cm above the carina. An enteric tube is noted extending below the diaphragm. Worsening bilateral lower lung zone airspace opacification raises concern for significantly worsening multifocal pneumonia, though pulmonary edema might have a similar appearance. No definite pleural effusion or pneumothorax is seen. The cardiomediastinal silhouette is normal in size. No acute osseous abnormalities are identified. IMPRESSION: 1. Endotracheal tube seen ending 3-4 cm above the  carina. 2. Worsening bilateral lower lung zone airspace opacification is concerning for significantly worsened multifocal pneumonia, though pulmonary edema might have a similar appearance. Electronically Signed   By: Garald Balding M.D.   On: 04/05/2016 18:30    ASSESSMENT AND PLAN  1. Acute respiratory failure - he appears to be improving. Continue CVVHD.  2. Encephalopathy - he is awake and responding to verbal commands 3. Acute on chronic diastolic heart failure - he is making minimal urine but his fluid status appears to be improving. 4. Acute on chronic renal failure - his creatinine is going up and urine output down.   Gregg Taylor,M.D.  04/11/2016 8:24 AM

## 2016-04-11 NOTE — Progress Notes (Signed)
Subjective: Interval History:  Trial breathing, had N,V unrelated  Objective: Vital signs in last 24 hours: Temp:  [96.3 F (35.7 C)-100 F (37.8 C)] 97.3 F (36.3 C) (06/04 0700) Pulse Rate:  [102-118] 104 (06/04 0600) Resp:  [17-31] 17 (06/04 0700) BP: (84-131)/(53-71) 98/63 mmHg (06/03 2000) SpO2:  [96 %-100 %] 100 % (06/04 0600) Arterial Line BP: (96-149)/(42-73) 121/53 mmHg (06/04 0700) FiO2 (%):  [40 %] 40 % (06/04 0400) Weight:  [80.6 kg (177 lb 11.1 oz)] 80.6 kg (177 lb 11.1 oz) (06/04 0453) Weight change: -4.8 kg (-10 lb 9.3 oz)  Intake/Output from previous day: 06/03 0701 - 06/04 0700 In: 669.8 [I.V.:36; NG/GT:173.8; IV Piggyback:460] Out: 4303 [Urine:75; Emesis/NG output:550; Stool:200] Intake/Output this shift:    General appearance: moves to stim, not clearly noncoherent Neck: RIJ cath Resp: diminished breath sounds bilaterally and rales bibasilar Cardio: S1, S2 normal and systolic murmur: holosystolic 2/6, blowing at apex GI: bs but decreased Extremitie2+ edemaedema 2+ edema  Lab Results:  Recent Labs  04/09/16 0431 04/10/16 0420  WBC 22.2* 25.8*  HGB 9.1* 8.7*  HCT 31.0* 29.1*  PLT 215 210   BMET:  Recent Labs  04/10/16 0420 04/10/16 1657  NA 136 139  K 4.4 3.8  CL 101 104  CO2 26 24  GLUCOSE 197* 106*  BUN 23* 21*  CREATININE 1.82* 1.86*  CALCIUM 8.5* 8.3*   No results for input(s): PTH in the last 72 hours. Iron Studies: No results for input(s): IRON, TIBC, TRANSFERRIN, FERRITIN in the last 72 hours.  Studies/Results: Dg Chest Port 1 View  04/10/2016  CLINICAL DATA:  Respiratory failure EXAM: PORTABLE CHEST 1 VIEW COMPARISON:  04/09/2016 FINDINGS: Stable endotracheal tube with tip nearly 2 cm above the carina. Bilateral IJ central line with tips at the SVC. An orogastric tube reaches the stomach at least. Improved basilar in aeration. Lung volumes remain low. No pneumothorax. Normal heart size. IMPRESSION: 1. Unchanged positioning of tubes  and lines. The endotracheal tube tip is ~2 cm above the carina. 2. Improved basilar aeration. Electronically Signed   By: Monte Fantasia M.D.   On: 04/10/2016 06:56    I have reviewed the patient's current medications.  Assessment/Plan: 1 CKD 4/AKI  Still periph edema,  But low CVP, lower Net neg.  Acid/base/K ok.   2 Ileus.  Cause ? 3 Anemia esa/Fe 4 HPTH 5 DM controlle 6 Resp failure  Per CCM 7 Blind 8 Pneu P CRRT, lower net neg, Phos repletion.      LOS: 7 days   Troy Young 04/11/2016,7:18 AM   =

## 2016-04-11 NOTE — Progress Notes (Signed)
PULMONARY / CRITICAL CARE MEDICINE   Name: Troy Young MRN: [id number] DOB: 1951-04-28    ADMISSION DATE:  04/07/2016  CHIEF COMPLAINT:  Difficulty Breathing  SUBJECTIVE: Tolerating pressure support.  Still on CRRT.  VITAL SIGNS: BP 98/63 mmHg  Pulse 104  Temp(Src) 97.7 F (36.5 C) (Core (Comment))  Resp 21  Ht 5\' 8"  (1.727 m)  Wt 177 lb 11.1 oz (80.6 kg)  BMI 27.02 kg/m2  SpO2 100%  HEMODYNAMICS: CVP:  [3 mmHg-5 mmHg] 4 mmHg  VENTILATOR SETTINGS: Vent Mode:  [-] PRVC FiO2 (%):  [40 %] 40 % Set Rate:  [14 bmp] 14 bmp Vt Set:  [550 mL] 550 mL PEEP:  [5 cmH20] 5 cmH20 Pressure Support:  [5 cmH20] 5 cmH20 Plateau Pressure:  [16 cmH20] 16 cmH20  INTAKE / OUTPUT: I/O last 3 completed shifts: In: 1449.8 [I.V.:36; NG/GT:753.8; IV Piggyback:660] Out: [id number] [Urine:185; Emesis/NG output:550; [id number]; Stool:200]  PHYSICAL EXAMINATION: General: on vent, CRRT Neuro: follows commands, moves extremities HEENT: ETT in place Cardiac: regular, no murmur Chest: no wheeze Abd: soft, non tender, mild distention Ext: no edema Skin: no rashes  LABS:  BMET  Recent Labs Lab 04/10/16 0420 04/10/16 1657 04/11/16 0530  NA 136 139 139  K 4.4 3.8 4.2  CL 101 104 103  CO2 26 24 23   BUN 23* 21* 21*  CREATININE 1.82* 1.86* 2.05*  GLUCOSE 197* 106* 97   Electrolytes  Recent Labs Lab 04/09/16 0431  04/10/16 0420 04/10/16 1657 04/11/16 0530  CALCIUM  --   < > 8.5* 8.3* 8.8*  MG 2.5*  --  2.7*  --  2.7*  PHOS  --   < > 2.1* 2.3* 2.0*  < > = values in this interval not displayed.  CBC  Recent Labs Lab 04/09/16 0431 04/10/16 0420 04/11/16 0530  WBC 22.2* 25.8* 32.0*  HGB 9.1* 8.7* 8.8*  HCT 31.0* 29.1* 29.7*  PLT 215 210 209    Coag's  Recent Labs Lab 04/05/16 0440 04/07/16 0355  04/09/16 0431 04/10/16 0420 04/11/16 0530  APTT >200* >200*  < > >200* 136* 83*  INR 1.81* 2.19*  --   --   --   --   < > = values in this interval not displayed.  Sepsis  Markers  Recent Labs Lab 04/05/16 0103 04/05/16 0448 04/05/16 1600 04/06/16 0227 04/06/16 1305 04/07/16 0355  LATICACIDVEN 4.22* 4.2*  --   --  1.6  --   PROCALCITON  --   --  3.96 6.50  --  3.06    ABG  Recent Labs Lab 04/07/16 0438 04/07/16 0902 04/08/16 1622  PHART 7.575* 7.432 7.314*  PCO2ART 25.3* 35.7 54.5*  PO2ART 130.0* 72.0* 60.0*    Liver Enzymes  Recent Labs Lab 04/03/2016 2038 04/05/16 0440  04/10/16 0420 04/10/16 1657 04/11/16 0530  AST 27 42*  --   --   --   --   ALT 17 32  --   --   --   --   ALKPHOS 70 61  --   --   --   --   BILITOT 1.0 1.2  --   --   --   --   ALBUMIN 3.3* 2.9*  < > 2.2* 2.2* 2.3*  < > = values in this interval not displayed.  Cardiac Enzymes  Recent Labs Lab 04/05/16 0440 04/05/16 0809 04/05/16 1320  TROPONINI 1.33* 2.00* 6.41*    Glucose  Recent Labs Lab 04/10/16 0847 04/10/16 1217 04/10/16 1539  04/10/16 1936 04/11/16 0019 04/11/16 0402  GLUCAP 177* 170* 90 121* 122* 117*    Imaging Dg Chest Port 1 View  04/11/2016  CLINICAL DATA:  Respiratory failure EXAM: PORTABLE CHEST 1 VIEW COMPARISON:  04/10/2016 FINDINGS: Support devices are stable. Improving aeration in the lung bases with decreasing bibasilar atelectasis. No confluent opacities currently. Heart is upper limits normal in size. No effusions. IMPRESSION: Improving aeration with decreasing bibasilar atelectasis. No acute findings. Electronically Signed   By: Rolm Baptise M.D.   On: 04/11/2016 07:45     STUDIES:  5/29 Echo >> EF 40 to 45%, PAS 58 mmHg, mod MR, mod TR  CULTURES: Blood 5/29 (pending)>> (-) Urine 5/29 (pending)>> (-) Sputum 5/29>> (-)  ANTIBIOTICS: Ceftriaxone x1 dose 5/29 >> Vanc 5/29 >> 6/1 Pip-tazo 5/29 >> 6/1 Unasyn 6/1 >> 6/3  SIGNIFICANT EVENTS: 5/28 Bolused 3L in ED 5/29 intubated 5/30 worsening renal failure, on pressors, hypekalemia  LINES/TUBES: 5/29 ETT>> 6/4 5/30 rt rad a line>> 5/30 rt i j HD cath>> 5/30 L TLC  >>  DISCUSSION: 65 yo male with progressive dyspnea, edema from acute on chronic systolic CHF and aspiration pneumonia.  ASSESSMENT / PLAN:  PULMONARY A: Acute Hypoxemic Resp Failure due to Pulmonary Edema, Demand Ischemia, Asp PNA P:   Proceed with extubation trial 6/4 F/u CXR intermittently  CARDIOVASCULAR A:  Acute exacerbation of ischemic systolic cardiomyopathy NSTEMI  H/O HTN P:  Heparin infusion was dc'd 2/2 bleeding/anemia Cards consulted and following  RENAL A:   Acute on chronic kidney injury CKD 4 (baseline) Hyperkalemia P:   Continue CVVH per renal Correct electrolytes  GASTROINTESTINAL A:   Nutrition. P:   Speech to assess swallowing after extubation Protonix  HEMATOLOGIC A:   Anemia, bleeding, hematuria P: S/P 1 uPRBC 6/1 > hb stable since off heparin Heparin dc'd 2/2 anemia/bleeding SCDs  INFECTIOUS A:   Asp pna >> completed Abx 6/03. P:   Monitor clinically  ENDOCRINE A:   DM2. P:   Continue with SSI + basal (8 U nightly of lantus)  NEUROLOGIC A:   Acute metabolic encephalopathy >> Off fentanyl since 1741 on 04/09/16, versed since 0900 on 04/08/16 Blind. P:   Monitor mental status   CC time 32 minutes.  Chesley Mires, MD [facility] Pulmonary/Critical Care 04/11/2016, 8:36 AM Pager:  [phone] After 3pm call: [phone]

## 2016-04-12 DIAGNOSIS — A419 Sepsis, unspecified organism: Secondary | ICD-10-CM | POA: Insufficient documentation

## 2016-04-12 DIAGNOSIS — R6520 Severe sepsis without septic shock: Secondary | ICD-10-CM

## 2016-04-12 LAB — CBC
HCT: 28.6 % — ABNORMAL LOW (ref 39.0–52.0)
HEMOGLOBIN: 8.5 g/dL — AB (ref 13.0–17.0)
MCH: 29.7 pg (ref 26.0–34.0)
MCHC: 29.7 g/dL — AB (ref 30.0–36.0)
MCV: 100 fL (ref 78.0–100.0)
Platelets: 213 10*3/uL (ref 150–400)
RBC: 2.86 MIL/uL — AB (ref 4.22–5.81)
RDW: 15.4 % (ref 11.5–15.5)
WBC: 32.8 10*3/uL — AB (ref 4.0–10.5)

## 2016-04-12 LAB — RENAL FUNCTION PANEL
ALBUMIN: 2.5 g/dL — AB (ref 3.5–5.0)
ANION GAP: 13 (ref 5–15)
Albumin: 2.4 g/dL — ABNORMAL LOW (ref 3.5–5.0)
Anion gap: 11 (ref 5–15)
BUN: 25 mg/dL — ABNORMAL HIGH (ref 6–20)
BUN: 45 mg/dL — AB (ref 6–20)
CALCIUM: 8.6 mg/dL — AB (ref 8.9–10.3)
CHLORIDE: 105 mmol/L (ref 101–111)
CO2: 22 mmol/L (ref 22–32)
CO2: 22 mmol/L (ref 22–32)
CREATININE: 3.75 mg/dL — AB (ref 0.61–1.24)
Calcium: 8.6 mg/dL — ABNORMAL LOW (ref 8.9–10.3)
Chloride: 104 mmol/L (ref 101–111)
Creatinine, Ser: 2.34 mg/dL — ABNORMAL HIGH (ref 0.61–1.24)
GFR calc Af Amer: 18 mL/min — ABNORMAL LOW (ref >60)
GFR calc non Af Amer: 27 mL/min — ABNORMAL LOW (ref >60)
GFR, EST AFRICAN AMERICAN: 32 mL/min — AB (ref >60)
GFR, EST NON AFRICAN AMERICAN: 15 mL/min — AB (ref >60)
GLUCOSE: 115 mg/dL — AB (ref 65–99)
Glucose, Bld: 141 mg/dL — ABNORMAL HIGH (ref 65–99)
PHOSPHORUS: 2.9 mg/dL (ref 2.5–4.6)
Phosphorus: 3.2 mg/dL (ref 2.5–4.6)
Potassium: 4.1 mmol/L (ref 3.5–5.1)
Potassium: 4 mmol/L (ref 3.5–5.1)
SODIUM: 139 mmol/L (ref 135–145)
Sodium: 138 mmol/L (ref 135–145)

## 2016-04-12 LAB — C DIFFICILE QUICK SCREEN W PCR REFLEX
C Diff antigen: NEGATIVE
C Diff interpretation: NEGATIVE
C Diff toxin: NEGATIVE

## 2016-04-12 LAB — GLUCOSE, CAPILLARY
GLUCOSE-CAPILLARY: 128 mg/dL — AB (ref 65–99)
Glucose-Capillary: 122 mg/dL — ABNORMAL HIGH (ref 65–99)
Glucose-Capillary: 135 mg/dL — ABNORMAL HIGH (ref 65–99)
Glucose-Capillary: 96 mg/dL (ref 65–99)
Glucose-Capillary: 141 mg/dL — ABNORMAL HIGH (ref 65–99)

## 2016-04-12 LAB — POCT ACTIVATED CLOTTING TIME
ACTIVATED CLOTTING TIME: 188 s
ACTIVATED CLOTTING TIME: 219 s
Activated Clotting Time: 183 seconds
Activated Clotting Time: 188 seconds
Activated Clotting Time: 204 seconds
Activated Clotting Time: 209 seconds
Activated Clotting Time: 229 seconds
Activated Clotting Time: 214 seconds

## 2016-04-12 LAB — POCT I-STAT 3, ART BLOOD GAS (G3+)
ACID-BASE EXCESS: 2 mmol/L (ref 0.0–2.0)
BICARBONATE: 24.8 meq/L — AB (ref 20.0–24.0)
O2 SAT: 98 %
PH ART: 7.495 — AB (ref 7.350–7.450)
TCO2: 26 mmol/L (ref 0–100)
pCO2 arterial: 32.4 mmHg — ABNORMAL LOW (ref 35.0–45.0)
pO2, Arterial: 100 mmHg (ref 80.0–100.0)

## 2016-04-12 LAB — MAGNESIUM: MAGNESIUM: 2.8 mg/dL — AB (ref 1.7–2.4)

## 2016-04-12 LAB — APTT: APTT: 136 s — AB (ref 24–37)

## 2016-04-12 MED ORDER — DEXTROSE 5 % IV SOLN
1.0000 g | Freq: Once | INTRAVENOUS | Status: AC
  Administered 2016-04-12: 1 g via INTRAVENOUS
  Filled 2016-04-12: qty 1

## 2016-04-12 MED ORDER — NEPRO/CARBSTEADY PO LIQD
237.0000 mL | Freq: Two times a day (BID) | ORAL | Status: DC
  Administered 2016-04-13: 237 mL via ORAL
  Filled 2016-04-12 (×7): qty 237

## 2016-04-12 MED ORDER — ONDANSETRON HCL 4 MG/2ML IJ SOLN
4.0000 mg | Freq: Four times a day (QID) | INTRAMUSCULAR | Status: DC | PRN
  Administered 2016-04-12 – 2016-04-13 (×2): 4 mg via INTRAVENOUS
  Filled 2016-04-12 (×2): qty 2

## 2016-04-12 MED ORDER — METRONIDAZOLE IN NACL 5-0.79 MG/ML-% IV SOLN: 500.0000 mg | Freq: Three times a day (TID) | INTRAVENOUS | Status: DC

## 2016-04-12 MED ORDER — SODIUM CHLORIDE 0.9 % IV SOLN
1500.0000 mg | Freq: Once | INTRAVENOUS | Status: AC
  Administered 2016-04-12: 1500 mg via INTRAVENOUS
  Filled 2016-04-12: qty 1500

## 2016-04-12 MED ORDER — ACETAMINOPHEN 650 MG RE SUPP: 650.0000 mg | Freq: Four times a day (QID) | RECTAL | Status: DC | PRN

## 2016-04-12 MED ORDER — CEFEPIME HCL 1 G IJ SOLR
1.0000 g | INTRAMUSCULAR | Status: DC
  Filled 2016-04-12: qty 1

## 2016-04-12 NOTE — Care Management Important Message (Signed)
Important Message  Patient Details  Name: Troy Young MRN: [id number] Date of Birth: 02-28-1951   Medicare Important Message Given:  Yes    Loann Quill 04/12/2016, 11:41 AM

## 2016-04-12 NOTE — Progress Notes (Signed)
Pharmacy Antibiotic Note  Troy Young is a 65 y.o. male admitted on 03/30/2016 with pneumonia, now with worsening respiratory status and new fever along with increased WBC and changes in mental status.  Pharmacy has been consulted for vancomycin and cefepime dosing.  Of note, patient was on CRRT with plans to stop and transition to iHD once current filter clots- turned off ~1000 this morning.   Plan: -vancomycin load with 20mg /kg = 1500mg  IV x1 -cefepime 1g IV q24h- give first dose now, then schedule for 2000 daily -Flagyl 500mg  IV q8h per MD- ok  -follow HD plans, UOP, clinical progression, c/s  Height: 5\' 8"  (172.7 cm) Weight: 170 lb 10.2 oz (77.4 kg) IBW/kg (Calculated) : 68.4  Temp (24hrs), Avg:98.4 F (36.9 C), Min:97.5 F (36.4 C), Max:100.6 F (38.1 C)   Recent Labs Lab 04/06/16 1305  04/08/16 0820  04/09/16 0431  04/10/16 0420 04/10/16 1657 04/11/16 0530 04/11/16 1607 04/12/16 0435  WBC  --   < > 14.4*  --  22.2*  --  25.8*  --  32.0*  --  32.8*  CREATININE 5.92*  < >  --   < >  --   < > 1.82* 1.86* 2.05* 2.21* 2.34*  LATICACIDVEN 1.6  --   --   --   --   --   --   --   --   --   --   < > = values in this interval not displayed.  Estimated Creatinine Clearance: 30 mL/min (by C-G formula based on Cr of 2.34).    Allergies  Allergen Reactions  . Pork-Derived Products Other (See Comments)    Pt does not eat pork or pork products    Antimicrobials this admission: Vancomycin 5/29>>6/1; 6/5>> Zosyn 5/29>>6/1 Unasyn 6/1>6/3 Doxycycline 5/30>6/1 Cefepime 6/5>> Flagyl 6/5>>  Microbiology results: 5/28 BCx - ngtd 5/29 UCx - multiple species 5/29 sputum cx - no organisms seen 5/29 MRSA PCR - positive RMSF negative 6/5 CDiff: ordered 6/5 BCx: ordered   Thank you for allowing pharmacy to be a part of this patient's care.  Carnell Beavers D. Marquesa Rath, PharmD, BCPS Clinical Pharmacist Pager: [phone] 04/12/2016 12:39 PM

## 2016-04-12 NOTE — Progress Notes (Signed)
PULMONARY / CRITICAL CARE MEDICINE   Name: Troy Young MRN: [id number] DOB: Apr 15, 1951    ADMISSION DATE:  03/27/2016  CHIEF COMPLAINT:  Difficulty Breathing  SUBJECTIVE: No events overnight, tolerated extubation well.  VITAL SIGNS: BP 117/51 mmHg  Pulse 85  Temp(Src) 98.2 F (36.8 C) (Oral)  Resp 24  Ht 5\' 8"  (1.727 m)  Wt 77.4 kg (170 lb 10.2 oz)  BMI 25.95 kg/m2  SpO2 100%  HEMODYNAMICS: CVP:  [2 mmHg] 2 mmHg  VENTILATOR SETTINGS:    INTAKE / OUTPUT: I/O last 3 completed shifts: In: 56 [IV Piggyback:308] Out: [id number] [Emesis/NG output:250; MZ:[id number]; Stool:200]  PHYSICAL EXAMINATION: General: Alert and interactive, moving ext but weak. Neuro: follows commands, moves extremities HEENT: [place]/AT, PERRL, EOM-I and MMM. Cardiac: regular, no murmur Chest: no wheeze Abd: soft, non tender, mild distention Ext: no edema Skin: no rashes  LABS:  BMET  Recent Labs Lab 04/11/16 0530 04/11/16 1607 04/12/16 0435  NA 139 137 139  K 4.2 3.8 4.1  CL 103 104 104  CO2 23 24 22   BUN 21* 24* 25*  CREATININE 2.05* 2.21* 2.34*  GLUCOSE 97 109* 115*   Electrolytes  Recent Labs Lab 04/10/16 0420  04/11/16 0530 04/11/16 1607 04/12/16 0435  CALCIUM 8.5*  < > 8.8* 8.5* 8.6*  MG 2.7*  --  2.7*  --  2.8*  PHOS 2.1*  < > 2.0* 3.2 2.9  < > = values in this interval not displayed.  CBC  Recent Labs Lab 04/10/16 0420 04/11/16 0530 04/12/16 0435  WBC 25.8* 32.0* 32.8*  HGB 8.7* 8.8* 8.5*  HCT 29.1* 29.7* 28.6*  PLT 210 209 213   Coag's  Recent Labs Lab 04/07/16 0355  04/10/16 0420 04/11/16 0530 04/12/16 0435  APTT >200*  < > 136* 83* 136*  INR 2.19*  --   --   --   --   < > = values in this interval not displayed.  Sepsis Markers  Recent Labs Lab 04/05/16 1600 04/06/16 0227 04/06/16 1305 04/07/16 0355  LATICACIDVEN  --   --  1.6  --   PROCALCITON 3.96 6.50  --  3.06   ABG  Recent Labs Lab 04/07/16 0438 04/07/16 0902 04/08/16 1622  PHART  7.575* 7.432 7.314*  PCO2ART 25.3* 35.7 54.5*  PO2ART 130.0* 72.0* 60.0*   Liver Enzymes  Recent Labs Lab 04/11/16 0530 04/11/16 1607 04/12/16 0435  ALBUMIN 2.3* 2.4* 2.5*    Cardiac Enzymes  Recent Labs Lab 04/05/16 1320  TROPONINI 6.41*    Glucose  Recent Labs Lab 04/11/16 1203 04/11/16 1556 04/11/16 1948 04/12/16 0023 04/12/16 0409 04/12/16 0749  GLUCAP 126* 120* 112* 135* 122* 96    Imaging I reviewed CXR myself, mild pulmonary edema.  STUDIES:  5/29 Echo >> EF 40 to 45%, PAS 58 mmHg, mod MR, mod TR  CULTURES: Blood 5/29 (pending)>> (-) Urine 5/29 (pending)>> (-) Sputum 5/29>> (-)  ANTIBIOTICS: Ceftriaxone x1 dose 5/29 >> Vanc 5/29 >> 6/1 Pip-tazo 5/29 >> 6/1 Unasyn 6/1 >> 6/3  SIGNIFICANT EVENTS: 5/28 Bolused 3L in ED 5/29 intubated 5/30 worsening renal failure, on pressors, hypekalemia  LINES/TUBES: 5/29 ETT>> 6/4 5/30 rt rad a line>> 5/30 rt i j HD cath>> 5/30 L TLC >>  DISCUSSION: 65 yo male with progressive dyspnea, edema from acute on chronic systolic CHF and aspiration pneumonia.  ASSESSMENT / PLAN:  PULMONARY A: Acute Hypoxemic Resp Failure due to Pulmonary Edema, Demand Ischemia, Asp PNA P:   IS per RT protocol.  Titrate O2 for sat of 88-92%. May need ambulatory desaturation prior to discharge for home O2. Ambulate.  CARDIOVASCULAR A:  Acute exacerbation of ischemic systolic cardiomyopathy NSTEMI  H/O HTN P:  Heparin infusion was dc'd 2/2 bleeding/anemia Cards consulted and following  RENAL A:   Acute on chronic kidney injury CKD 4 (baseline) Hyperkalemia P:   D/C CVVH and transition of standard HD. Correct electrolytes as indicated. BMET in AM.  GASTROINTESTINAL A:   Nutrition. P:   Diet dysphagia 3 per speech pathology. Protonix.  HEMATOLOGIC A:   Anemia, bleeding, hematuria P: Heparin dc'd 2/2 anemia/bleeding SCDs  INFECTIOUS A:   Asp pna >> completed Abx 6/03. P:   Monitor  clinically  ENDOCRINE A:   DM2. P:   Continue with SSI + basal (8 U nightly of lantus)  NEUROLOGIC A:   Acute metabolic encephalopathy >> Off fentanyl since 1741 on 04/09/16, versed since 0900 on 04/08/16 Blind. P:   Monitor mental status Minimize sedating medications  Discussed with bedside RN and TRH-MD.  Will transfer to SDU and to [facility] service with PCCM off 04/13/2016.  Rush Farmer, M.D. [facility] Pulmonary/Critical Care Medicine. Pager: [phone]. After hours pager: [phone].

## 2016-04-12 NOTE — Evaluation (Signed)
Clinical/Bedside Swallow Evaluation Patient Details  Name: Troy Young MRN: [id number] Date of Birth: 06/22/1951  Today's Date: 04/12/2016 Time: SLP Start Time (ACUTE ONLY): 0830 SLP Stop Time (ACUTE ONLY): 0855 SLP Time Calculation (min) (ACUTE ONLY): 25 min  Past Medical History:  Past Medical History  Diagnosis Date  . 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  Marland Kitchen DM2 (diabetes mellitus, type 2) ([place])   . Hypertension   . Hyperlipidemia   . BPH (benign prostatic hyperplasia)   . Spontaneous pneumothorax     2  . Renal insufficiency   . Anemia   . Cancer ([place]) 11/08/2013    colon cancer (malignant polyp) removed  . CHF (congestive heart failure) ([place])   . Diabetic retinopathy ([place])     legally blind since 1995.   Past Surgical History:  Past Surgical History  Procedure Laterality Date  . Polyp removed    . Eye surgery    . Cataract extraction     HPI:  Troy Young is an 65 y.o. 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 He states that his symptoms began on Friday after he had food from [place] [employer]. He has also complained of generalized edema, and is up about 15 lbs. In the ED, he was found to have a low-grade fever, and was treated for presumed sepsis with multiple liters of IVF, and his lactic acid rose from mid-2s to mid-4s. He denies subjective fevers, chills, etc.   Assessment / Plan / Recommendation Clinical Impression  Pt seen for clinical assessment of swallow following extubation. Pt generally performed [facility], although he was somewhat lethargic and required cueing to participate. Recommend: Dys 1 with thins until pt more alert. Pt should be upright with all PO and adequately alert. Encourage small bites/sips and meds whole with puree. Will follow.    Aspiration Risk  Mild aspiration risk    Diet  Recommendation Dysphagia 1 (Puree);Thin liquid   Liquid Administration via: Cup;Straw Medication Administration: Whole meds with puree Supervision: Full supervision/cueing for compensatory strategies Compensations: Slow rate;Small sips/bites Postural Changes: Seated upright at 90 degrees    Other  Recommendations Oral Care Recommendations: Oral care QID   Follow up Recommendations   (TBD)    Frequency and Duration min 2x/week  1 week       Prognosis Prognosis for Safe Diet Advancement: Good      Swallow Study   General Date of Onset: 04/05/16 HPI: Troy Young is an 65 y.o. 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 He states that his symptoms began on Friday after he had food from [employer]. He has also complained of generalized edema, and is up about 15 lbs. In the ED, he was found to have a low-grade fever, and was treated for presumed sepsis with multiple liters of IVF, and his lactic acid rose from mid-2s to mid-4s. He denies subjective fevers, chills, etc. Type of Study: Bedside Swallow Evaluation Previous Swallow Assessment: none known Diet Prior to this Study: NPO Temperature Spikes Noted: No Respiratory Status: Room air History of Recent Intubation: Yes Length of Intubations (days): 7 days Date extubated: 04/11/16 Behavior/Cognition: Cooperative;Pleasant mood;Lethargic/Drowsy;Requires cueing Oral Cavity Assessment: Within Functional Limits Oral Care Completed by SLP: No  Oral Cavity - Dentition: Dentures, top;Dentures, bottom (ill-fitting- require fixative) Vision: Impaired for self-feeding Self-Feeding Abilities: Total assist Patient Positioning: Upright in bed Baseline Vocal Quality: Low vocal intensity Volitional Swallow: Unable to elicit    Oral/Motor/Sensory Function Overall Oral Motor/Sensory Function: Within functional  limits   Ice Chips Ice chips: Within functional limits Presentation: Spoon   Thin Liquid Thin Liquid: Within functional limits Presentation: Spoon;Straw    Nectar Thick     Honey Thick     Puree Puree: Within functional limits Presentation: Spoon   Solid   GO   Solid: Not tested        Troy Bergamo MA, CCC-SLP 04/12/2016,9:09 AM

## 2016-04-12 NOTE — Progress Notes (Signed)
PATIENT ID:  [id number] with hypertension and diabetes here with acute on chronic kidney disease requiring CVVHD, hypoxic respiratory failure due to aspiration pneumonia and acute on chronic systolic (LVEF [id number]) and diastolic heart failure.    SUBJECTIVE:  Feels well. Reports that he is sleepy.   PHYSICAL EXAM Filed Vitals:   04/12/16 0400 04/12/16 0700 04/12/16 0751 04/12/16 0800  BP: 117/51     Pulse: 85     Temp: 97.9 F (36.6 C)  98.2 F (36.8 C)   TempSrc: Oral  Oral   Resp: 33 21  24  Weight:      SpO2: 100%   100%   General:  Chronically ill-appearing. No acute distress Neck:  Difficult to assess JVP.  He has lines in both IJs.  Lungs:  Good auscultation bilaterally. No crackles, rhonchi or wheezes. Heart:  Regular rate and rhythm. No murmurs, rubs, or gallops. Abdomen:  Soft. Nontender, nondistended. Active bowel sounds. Extremities:  Warm and well-perfused. No edema.  LABS: Lab Results  Component Value Date   TROPONINI 6.41* 04/05/2016   Results for orders placed or performed during the hospital encounter of 03/14/2016 (from the past 24 hour(s))  Glucose, capillary     Status: Abnormal   Collection Time: 04/11/16 12:03 PM  Result Value Ref Range   Glucose-Capillary 126 (H) 65 - 99 mg/dL   Comment 1 Arterial Specimen   POCT Activated clotting time     Status: None   Collection Time: 04/11/16 12:19 PM  Result Value Ref Range   Activated Clotting Time 183 seconds  POCT Activated clotting time     Status: None   Collection Time: 04/11/16  1:02 PM  Result Value Ref Range   Activated Clotting Time 198 seconds  POCT Activated clotting time     Status: None   Collection Time: 04/11/16  2:07 PM  Result Value Ref Range   Activated Clotting Time 214 seconds  POCT Activated clotting time     Status: None   Collection Time: 04/11/16  3:12 PM  Result Value Ref Range   Activated Clotting Time 209 seconds  Glucose, capillary     Status: Abnormal   Collection Time: 04/11/16   3:56 PM  Result Value Ref Range   Glucose-Capillary 120 (H) 65 - 99 mg/dL   Comment 1 Arterial Specimen   Renal function panel (daily at 1600)     Status: Abnormal   Collection Time: 04/11/16  4:07 PM  Result Value Ref Range   Sodium 137 135 - 145 mmol/L   Potassium 3.8 3.5 - 5.1 mmol/L   Chloride 104 101 - 111 mmol/L   CO2 24 22 - 32 mmol/L   Glucose, Bld 109 (H) 65 - 99 mg/dL   BUN 24 (H) 6 - 20 mg/dL   Creatinine, Ser 2.21 (H) 0.61 - 1.24 mg/dL   Calcium 8.5 (L) 8.9 - 10.3 mg/dL   Phosphorus 3.2 2.5 - 4.6 mg/dL   Albumin 2.4 (L) 3.5 - 5.0 g/dL   GFR calc non Af Amer 29 (L) >60 mL/min   GFR calc Af Amer 34 (L) >60 mL/min   Anion gap 9 5 - 15  POCT Activated clotting time     Status: None   Collection Time: 04/11/16  7:01 PM  Result Value Ref Range   Activated Clotting Time 209 seconds  Glucose, capillary     Status: Abnormal   Collection Time: 04/11/16  7:48 PM  Result Value Ref Range   Glucose-Capillary  112 (H) 65 - 99 mg/dL  POCT Activated clotting time     Status: None   Collection Time: 04/11/16 11:16 PM  Result Value Ref Range   Activated Clotting Time 188 seconds  POCT Activated clotting time     Status: None   Collection Time: 04/12/16 12:23 AM  Result Value Ref Range   Activated Clotting Time 183 seconds  Glucose, capillary     Status: Abnormal   Collection Time: 04/12/16 12:23 AM  Result Value Ref Range   Glucose-Capillary 135 (H) 65 - 99 mg/dL  POCT Activated clotting time     Status: None   Collection Time: 04/12/16  1:33 AM  Result Value Ref Range   Activated Clotting Time 188 seconds  POCT Activated clotting time     Status: None   Collection Time: 04/12/16  2:10 AM  Result Value Ref Range   Activated Clotting Time 209 seconds  POCT Activated clotting time     Status: None   Collection Time: 04/12/16  3:05 AM  Result Value Ref Range   Activated Clotting Time 204 seconds  POCT Activated clotting time     Status: None   Collection Time: 04/12/16  4:07  AM  Result Value Ref Range   Activated Clotting Time 219 seconds  Glucose, capillary     Status: Abnormal   Collection Time: 04/12/16  4:09 AM  Result Value Ref Range   Glucose-Capillary 122 (H) 65 - 99 mg/dL  Magnesium     Status: Abnormal   Collection Time: 04/12/16  4:35 AM  Result Value Ref Range   Magnesium 2.8 (H) 1.7 - 2.4 mg/dL  APTT     Status: Abnormal   Collection Time: 04/12/16  4:35 AM  Result Value Ref Range   aPTT 136 (H) 24 - 37 seconds  CBC     Status: Abnormal   Collection Time: 04/12/16  4:35 AM  Result Value Ref Range   WBC 32.8 (H) 4.0 - 10.5 K/uL   RBC 2.86 (L) 4.22 - 5.81 MIL/uL   Hemoglobin 8.5 (L) 13.0 - 17.0 g/dL   HCT 28.6 (L) 39.0 - 52.0 %   MCV 100.0 78.0 - 100.0 fL   MCH 29.7 26.0 - 34.0 pg   MCHC 29.7 (L) 30.0 - 36.0 g/dL   RDW 15.4 11.5 - 15.5 %   Platelets 213 150 - 400 K/uL  Renal function panel     Status: Abnormal   Collection Time: 04/12/16  4:35 AM  Result Value Ref Range   Sodium 139 135 - 145 mmol/L   Potassium 4.1 3.5 - 5.1 mmol/L   Chloride 104 101 - 111 mmol/L   CO2 22 22 - 32 mmol/L   Glucose, Bld 115 (H) 65 - 99 mg/dL   BUN 25 (H) 6 - 20 mg/dL   Creatinine, Ser 2.34 (H) 0.61 - 1.24 mg/dL   Calcium 8.6 (L) 8.9 - 10.3 mg/dL   Phosphorus 2.9 2.5 - 4.6 mg/dL   Albumin 2.5 (L) 3.5 - 5.0 g/dL   GFR calc non Af Amer 27 (L) >60 mL/min   GFR calc Af Amer 32 (L) >60 mL/min   Anion gap 13 5 - 15  Glucose, capillary     Status: None   Collection Time: 04/12/16  7:49 AM  Result Value Ref Range   Glucose-Capillary 96 65 - 99 mg/dL   Comment 1 Capillary Specimen   POCT Activated clotting time     Status: None   Collection  Time: 04/12/16  8:05 AM  Result Value Ref Range   Activated Clotting Time 229 seconds    Intake/Output Summary (Last 24 hours) at 04/12/16 0955 Last data filed at 04/12/16 0900  Gross per 24 hour  Intake  243.5 ml  Output   2096 ml  Net -1852.5 ml    Telemetry:  Occasional PVCs  ASSESSMENT AND  PLAN:  Active Problems:   DM2 (diabetes mellitus, type 2) (HCC)   HTN (hypertension)   CKD stage 4 due to type 2 diabetes mellitus (HCC)   Elevated troponin   Dyspnea   HLD (hyperlipidemia)   BPH (benign prostatic hyperplasia)   Sepsis ([place])   Type 2 diabetes mellitus with diabetic nephropathy (HCC)   Blindness, legal   Acute on chronic systolic heart failure, NYHA class 4 (HCC)   Leukocytosis   Wheezing   Heart failure (HCC)   Acute respiratory failure with hypoxia (HCC)   AKI (acute kidney injury) ([place])   Aspiration pneumonia (HCC)   NSTEMI (non-ST elevated myocardial infarction) ([place])   # Acute on chronic systolic and diastolic heart failure: Mr. Sokol appears euvolemic.  LVEF 40-45%.  Will resume home losartan and metoprolol as blood pressure tolerates.    # NSTEMI: Troponin was elevated to at least 6.41 on 5/29.  It was still trending up at the time.  EKG showed minimal anterior ST elevation with minimal inferolateral depression.  No significant change from prior.  Stress test 07/17/15 showed mild ischemia and a prior anterior infarct.  This was managed medically, as he was asymptomatic at the time. He denies any chest pain or pressure. We will plan to assess for ischemia as a follow-up appointment and less she developed symptoms while in the hospital.  # Hypoxic respiratory failure: # Aspiration pneumonia: Resolved.  He is now on room air satting 100%.   # Acute on chronic renal failure: Currently on CVVHD.  Per nephrology, this will be stopped when the filter clots. He will transition to HD.   # Hypertension: He is now hemodynamically stable.  Will start back home antihypertensives as BP allows.   # hyperlipidemia: His home pravastatin was switched to atorvastatin 80 mg this admission. He will need lipids and LFTs checked in 6 weeks.  Time spent: 25 minutes-Greater than 50% of this time was spent in counseling, explanation of diagnosis, planning of further management, and  coordination of care.    Jaysion Ramseyer C. Oval Linsey, MD, [facility] 04/12/2016 9:55 AM

## 2016-04-12 NOTE — Progress Notes (Signed)
Mental status deteriorating, fever and WBC increasing, concern for c.diff.  Patient protecting airway for now but intubation is not far off now that mental status is worsening.  Will keep in the ICU, pan culture, start vanc/ceftaz/flagyl and check for C diff, maintain NPO, if deteriorates then will intubate.    The patient is critically ill with multiple organ systems failure and requires high complexity decision making for assessment and support, frequent evaluation and titration of therapies, application of advanced monitoring technologies and extensive interpretation of multiple databases.   Critical Care Time devoted to patient care services described in this note is  35  Minutes. This time reflects time of care of this signee Dr Jennet Maduro. This critical care time does not reflect procedure time, or teaching time or supervisory time of PA/NP/Med student/Med Resident etc but could involve care discussion time.  Rush Farmer, M.D. [facility] Pulmonary/Critical Care Medicine. Pager: [phone]. After hours pager: [phone].

## 2016-04-12 NOTE — Progress Notes (Signed)
CVVHD management.  Hemodynamically stable.  Metabolically stable.  Has become less responsive in last few minutes, will check BS.  Still no urine output.  Will plan to DC CVVHD when current filter clots.

## 2016-04-12 NOTE — Progress Notes (Signed)
Nutrition Follow-up  INTERVENTION:   Nepro Shake po BID, each supplement provides 425 kcal and 19 grams protein  NUTRITION DIAGNOSIS:   Increased nutrient needs related to catabolic illness as evidenced by estimated needs. Ongoing.   GOAL:   Patient will meet greater than or equal to 90% of their needs Progressing.   MONITOR:   PO intake, Supplement acceptance, I & O's, Labs  ASSESSMENT:   PMHx of ICM (EF 45% on stress test), HTN, IDDM with CKD Stage IV who presented to the ED on 5/29 with acute hypoxemic respiratory failure requiring intubation, NSTEMI, and acute on chronic renal failure now on CRRT.  6/4 extubated 6/5 diet advanced, plan to d/c CRRT when filter clots and start IHD  Labs reviewed: magnesium elevated 2.8, K+ and PO4 are WNL CBG's: 112-135 Per RN pt vomited and a little less responsive. Pt was able to respond that he was hungry and liked ensure.   Diet Order:  DIET - DYS 1 Room service appropriate?: Yes; Fluid consistency:: Thin  Skin:  Reviewed, no issues  Last BM:  6/4 200 ml via rectal pouch  Height:   Ht Readings from Last 1 Encounters:  11/10/15 5' 7.5" (1.715 m)    Weight:   Wt Readings from Last 1 Encounters:  04/12/16 170 lb 10.2 oz (77.4 kg)    Ideal Body Weight:  70 kg  BMI:  Body mass index is 25.95 kg/(m^2).  Estimated Nutritional Needs:   Kcal:  2000-2200  Protein:  95-110 grams  Fluid:  > 2 L/day  EDUCATION NEEDS:   No education needs identified at this time  [place], [place], [place] Pager [phone] After Hours Pager

## 2016-04-13 ENCOUNTER — Inpatient Hospital Stay (HOSPITAL_COMMUNITY): Payer: Medicare HMO

## 2016-04-13 DIAGNOSIS — N184 Chronic kidney disease, stage 4 (severe): Secondary | ICD-10-CM

## 2016-04-13 DIAGNOSIS — J690 Pneumonitis due to inhalation of food and vomit: Secondary | ICD-10-CM | POA: Insufficient documentation

## 2016-04-13 DIAGNOSIS — G9340 Encephalopathy, unspecified: Secondary | ICD-10-CM

## 2016-04-13 DIAGNOSIS — D72829 Elevated white blood cell count, unspecified: Secondary | ICD-10-CM

## 2016-04-13 LAB — BLOOD CULTURE ID PANEL (REFLEXED)
Acinetobacter baumannii: NOT DETECTED
CANDIDA ALBICANS: NOT DETECTED
CANDIDA GLABRATA: NOT DETECTED
CANDIDA PARAPSILOSIS: NOT DETECTED
CANDIDA TROPICALIS: NOT DETECTED
Candida krusei: NOT DETECTED
Carbapenem resistance: NOT DETECTED
ENTEROBACTER CLOACAE COMPLEX: NOT DETECTED
ENTEROCOCCUS SPECIES: NOT DETECTED
Enterobacteriaceae species: NOT DETECTED
Escherichia coli: NOT DETECTED
HAEMOPHILUS INFLUENZAE: NOT DETECTED
KLEBSIELLA PNEUMONIAE: NOT DETECTED
Klebsiella oxytoca: NOT DETECTED
LISTERIA MONOCYTOGENES: NOT DETECTED
Methicillin resistance: DETECTED — AB
Neisseria meningitidis: NOT DETECTED
PROTEUS SPECIES: NOT DETECTED
Pseudomonas aeruginosa: NOT DETECTED
SERRATIA MARCESCENS: NOT DETECTED
STAPHYLOCOCCUS AUREUS BCID: NOT DETECTED
STREPTOCOCCUS PNEUMONIAE: NOT DETECTED
STREPTOCOCCUS PYOGENES: NOT DETECTED
Staphylococcus species: DETECTED — AB
Streptococcus agalactiae: NOT DETECTED
Streptococcus species: NOT DETECTED
VANCOMYCIN RESISTANCE: NOT DETECTED

## 2016-04-13 LAB — BASIC METABOLIC PANEL
ANION GAP: 15 (ref 5–15)
Anion gap: 25 — ABNORMAL HIGH (ref 5–15)
BUN: 73 mg/dL — ABNORMAL HIGH (ref 6–20)
BUN: 56 mg/dL — AB (ref 6–20)
CALCIUM: 9 mg/dL (ref 8.9–10.3)
CALCIUM: 8.5 mg/dL — AB (ref 8.9–10.3)
CHLORIDE: 103 mmol/L (ref 101–111)
CO2: 22 mmol/L (ref 22–32)
CO2: 15 mmol/L — ABNORMAL LOW (ref 22–32)
CREATININE: 5.48 mg/dL — AB (ref 0.61–1.24)
CREATININE: 4.88 mg/dL — AB (ref 0.61–1.24)
Chloride: 104 mmol/L (ref 101–111)
GFR, EST AFRICAN AMERICAN: 11 mL/min — AB (ref >60)
GFR, EST AFRICAN AMERICAN: 13 mL/min — AB (ref >60)
GFR, EST NON AFRICAN AMERICAN: 10 mL/min — AB (ref >60)
GFR, EST NON AFRICAN AMERICAN: 11 mL/min — AB (ref >60)
GLUCOSE: 156 mg/dL — AB (ref 65–99)
Glucose, Bld: 112 mg/dL — ABNORMAL HIGH (ref 65–99)
Potassium: 4 mmol/L (ref 3.5–5.1)
Potassium: 5 mmol/L (ref 3.5–5.1)
SODIUM: 143 mmol/L (ref 135–145)
Sodium: 141 mmol/L (ref 135–145)

## 2016-04-13 LAB — GLUCOSE, CAPILLARY
GLUCOSE-CAPILLARY: 105 mg/dL — AB (ref 65–99)
GLUCOSE-CAPILLARY: 159 mg/dL — AB (ref 65–99)
GLUCOSE-CAPILLARY: 59 mg/dL — AB (ref 65–99)
GLUCOSE-CAPILLARY: 74 mg/dL (ref 65–99)
GLUCOSE-CAPILLARY: 99 mg/dL (ref 65–99)
Glucose-Capillary: 153 mg/dL — ABNORMAL HIGH (ref 65–99)
Glucose-Capillary: 162 mg/dL — ABNORMAL HIGH (ref 65–99)
Glucose-Capillary: 163 mg/dL — ABNORMAL HIGH (ref 65–99)
Glucose-Capillary: 168 mg/dL — ABNORMAL HIGH (ref 65–99)
Glucose-Capillary: 216 mg/dL — ABNORMAL HIGH (ref 65–99)
Glucose-Capillary: 62 mg/dL — ABNORMAL LOW (ref 65–99)

## 2016-04-13 LAB — CBC
HCT: 24.4 % — ABNORMAL LOW (ref 39.0–52.0)
Hemoglobin: 7.6 g/dL — ABNORMAL LOW (ref 13.0–17.0)
MCH: 32.5 pg (ref 26.0–34.0)
MCHC: 31.1 g/dL (ref 30.0–36.0)
MCV: 104.3 fL — ABNORMAL HIGH (ref 78.0–100.0)
PLATELETS: 212 10*3/uL (ref 150–400)
RBC: 2.34 MIL/uL — ABNORMAL LOW (ref 4.22–5.81)
RDW: 16.9 % — AB (ref 11.5–15.5)
WBC: 40.1 10*3/uL — AB (ref 4.0–10.5)

## 2016-04-13 LAB — POCT I-STAT 3, ART BLOOD GAS (G3+)
ACID-BASE DEFICIT: 5 mmol/L — AB (ref 0.0–2.0)
BICARBONATE: 20.5 meq/L (ref 20.0–24.0)
O2 SAT: 100 %
PO2 ART: 394 mmHg — AB (ref 80.0–100.0)
TCO2: 22 mmol/L (ref 0–100)
pCO2 arterial: 41.7 mmHg (ref 35.0–45.0)
pH, Arterial: 7.302 — ABNORMAL LOW (ref 7.350–7.450)

## 2016-04-13 LAB — MAGNESIUM
MAGNESIUM: 3.1 mg/dL — AB (ref 1.7–2.4)
Magnesium: 3.2 mg/dL — ABNORMAL HIGH (ref 1.7–2.4)

## 2016-04-13 LAB — PHOSPHORUS
PHOSPHORUS: 5.7 mg/dL — AB (ref 2.5–4.6)
Phosphorus: 3.9 mg/dL (ref 2.5–4.6)

## 2016-04-13 LAB — APTT: APTT: 45 s — AB (ref 24–37)

## 2016-04-13 LAB — ALBUMIN: ALBUMIN: 2.4 g/dL — AB (ref 3.5–5.0)

## 2016-04-13 MED ORDER — FAMOTIDINE IN NACL 20-0.9 MG/50ML-% IV SOLN
20.0000 mg | INTRAVENOUS | Status: DC
Start: 2016-04-13 — End: 2016-04-14
  Administered 2016-04-14: 20 mg via INTRAVENOUS
  Filled 2016-04-13: qty 50

## 2016-04-13 MED ORDER — FENTANYL CITRATE (PF) 100 MCG/2ML IJ SOLN
50.0000 ug | INTRAMUSCULAR | Status: DC | PRN
  Administered 2016-04-13: 50 ug via INTRAVENOUS

## 2016-04-13 MED ORDER — SODIUM CHLORIDE 0.9 % IV SOLN
500.0000 mg | INTRAVENOUS | Status: DC
  Administered 2016-04-14: 500 mg via INTRAVENOUS
  Filled 2016-04-13: qty 0.5

## 2016-04-13 MED ORDER — DEXTROSE 10 % IV SOLN
INTRAVENOUS | Status: DC
  Administered 2016-04-13: 22:00:00 via INTRAVENOUS

## 2016-04-13 MED ORDER — DOCUSATE SODIUM 50 MG/5ML PO LIQD
100.0000 mg | Freq: Two times a day (BID) | ORAL | Status: DC | PRN
  Filled 2016-04-13: qty 10

## 2016-04-13 MED ORDER — SODIUM CHLORIDE 0.9 % IV BOLUS (SEPSIS)
500.0000 mL | Freq: Once | INTRAVENOUS | Status: AC
  Administered 2016-04-13: 500 mL via INTRAVENOUS

## 2016-04-13 MED ORDER — NOREPINEPHRINE BITARTRATE 1 MG/ML IV SOLN
2.0000 ug/min | INTRAVENOUS | Status: DC
  Administered 2016-04-13: 10 ug/min via INTRAVENOUS
  Filled 2016-04-13: qty 4

## 2016-04-13 MED ORDER — ANTISEPTIC ORAL RINSE SOLUTION (CORINZ): 7.0000 mL | Freq: Four times a day (QID) | OROMUCOSAL | Status: DC

## 2016-04-13 MED ORDER — PANTOPRAZOLE SODIUM 40 MG PO PACK: 40.0000 mg | PACK | Freq: Every day | ORAL | Status: DC

## 2016-04-13 MED ORDER — DEXTROSE 5 % IV SOLN: 1.5000 g | INTRAVENOUS | Status: DC

## 2016-04-13 MED ORDER — ROCURONIUM BROMIDE 50 MG/5ML IV SOLN
1.0000 mg/kg | Freq: Once | INTRAVENOUS | Status: AC
  Administered 2016-04-13: 80 mg via INTRAVENOUS

## 2016-04-13 MED ORDER — FENTANYL CITRATE (PF) 100 MCG/2ML IJ SOLN
100.0000 ug | INTRAMUSCULAR | Status: DC | PRN
  Filled 2016-04-13: qty 2

## 2016-04-13 MED ORDER — VANCOMYCIN HCL IN DEXTROSE 500-5 MG/100ML-% IV SOLN
INTRAVENOUS | Status: AC
  Filled 2016-04-13: qty 100

## 2016-04-13 MED ORDER — SODIUM BICARBONATE 8.4 % IV SOLN
INTRAVENOUS | Status: AC
  Administered 2016-04-13: 50 meq
  Filled 2016-04-13: qty 50

## 2016-04-13 MED ORDER — ETOMIDATE 2 MG/ML IV SOLN
0.3000 mg/kg | Freq: Once | INTRAVENOUS | Status: AC
  Administered 2016-04-13: 20 mg via INTRAVENOUS

## 2016-04-13 MED ORDER — PHENYLEPHRINE HCL 10 MG/ML IJ SOLN
30.0000 ug/min | INTRAVENOUS | Status: DC
  Filled 2016-04-13: qty 1

## 2016-04-13 MED ORDER — MIDAZOLAM HCL 2 MG/2ML IJ SOLN
INTRAMUSCULAR | Status: AC
  Administered 2016-04-13: 2 mg
  Filled 2016-04-13: qty 2

## 2016-04-13 MED ORDER — VITAL HIGH PROTEIN PO LIQD: 1000.0000 mL | ORAL | Status: DC

## 2016-04-13 MED ORDER — ASPIRIN 81 MG PO CHEW
81.0000 mg | CHEWABLE_TABLET | Freq: Every day | ORAL | Status: DC
  Administered 2016-04-13: 81 mg via ORAL
  Filled 2016-04-13: qty 1

## 2016-04-13 MED ORDER — ALBUTEROL SULFATE (2.5 MG/3ML) 0.083% IN NEBU: 2.5000 mg | INHALATION_SOLUTION | RESPIRATORY_TRACT | Status: DC | PRN

## 2016-04-13 MED ORDER — FENTANYL CITRATE (PF) 100 MCG/2ML IJ SOLN
INTRAMUSCULAR | Status: AC
  Administered 2016-04-13: 50 ug via INTRAVENOUS
  Filled 2016-04-13: qty 2

## 2016-04-13 MED ORDER — FENTANYL CITRATE (PF) 100 MCG/2ML IJ SOLN
50.0000 ug | INTRAMUSCULAR | Status: DC | PRN
  Administered 2016-04-13: 50 ug via INTRAVENOUS
  Filled 2016-04-13: qty 2

## 2016-04-13 MED ORDER — PRO-STAT SUGAR FREE PO LIQD
30.0000 mL | Freq: Two times a day (BID) | ORAL | Status: DC
  Filled 2016-04-13: qty 30

## 2016-04-13 MED ORDER — CHLORHEXIDINE GLUCONATE 0.12% ORAL RINSE (MEDLINE KIT): 15.0000 mL | Freq: Two times a day (BID) | OROMUCOSAL | Status: DC

## 2016-04-13 MED ORDER — MIDAZOLAM HCL 2 MG/2ML IJ SOLN
2.0000 mg | INTRAMUSCULAR | Status: DC | PRN
  Filled 2016-04-13: qty 2

## 2016-04-13 MED ORDER — VANCOMYCIN HCL 500 MG IV SOLR
500.0000 mg | INTRAVENOUS | Status: AC
  Administered 2016-04-13: 500 mg via INTRAVENOUS
  Filled 2016-04-13: qty 500

## 2016-04-13 MED ORDER — DEXTROSE 50 % IV SOLN
INTRAVENOUS | Status: AC
  Filled 2016-04-13: qty 50

## 2016-04-13 NOTE — Progress Notes (Signed)
[facility] PCCM  Called to the bedside emergently because of confusion and shortness of breath.  Chart reviewed, he has been treated here for aspiration pneumonia and acute on chronic renal failure. He was treated with CVVHD while in the intensive care unit. He started on intermittent hemodialysis today.  He had his first session of intermittent hemodialysis this evening, towards the end of the session he became more confused and hypoglycemic. Upon arrival to the stepdown unit he was noted to be encephalopathic and breathing 50 times per minute and hypotensive.  He was treated with 2 doses of D50 and his blood sugar improved but his respiratory status and mental status did not change.  On exam Tachypneic, rhonchi bilaterally, breathing 50 times per minute Tachycardic, HR 110's, regular Obtunded, no response to sternal rub for me  BMET    Component Value Date/Time   NA 141 04/13/2016 0503   K 4.0 04/13/2016 0503   CL 104 04/13/2016 0503   CO2 22 04/13/2016 0503   GLUCOSE 156* 04/13/2016 0503   BUN 73* 04/13/2016 0503   CREATININE 5.48* 04/13/2016 0503   CREATININE 3.60* 11/10/2015 1449   CALCIUM 9.0 04/13/2016 0503   GFRNONAA 10* 04/13/2016 0503   GFRAA 11* 04/13/2016 0503    Impression/Plan: Acute encephalopathy, presumably due to hypoxemia/pneumonia > needs intubation to protect airway, follow neuro status afterwards, titrate intermittent fentanyl to RASS -1  Shock> due to sepsis? More likely due to hypovolemia: give volume now, start neosynephrine  Aspiration pneumonia vs worsening HCAP> check CXR, resp culture, stop cefepime start meropenem  Acute on chronic kidney failure> per renal, I let them know about this event, send BMET  My cc time 40 minutes outside procedures  Roselie Awkward, MD [place] PCCM Pager: [phone] Cell: [phone] After 3pm or if no response, call [phone]

## 2016-04-13 NOTE — Procedures (Signed)
Intubation Procedure Note Troy Young [id number] 05-26-1951  Procedure: Intubation Indications: Respiratory insufficiency  Procedure Details Consent: Unable to obtain consent because of emergent medical necessity. Time Out: Verified patient identification, verified procedure, site/side was marked, verified correct patient position, special equipment/implants available, medications/allergies/relevent history reviewed, required imaging and test results available.  Performed  Drugs Etomidate 20mg  Rocuronium 100mg , fentanyl 191mcg, versed 2mg  DL x 1 with GS 4 blade > somewhat difficult view with this blade mostly due to blade shape and not anatomy Grade 2 view 7.5 ET tube passed through cords under direct visualization Placement confirmed with bilateral breath sounds, positive EtCO2 change and smoke in tube  Note: Not difficult but would perform with GS 3 blade next time   Evaluation Hemodynamic Status: BP stable throughout; O2 sats: stable throughout Patient's Current Condition: stable Complications: No apparent complications Patient did tolerate procedure well. Chest X-ray ordered to verify placement.  CXR: pending.   Troy Young 04/13/2016

## 2016-04-13 NOTE — Progress Notes (Signed)
PATIENT ID:  [id number] with hypertension and diabetes here with acute on chronic kidney disease requiring CVVHD, hypoxic respiratory failure due to aspiration pneumonia and acute on chronic systolic (LVEF [id number]) and diastolic heart failure.   Interval History: Mr. Flora developed a fever and his mental status deteriorated.  He was cultured and started on broad antibiotic coverage.  SUBJECTIVE:  Feeling better today.  His only complaint is a sore throat.    PHYSICAL EXAM Filed Vitals:   04/13/16 0200 04/13/16 0300 04/13/16 0400 04/13/16 0500  BP:      Pulse: 97 97 96 95  Temp:   100.2 F (37.9 C)   TempSrc:   Oral   Resp: 25 33 30 27  Weight:    169 lb 5 oz (76.8 kg)  SpO2: 100% 98% 98% 99%   General:  Chronically ill-appearing. No acute distress HEENT: No oral thrush or erythema Neck:  Difficult to assess JVP.  He has lines in both IJs.  Lungs:  Good auscultation bilaterally. No crackles, rhonchi or wheezes. Heart:  Regular rate and rhythm. No murmurs, rubs, or gallops. Abdomen:  Soft. Nontender, nondistended. Active bowel sounds. Extremities:  Warm and well-perfused. No edema.  LABS: Lab Results  Component Value Date   TROPONINI 6.41* 04/05/2016   Results for orders placed or performed during the hospital encounter of 04/07/2016 (from the past 24 hour(s))  Glucose, capillary     Status: Abnormal   Collection Time: 04/12/16  8:41 AM  Result Value Ref Range   Glucose-Capillary 105 (H) 65 - 99 mg/dL  POCT Activated clotting time     Status: None   Collection Time: 04/12/16  9:14 AM  Result Value Ref Range   Activated Clotting Time 214 seconds  Glucose, capillary     Status: Abnormal   Collection Time: 04/12/16 11:43 AM  Result Value Ref Range   Glucose-Capillary 128 (H) 65 - 99 mg/dL   Comment 1 Capillary Specimen   C difficile quick scan w PCR reflex     Status: None   Collection Time: 04/12/16 12:29 PM  Result Value Ref Range   C Diff antigen NEGATIVE NEGATIVE   C Diff  toxin NEGATIVE NEGATIVE   C Diff interpretation Negative for toxigenic C. difficile   Glucose, capillary     Status: Abnormal   Collection Time: 04/12/16  4:04 PM  Result Value Ref Range   Glucose-Capillary 141 (H) 65 - 99 mg/dL   Comment 1 Capillary Specimen   Renal function panel (daily at 1600)     Status: Abnormal   Collection Time: 04/12/16  4:54 PM  Result Value Ref Range   Sodium 138 135 - 145 mmol/L   Potassium 4.0 3.5 - 5.1 mmol/L   Chloride 105 101 - 111 mmol/L   CO2 22 22 - 32 mmol/L   Glucose, Bld 141 (H) 65 - 99 mg/dL   BUN 45 (H) 6 - 20 mg/dL   Creatinine, Ser 3.75 (H) 0.61 - 1.24 mg/dL   Calcium 8.6 (L) 8.9 - 10.3 mg/dL   Phosphorus 3.2 2.5 - 4.6 mg/dL   Albumin 2.4 (L) 3.5 - 5.0 g/dL   GFR calc non Af Amer 15 (L) >60 mL/min   GFR calc Af Amer 18 (L) >60 mL/min   Anion gap 11 5 - 15  Glucose, capillary     Status: Abnormal   Collection Time: 04/12/16 10:31 PM  Result Value Ref Range   Glucose-Capillary 168 (H) 65 - 99 mg/dL  I-STAT 3, arterial blood gas (G3+)     Status: Abnormal   Collection Time: 04/12/16 10:57 PM  Result Value Ref Range   pH, Arterial 7.495 (H) 7.350 - 7.450   pCO2 arterial 32.4 (L) 35.0 - 45.0 mmHg   pO2, Arterial 100.0 80.0 - 100.0 mmHg   Bicarbonate 24.8 (H) 20.0 - 24.0 mEq/L   TCO2 26 0 - 100 mmol/L   O2 Saturation 98.0 %   Acid-Base Excess 2.0 0.0 - 2.0 mmol/L   Patient temperature 100.2 F    Sample type ARTERIAL   Glucose, capillary     Status: Abnormal   Collection Time: 04/13/16 12:41 AM  Result Value Ref Range   Glucose-Capillary 162 (H) 65 - 99 mg/dL  Glucose, capillary     Status: Abnormal   Collection Time: 04/13/16  4:36 AM  Result Value Ref Range   Glucose-Capillary 153 (H) 65 - 99 mg/dL  Magnesium     Status: Abnormal   Collection Time: 04/13/16  5:03 AM  Result Value Ref Range   Magnesium 3.2 (H) 1.7 - 2.4 mg/dL  APTT     Status: Abnormal   Collection Time: 04/13/16  5:03 AM  Result Value Ref Range   aPTT 45 (H)  24 - 37 seconds  CBC     Status: Abnormal   Collection Time: 04/13/16  5:03 AM  Result Value Ref Range   WBC 40.1 (H) 4.0 - 10.5 K/uL   RBC 2.34 (L) 4.22 - 5.81 MIL/uL   Hemoglobin 7.6 (L) 13.0 - 17.0 g/dL   HCT 24.4 (L) 39.0 - 52.0 %   MCV 104.3 (H) 78.0 - 100.0 fL   MCH 32.5 26.0 - 34.0 pg   MCHC 31.1 30.0 - 36.0 g/dL   RDW 16.9 (H) 11.5 - 15.5 %   Platelets 212 150 - 400 K/uL  Basic metabolic panel     Status: Abnormal   Collection Time: 04/13/16  5:03 AM  Result Value Ref Range   Sodium 141 135 - 145 mmol/L   Potassium 4.0 3.5 - 5.1 mmol/L   Chloride 104 101 - 111 mmol/L   CO2 22 22 - 32 mmol/L   Glucose, Bld 156 (H) 65 - 99 mg/dL   BUN 73 (H) 6 - 20 mg/dL   Creatinine, Ser 5.48 (H) 0.61 - 1.24 mg/dL   Calcium 9.0 8.9 - 10.3 mg/dL   GFR calc non Af Amer 10 (L) >60 mL/min   GFR calc Af Amer 11 (L) >60 mL/min   Anion gap 15 5 - 15  Phosphorus     Status: None   Collection Time: 04/13/16  5:03 AM  Result Value Ref Range   Phosphorus 3.9 2.5 - 4.6 mg/dL  Albumin     Status: Abnormal   Collection Time: 04/13/16  5:03 AM  Result Value Ref Range   Albumin 2.4 (L) 3.5 - 5.0 g/dL    Intake/Output Summary (Last 24 hours) at 04/13/16 0810 Last data filed at 04/13/16 0500  Gross per 24 hour  Intake    550 ml  Output    118 ml  Net    432 ml    Telemetry:  Occasional PVCs  ASSESSMENT AND PLAN:  Active Problems:   DM2 (diabetes mellitus, type 2) (HCC)   HTN (hypertension)   CKD stage 4 due to type 2 diabetes mellitus (HCC)   Elevated troponin   Dyspnea   HLD (hyperlipidemia)   BPH (benign prostatic hyperplasia)   Sepsis ([place])  Type 2 diabetes mellitus with diabetic nephropathy (HCC)   Blindness, legal   Acute on chronic systolic heart failure, NYHA class 4 (HCC)   Leukocytosis   Wheezing   Heart failure (HCC)   Acute respiratory failure with hypoxia (HCC)   AKI (acute kidney injury) ([place])   Aspiration pneumonia (HCC)   NSTEMI (non-ST elevated myocardial  infarction) ([place])   Severe sepsis ([place])   # Leukocytosis: No clear source.  He was cultured and started on broad antibiotic coverage.  WBC is higher today but he is feeling better.  Stool was negative for ischemia.  # Acute on chronic systolic and diastolic heart failure: Mr. Redburn appears euvolemic.  LVEF 40-45%.  Given his fevers and low blood pressures we will not resume his home losartan and metoprolol at this time.  # Acute on chronic renal failure: CVVHD stopped yesterday. He will transition to hemodialysis.  # NSTEMI: Troponin was elevated to at least 6.41 on 5/29.  It was still trending up at the time.  EKG showed minimal anterior ST elevation with minimal inferolateral depression.  No significant change from prior.  Stress test 07/17/15 showed mild ischemia and a prior anterior infarct.  This was managed medically, as he was asymptomatic at the time. He denies any chest pain or pressure. We will plan to assess for ischemia as a follow-up appointment and less she developed symptoms while in the hospital.  # Hypoxic respiratory failure: # Aspiration pneumonia: Resolved.  He is now on room air satting 100%.   # Hypertension: He is now hemodynamically stable.  Will start back home antihypertensives as BP allows.   # Hyperlipidemia: His home pravastatin was switched to atorvastatin 80 mg this admission. He will need lipids and LFTs checked in 6 weeks.   Tesla Bochicchio C. Oval Linsey, MD, [facility] 04/13/2016 8:10 AM

## 2016-04-13 NOTE — Significant Event (Signed)
Rapid Response Event Note  Overview: Time Called: 2050 Arrival Time: 2052 Event Type: Respiratory, Neurologic  Initial Focused Assessment: Patient has just finished HD treatment.  Per Rn he was more awake and responsive at the beginning of the treatment.  He is now lethargic,  CBG 62 received 1/2 amp D50 Upon my arrival he is tachypnic and has labored breathing  RR 40s unable to obtain O2 sats Lung sounds with rhonchi through out. Patient arousable to touch. Diaphoretic.  weak cough BP 86/56  ST 115  RR 35-50    Interventions: Transported patient to 2c15.  Sister at bedside Repeat CBG 59, addition 1/2 amp D50 given  CBG 99 D10 started at 40cc/hr 500cc NS bolus given Dr Lake Bells at bedside Transported patient to [place] intubated Levophed started    Event Summary: Name of Physician Notified: Dr Sommer/ Dr Lake Bells at 2050    at    Outcome: Transferred (Comment)  Event End Time: 2246  Raliegh Ip

## 2016-04-13 NOTE — Progress Notes (Signed)
PHARMACY - PHYSICIAN COMMUNICATION CRITICAL VALUE ALERT - BLOOD CULTURE IDENTIFICATION (BCID)  Results for orders placed or performed during the hospital encounter of 03/16/2016  Blood Culture ID Panel (Reflexed) (Collected: 04/12/2016 12:37 PM)  Result Value Ref Range   Enterococcus species NOT DETECTED NOT DETECTED   Vancomycin resistance NOT DETECTED NOT DETECTED   Listeria monocytogenes NOT DETECTED NOT DETECTED   Staphylococcus species DETECTED (A) NOT DETECTED   Staphylococcus aureus NOT DETECTED NOT DETECTED   Methicillin resistance DETECTED (A) NOT DETECTED   Streptococcus species NOT DETECTED NOT DETECTED   Streptococcus agalactiae NOT DETECTED NOT DETECTED   Streptococcus pneumoniae NOT DETECTED NOT DETECTED   Streptococcus pyogenes NOT DETECTED NOT DETECTED   Acinetobacter baumannii NOT DETECTED NOT DETECTED   Enterobacteriaceae species NOT DETECTED NOT DETECTED   Enterobacter cloacae complex NOT DETECTED NOT DETECTED   Escherichia coli NOT DETECTED NOT DETECTED   Klebsiella oxytoca NOT DETECTED NOT DETECTED   Klebsiella pneumoniae NOT DETECTED NOT DETECTED   Proteus species NOT DETECTED NOT DETECTED   Serratia marcescens NOT DETECTED NOT DETECTED   Carbapenem resistance NOT DETECTED NOT DETECTED   Haemophilus influenzae NOT DETECTED NOT DETECTED   Neisseria meningitidis NOT DETECTED NOT DETECTED   Pseudomonas aeruginosa NOT DETECTED NOT DETECTED   Candida albicans NOT DETECTED NOT DETECTED   Candida glabrata NOT DETECTED NOT DETECTED   Candida krusei NOT DETECTED NOT DETECTED   Candida parapsilosis NOT DETECTED NOT DETECTED   Candida tropicalis NOT DETECTED NOT DETECTED    Name of physician (or Provider) Contacted: Dr Titus Mould  Changes to prescribed antibiotics required: Already on vancomycin, no change necessary.  Wynona Neat, PharmD, BCPS  04/13/2016  11:27 PM

## 2016-04-13 NOTE — Progress Notes (Addendum)
Pharmacy Antibiotic Note  Troy Young is a 65 y.o. male admitted on 03/30/2016 with pneumonia, now with worsening respiratory status and new fever along with increased WBC and changes in mental status.  Pharmacy originally consulted for vancomycin and cefepime dosing >> now requested to transition Cefepime to Meropenem this evening  The patient was loaded with Vancomycin 1500 mg IV x 1 this afternoon (estimated Vanc level post load ~20.8 mcg/ml). The patient is noted to be receiving HD this evening at a BFR of 250 ml/min with an intended LOT of 3.5 hours. This can be roughly estimated to bring the patient's level down to ~15 mcg/ml. Will give a small maintenance dose with HD this evening to keep within range.   Plans are now to transition Cefepime to Meropenem to help cover for HCAP and aspiration PNA.  Plan: 1. Vancomycin 500 mg IV x 1 dose post HD today 2. Follow-up additional plans for HD and subsequent Vancomycin doses 3. D/c Cefepime 4. Start Meropenem 500 mg IV every 24 hours 5. Will continue to follow HD schedule/duration, culture results, LOT, and antibiotic de-escalation plans   Height: 5\' 8"  (172.7 cm) Weight: 175 lb 7.8 oz (79.6 kg) IBW/kg (Calculated) : 68.4  Temp (24hrs), Avg:100.2 F (37.9 C), Min:99.6 F (37.6 C), Max:100.8 F (38.2 C)   Recent Labs Lab 04/09/16 0431  04/10/16 0420  04/11/16 0530 04/11/16 1607 04/12/16 0435 04/12/16 1654 04/13/16 0503  WBC 22.2*  --  25.8*  --  32.0*  --  32.8*  --  40.1*  CREATININE  --   < > 1.82*  < > 2.05* 2.21* 2.34* 3.75* 5.48*  < > = values in this interval not displayed.  Estimated Creatinine Clearance: 12.8 mL/min (by C-G formula based on Cr of 5.48).    Allergies  Allergen Reactions  . Pork-Derived Products Other (See Comments)    Pt does not eat pork or pork products    Antimicrobials this admission: Vancomycin 5/29>>6/1; 6/5>> Zosyn 5/29>>6/1 Unasyn 6/1>6/3 Doxycycline 5/30>6/1 Cefepime 6/5>> 6/6 Flagyl  6/5>> 6/6 Meropenem 6/6 >>  Microbiology results: 5/28 BCx - ngtd 5/29 UCx - multiple species 5/29 sputum cx - no organisms seen 5/29 MRSA PCR - positive RMSF negative 6/5 CDiff: ordered 6/5 BCx: ordered  Thank you for allowing pharmacy to be a part of this patient's care.  Alycia Rossetti, PharmD, BCPS Clinical Pharmacist Pager: [phone] 04/13/2016 7:55 PM

## 2016-04-13 NOTE — Evaluation (Signed)
Physical Therapy Evaluation Patient Details Name: Troy Young MRN: [id number] DOB: February 11, 1951 Today's Date: 04/13/2016   History of Present Illness  Pt is a 38 here with acute on chronic kidney disease requiring CVVHD, hypoxic respiratory failure due to aspiration pneumonia and acute on chronic systolic (LVEF A999333) and diastolic heart failure. CVVHD was d/c yesterday.  On 5/29 troponin was elevated and EKG showed minimal anterior ST elevation w/ minimal inferolateral depression.  Stress test on 9/8 showed mild ischemia and prior anterior infarct.  Pt intubated and extubated on 6/4.  Pt's PMH includes legal blindness, spontaneous pneumothorax, anemia, cancer, diabetic retinopathy.    Clinical Impression  Pt admitted with above diagnosis. Pt currently with functional limitations due to the deficits listed below (see PT Problem List). Session limited due to Rt femoral a-line.  Additionally, pt became nauseous after performing Lt LE exercises supine in bed.  RN made aware.  Recommending SNF but will continue to assess and update follow up recommendations as appropriate as pt progresses. Pt will benefit from skilled PT to increase their independence and safety with mobility to allow discharge to the venue listed below.      Follow Up Recommendations SNF    Equipment Recommendations  Other (comment) (TBD as pt progresses)    Recommendations for Other Services       Precautions / Restrictions Precautions Precautions: Fall Precaution Comments: Rt femoral arterial line Restrictions Weight Bearing Restrictions: No      Mobility  Bed Mobility               General bed mobility comments: deferred due to Rt femoral a-line  Transfers                    Ambulation/Gait                Stairs            Wheelchair Mobility    Modified Rankin (Stroke Patients Only)       Balance                                             Pertinent  Vitals/Pain Pain Assessment: No/denies pain    Home Living Family/patient expects to be discharged to:: Private residence Living Arrangements: Other relatives (sister, niece, nephew) Available Help at Discharge: Family;Available 24 hours/day Type of Home: Apartment Home Access: Stairs to enter   Entrance Stairs-Number of Steps: 4 Home Layout: One level Home Equipment: Other (comment);Cane - single point (walking) Additional Comments: Family has moved into a new apartment since pt has been in hospital so he is not sure the details of the layout yet.    Prior Function Level of Independence: Independent with assistive device(s)         Comments: Pt uses walking stick at all times.  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     Hand Dominance   Dominant Hand: Right    Extremity/Trunk Assessment               Lower Extremity Assessment: LLE deficits/detail   LLE Deficits / Details: strength grossly 4/5     Communication   Communication: No difficulties  Cognition Arousal/Alertness: Lethargic Behavior During Therapy: WFL for tasks assessed/performed Overall Cognitive Status: Within Functional Limits for tasks assessed  General Comments      Exercises General Exercises - Lower Extremity Ankle Circles/Pumps: AROM;AAROM;10 reps;Supine;Left Heel Slides: Strengthening;AROM;10 reps;Supine;Other (comment);Left (w/ manual resistance w/ knee extension) Hip ABduction/ADduction: AROM;Left;10 reps;Supine Straight Leg Raises: AAROM;Left;10 reps;Supine      Assessment/Plan    PT Assessment Patient needs continued PT services  PT Diagnosis Difficulty walking;Generalized weakness   PT Problem List Decreased strength;Decreased activity tolerance;Decreased balance;Decreased mobility;Decreased knowledge of use of DME;Decreased safety awareness;Cardiopulmonary status limiting activity  PT Treatment Interventions DME instruction;Gait  training;Functional mobility training;Stair training;Therapeutic activities;Therapeutic exercise;Balance training;Patient/family education   PT Goals (Current goals can be found in the Care Plan section) Acute Rehab PT Goals Patient Stated Goal: to feel better PT Goal Formulation: With patient Time For Goal Achievement: 04/27/16 Potential to Achieve Goals: Good    Frequency Min 3X/week   Barriers to discharge        Co-evaluation               End of Session Equipment Utilized During Treatment: Oxygen Activity Tolerance: Other (comment) (pt became nauseous and was dry heaving after Lt LE exercises) Patient left: in bed;with call bell/phone within reach;with bed alarm set;with SCD's reapplied Nurse Communication: Mobility status;Other (comment) (pt nauseous and dry heaving)         Time: [id number] PT Time Calculation (min) (ACUTE ONLY): 18 min   Charges:   PT Evaluation $PT Eval Moderate Complexity: 1 Procedure     PT G Codes:       Collie Siad PT, DPT  Pager: [phone] Phone: [phone] 04/13/2016, 11:59 AM

## 2016-04-13 NOTE — Progress Notes (Signed)
eLink Physician-Brief Progress Note Patient Name: Duain Pathak DOB: 01/29/1951 MRN: [id number]   Date of Service  04/13/2016  HPI/Events of Note  abg reviewed  eICU Interventions  fio2 to 70, peep will need 8 to recruit further to get o2 lower Drop MV Repeat abg        Raylene Miyamoto. 04/13/2016, 11:30 PM

## 2016-04-13 NOTE — Progress Notes (Signed)
eLink Physician-Brief Progress Note Patient Name: Troy Young DOB: [birth year] MRN: [id number]   Date of Service  04/13/2016  HPI/Events of Note  Decreased LOC while on HD. BP post HD = 98/60 >> 85/57. Blood glucose = 62 given 1/2 amp D50 >> blood glucose = 59 given another 1/2 amp of D50.  eICU Interventions  Will order: 1. ABG now. 2. Portable CXR now.  3. D10W to run at 40 mL/hour.  4. Will ask bedside coverage to evaluate.  5. 0.9 NaCl 500 mL IV over 30 minutes now.      Intervention Category Major Interventions: Other: Intermediate Interventions: Respiratory distress - evaluation and management  Sommer,Steven Eugene 04/13/2016, 9:26 PM

## 2016-04-13 NOTE — Progress Notes (Signed)
Speech Language Pathology Treatment: Dysphagia  Patient Details Name: Troy Young MRN: [id number] DOB: 09/29/1951 Today's Date: 04/13/2016 Time: 8413-2440 SLP Time Calculation (min) (ACUTE ONLY): 11 min  Assessment / Plan / Recommendation Clinical Impression  Pt seen for followup dysphagia treatment. Pt alert and talkative. RN reports good intake without difficulty for breakfast. Pt denied any choking or coughing with PO intake. Attempted denture placement for upgraded PO trial, however no fixative available and dentures were ill-fitting non functional for PO intake. Pt declined to trial PO that requires mastication without dentures in place- he verbalized preference to remain on puree diet. Pt observed with thin via straw and puree consistency which was managed WNL. No s/s aspiration or penetration. Will sign off at this time. Suspect pt could manage an upgraded diet once his dentures are placed.    HPI HPI: Troy Young is an 65 y.o. 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 He states that his symptoms began on Friday after he had food from [employer]. He has also complained of generalized edema, and is up about 15 lbs. In the ED, he was found to have a low-grade fever, and was treated for presumed sepsis with multiple liters of IVF, and his lactic acid rose from mid-2s to mid-4s. He denies subjective fevers, chills, etc.      SLP Plan  All goals met     Recommendations  Diet recommendations: Dysphagia 1 (puree);Thin liquid Liquids provided via: Straw;Cup Medication Administration: Whole meds with puree Supervision: Full supervision/cueing for compensatory strategies;Trained caregiver to feed patient Compensations: Slow rate;Small sips/bites Postural Changes and/or Swallow Maneuvers: Seated upright 90 degrees             Oral Care  Recommendations: Oral care QID Follow up Recommendations: None Plan: All goals met     [place] MA, CCC-SLP Pager [phone] 04/13/2016, 9:45 AM

## 2016-04-13 NOTE — Consult Note (Signed)
[facility] NOTE   MRN : [id number]  Reason for Consult: acute kidney injury on CKD stage IV Referring Physician: Dr. Mercy Moore   History of Present Illness: 65 y/o male acute on CKD now in need of HD.  Has right IJ  That was placed by critical care 04/06/2016 and started on CRRT.  We have been consulted for placement of tunneled HD catheter.    Patient was admitted secondary to SOB.   Past medical history includes: Legally blind; DM2 (diabetes mellitus, type 2) ([place]); Hypertensionmanaged with Norvasc, Cozaar, and Metoprolol ; Hyperlipidemia managed with Pravastatin ; BPH (benign prostatic hyperplasia) ; Spontaneous pneumothorax; Renal insufficiency; Anemia; Cancer ([place]) (11/08/2013); CHF (congestive heart failure) ([place]); and Diabetic retinopathy ([place]).   Current Facility-Administered Medications  Medication Dose Route Frequency Provider Last Rate Last Dose  . 0.9 %  sodium chloride infusion  250 mL Intravenous PRN Luz Brazen, MD   Stopped at 04/05/16 [id number]  . acetaminophen (TYLENOL) suppository 650 mg  650 mg Rectal Q6H PRN Rush Farmer, MD      . antiseptic oral rinse (CPC / CETYLPYRIDINIUM CHLORIDE 0.05%) solution 7 mL  7 mL Mouth Rinse q12n4p Javier Glazier, MD   7 mL at 04/12/16 1600  . aspirin suppository 150 mg  150 mg Rectal Daily Javier Glazier, MD   150 mg at 04/12/16 1028  . atorvastatin (LIPITOR) tablet 80 mg  80 mg Oral q1800 Flossie Dibble, MD   80 mg at 04/10/16 1817  . ceFEPIme (MAXIPIME) 1 g in dextrose 5 % 50 mL IVPB  1 g Intravenous Q24H Lauren D Bajbus, RPH      . chlorhexidine (PERIDEX) 0.12 % solution 15 mL  15 mL Mouth Rinse BID Javier Glazier, MD   15 mL at 04/12/16 2230  . Chlorhexidine Gluconate Cloth 2 % PADS 6 each  6 each Topical Q0600 Chesley Mires, MD   6 each at 04/13/16 0538  . Darbepoetin Alfa (ARANESP) injection 200 mcg  200 mcg Intravenous Q Wed-HD Mauricia Area, MD   200 mcg at 04/07/16 1200  . famotidine  (PEPCID) IVPB 20 mg premix  20 mg Intravenous Q24H Chesley Mires, MD   20 mg at 04/12/16 1035  . feeding supplement (NEPRO CARB STEADY) liquid 237 mL  237 mL Oral BID BM Rush Farmer, MD   237 mL at 04/12/16 1400  . fentaNYL (SUBLIMAZE) injection 25-50 mcg  25-50 mcg Intravenous Q1H PRN Javier Glazier, MD   50 mcg at 04/10/16 0254  . ferumoxytol (FERAHEME) 510 mg in sodium chloride 0.9 % 100 mL IVPB  510 mg Intravenous Weekly Mauricia Area, MD   510 mg at 04/06/16 1656  . insulin aspart (novoLOG) injection 0-15 Units  0-15 Units Subcutaneous Q4H Marshell Garfinkel, MD   3 Units at 04/13/16 0909  . ipratropium-albuterol (DUONEB) 0.5-2.5 (3) MG/3ML nebulizer solution 3 mL  3 mL Nebulization Q6H PRN Flossie Dibble, MD   3 mL at 04/05/16 1707  . mupirocin ointment (BACTROBAN) 2 % 1 application  1 application Nasal BID Chesley Mires, MD   1 application at [id number]  . nitroGLYCERIN (NITROSTAT) SL tablet 0.4 mg  0.4 mg Sublingual Q5 min PRN Gareth Morgan, MD      . ondansetron (ZOFRAN) injection 4 mg  4 mg Intravenous Q6H PRN Rush Farmer, MD   4 mg at 04/12/16 1745  . sodium chloride flush (NS) 0.9 % injection 10-40 mL  10-40 mL Intracatheter Q12H William S Minor, NP   30 mL at 04/12/16 2245  . sodium chloride flush (NS) 0.9 % injection 10-40 mL  10-40 mL Intracatheter PRN Grace Bushy Minor, NP      . sodium chloride flush (NS) 0.9 % injection 3 mL  3 mL Intravenous Q12H Flossie Dibble, MD   3 mL at 04/12/16 2246    Pt meds include: Statin :Yes Betablocker: Yes ASA: Yes Other anticoagulants/antiplatelets: none  Past Medical History  Diagnosis Date  . 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  Marland Kitchen DM2 (diabetes mellitus, type 2) ([place])   . Hypertension   . Hyperlipidemia   . BPH (benign prostatic hyperplasia)   . Spontaneous pneumothorax     2  . Renal insufficiency   . Anemia   . Cancer ([place]) 11/08/2013    colon cancer (malignant polyp) removed  . CHF (congestive  heart failure) ([place])   . Diabetic retinopathy ([place])     legally blind since 1995.    Past Surgical History  Procedure Laterality Date  . Polyp removed    . Eye surgery    . Cataract extraction      Social History Social History  Substance Use Topics  . Smoking status: Former Smoker -- 0.00 packs/day for 0 years    Types: Cigarettes  . Smokeless tobacco: None     Comment: Quit early 90s  . Alcohol Use: No    Family History Family History  Problem Relation Age of Onset  . Cancer Father 46    prostate cancer  . Ulcers Mother   . Diabetes Sister   . Hypertension Sister   . Cancer Brother     lung cancer    Allergies  Allergen Reactions  . Pork-Derived Products Other (See Comments)    Pt does not eat pork or pork products     REVIEW OF SYSTEMS  General: [ ]  Weight loss, [ ]  Fever, [ ]  chills [x]  blind Neurologic: [ ]  Dizziness, [ ]  Blackouts, [ ]  Seizure [ ]  Stroke, [ ]  "Mini stroke", [ ]  Slurred speech, [ ]  Temporary blindness; [ ]  weakness in arms or legs, [ ]  Hoarseness [ ]  Dysphagia Cardiac: [ ]  Chest pain/pressure, [x ] Shortness of breath at rest [ ]  Shortness of breath with exertion, [ ]  Atrial fibrillation or irregular heartbeat  Vascular: [ ]  Pain in legs with walking, [ ]  Pain in legs at rest, [ ]  Pain in legs at night,  [ ]  Non-healing ulcer, [ ]  Blood clot in vein/DVT,   Pulmonary: [ ]  Home oxygen, [ ]  Productive cough, [ ]  Coughing up blood, [ ]  Asthma,  [ ]  Wheezing [ ]  COPD Musculoskeletal:  [ ]  Arthritis, [ ]  Low back pain, [ ]  Joint pain Hematologic: [ ]  Easy Bruising, [ ]  Anemia; [ ]  Hepatitis Gastrointestinal: [ ]  Blood in stool, [ ]  Gastroesophageal Reflux/heartburn, Urinary: [x ] chronic Kidney disease, [x ] on HD - [ ]  MWF or [ ]  TTHS, [ ]  Burning with urination, [ ]  Difficulty urinating Skin: [ ]  Rashes, [ ]  Wounds Psychological: [ ]  Anxiety, [ ]  Depression  Physical Examination Filed Vitals:   04/13/16 0500 04/13/16 0700 04/13/16 0800  04/13/16 0900  BP:      Pulse: 95 92 98 101  Temp:      TempSrc:      Resp: 27 27 31 23   Weight: 169 lb 5 oz (76.8  kg)     SpO2: 99% 93% 92% 96%   Body mass index is 25.75 kg/(m^2).  General:  WDWN in NAD HENT: WNL Eyes: blind Pulmonary: normal non-labored breathing , without wheezing Cardiac: RRR, without  Murmurs, rubs or gallops; No carotid bruits Abdomen: soft, NT, no masses Skin: no rashes, ulcers noted;  no Gangrene , no cellulitis; no open wounds;   Vascular Exam/Pulses:Palpable radial and DP pulses equal bilaterally   Musculoskeletal: no edema  Neurologic: A&O X 3; Appropriate Affect ;  SENSATION: normal; MOTOR FUNCTION: 5/5 Symmetric, active range of motion grossly intact bilateral UE/LE,  Speech is fluent/normal   Significant Diagnostic Studies: CBC Lab Results  Component Value Date   WBC 40.1* 04/13/2016   HGB 7.6* 04/13/2016   HCT 24.4* 04/13/2016   MCV 104.3* 04/13/2016   PLT 212 04/13/2016    BMET    Component Value Date/Time   NA 141 04/13/2016 0503   K 4.0 04/13/2016 0503   CL 104 04/13/2016 0503   CO2 22 04/13/2016 0503   GLUCOSE 156* 04/13/2016 0503   BUN 73* 04/13/2016 0503   CREATININE 5.48* 04/13/2016 0503   CREATININE 3.60* 11/10/2015 1449   CALCIUM 9.0 04/13/2016 0503   GFRNONAA 10* 04/13/2016 0503   GFRAA 11* 04/13/2016 0503   Estimated Creatinine Clearance: 12.8 mL/min (by C-G formula based on Cr of 5.48).  COAG Lab Results  Component Value Date   INR 2.19* 04/07/2016   INR 1.81* 04/05/2016   INR 1.43 09/29/2015     Non-Invasive Vascular Imaging:  None  ASSESSMENT/PLAN:  Acute on CKD now on HD Plan placement of tunneled HD catheter tomorrow. Npo past MN   Laurence Slate [facility] 04/13/2016 9:41 AM   Plan for catheter placement on Thursday  [facility]

## 2016-04-13 NOTE — Progress Notes (Signed)
S: Eating OK O:BP 117/51 mmHg  Pulse 95  Temp(Src) 100.2 F (37.9 C) (Oral)  Resp 27  Ht 5\' 8"  (1.727 m)  Wt 76.8 kg (169 lb 5 oz)  BMI 25.75 kg/m2  SpO2 99%  Intake/Output Summary (Last 24 hours) at 04/13/16 0859 Last data filed at 04/13/16 0500  Gross per 24 hour  Intake    550 ml  Output    118 ml  Net    432 ml   Weight change: -0.6 kg (-1 lb 5.2 oz) [id number] and alert CVS: RRR Resp: Few basilar crackles Abd: + BS NTND Ext: No edema NEURO: CNI Ox3 no asterixis   . antiseptic oral rinse  7 mL Mouth Rinse q12n4p  . aspirin  150 mg Rectal Daily  . atorvastatin  80 mg Oral q1800  . ceFEPime (MAXIPIME) IV  1 g Intravenous Q24H  . chlorhexidine  15 mL Mouth Rinse BID  . Chlorhexidine Gluconate Cloth  6 each Topical Q0600  . darbepoetin (ARANESP) injection - DIALYSIS  200 mcg Intravenous Q Wed-HD  . famotidine (PEPCID) IV  20 mg Intravenous Q24H  . feeding supplement (NEPRO CARB STEADY)  237 mL Oral BID BM  . ferumoxytol  510 mg Intravenous Weekly  . insulin aspart  0-15 Units Subcutaneous Q4H  . mupirocin ointment  1 application Nasal BID  . sodium chloride flush  10-40 mL Intracatheter Q12H  . sodium chloride flush  3 mL Intravenous Q12H   No results found. BMET    Component Value Date/Time   NA 141 04/13/2016 0503   K 4.0 04/13/2016 0503   CL 104 04/13/2016 0503   CO2 22 04/13/2016 0503   GLUCOSE 156* 04/13/2016 0503   BUN 73* 04/13/2016 0503   CREATININE 5.48* 04/13/2016 0503   CREATININE 3.60* 11/10/2015 1449   CALCIUM 9.0 04/13/2016 0503   GFRNONAA 10* 04/13/2016 0503   GFRAA 11* 04/13/2016 0503   CBC    Component Value Date/Time   WBC 40.1* 04/13/2016 0503   RBC 2.34* 04/13/2016 0503   HGB 7.6* 04/13/2016 0503   HCT 24.4* 04/13/2016 0503   HCT 30.5* 06/11/2015 1500   PLT 212 04/13/2016 0503   MCV 104.3* 04/13/2016 0503   MCH 32.5 04/13/2016 0503   MCHC 31.1 04/13/2016 0503   RDW 16.9* 04/13/2016 0503   LYMPHSABS 1.0 04/06/2016 0227   MONOABS 3.1* 04/06/2016 0227   EOSABS 0.0 04/06/2016 0227   BASOSABS 0.0 04/06/2016 0227     Assessment: 1. Acute on CKD 4, may not recover renal fx.  Will need to get PC placed and will notify [facility] 2. NSTEMI 3. PNA 4. DM  Plan: 1. Will plan HD today. Spoke with [facility] about getting PC placed this week 2. Recheck labs in AM   Troy Young

## 2016-04-13 NOTE — Progress Notes (Signed)
PULMONARY / CRITICAL CARE MEDICINE   Name: Troy Young MRN: [id number] DOB: 02-13-1951    ADMISSION DATE:  03/12/2016  CHIEF COMPLAINT:  Difficulty Breathing  SUBJECTIVE: No events overnight, tolerated extubation well.  VITAL SIGNS: BP 117/51 mmHg  Pulse 95  Temp(Src) 100.2 F (37.9 C) (Oral)  Resp 27  Ht 5\' 8"  (1.727 m)  Wt 76.8 kg (169 lb 5 oz)  BMI 25.75 kg/m2  SpO2 99%  HEMODYNAMICS: CVP:  [1 mmHg] 1 mmHg  VENTILATOR SETTINGS:    INTAKE / OUTPUT: I/O last 3 completed shifts: In: 550 [IV Piggyback:550] Out: [id number] [Urine:40; Other:1129]  PHYSICAL EXAMINATION: General: Alert and interactive, moving ext but weak. Neuro: follows commands, moves extremities HEENT: [place]/AT, PERRL, EOM-I and MMM. Cardiac: regular, no murmur Chest: no wheeze Abd: soft, non tender, mild distention Ext: no edema Skin: no rashes  LABS:  BMET  Recent Labs Lab 04/12/16 0435 04/12/16 1654 04/13/16 0503  NA 139 138 141  K 4.1 4.0 4.0  CL 104 105 104  CO2 22 22 22   BUN 25* 45* 73*  CREATININE 2.34* 3.75* 5.48*  GLUCOSE 115* 141* 156*   Electrolytes  Recent Labs Lab 04/11/16 0530  04/12/16 0435 04/12/16 1654 04/13/16 0503  CALCIUM 8.8*  < > 8.6* 8.6* 9.0  MG 2.7*  --  2.8*  --  3.2*  PHOS 2.0*  < > 2.9 3.2 3.9  < > = values in this interval not displayed.  CBC  Recent Labs Lab 04/11/16 0530 04/12/16 0435 04/13/16 0503  WBC 32.0* 32.8* 40.1*  HGB 8.8* 8.5* 7.6*  HCT 29.7* 28.6* 24.4*  PLT 209 213 212   Coag's  Recent Labs Lab 04/07/16 0355  04/11/16 0530 04/12/16 0435 04/13/16 0503  APTT >200*  < > 83* 136* 45*  INR 2.19*  --   --   --   --   < > = values in this interval not displayed.  Sepsis Markers  Recent Labs Lab 04/06/16 1305 04/07/16 0355  LATICACIDVEN 1.6  --   PROCALCITON  --  3.06   ABG  Recent Labs Lab 04/07/16 0902 04/08/16 1622 04/12/16 2257  PHART 7.432 7.314* 7.495*  PCO2ART 35.7 54.5* 32.4*  PO2ART 72.0* 60.0* 100.0    Liver Enzymes  Recent Labs Lab 04/12/16 0435 04/12/16 1654 04/13/16 0503  ALBUMIN 2.5* 2.4* 2.4*   Cardiac Enzymes No results for input(s): TROPONINI, PROBNP in the last 168 hours.  Glucose  Recent Labs Lab 04/12/16 0841 04/12/16 1143 04/12/16 1604 04/12/16 2231 04/13/16 0041 04/13/16 0436  GLUCAP 105* 128* 141* 168* 162* 153*   Imaging I reviewed CXR myself, mild pulmonary edema.  STUDIES:  5/29 Echo >> EF 40 to 45%, PAS 58 mmHg, mod MR, mod TR  CULTURES: Blood 5/29>> (-) Urine 5/29>> (-) Sputum 5/29>> (-) C diff 6/5>>>Negative Blood 6/5>>>NTD  ANTIBIOTICS: Ceftriaxone x1 dose 5/29 >> Vanc 5/29 >> 6/1>>>6/5>>> Pip-tazo 5/29 >> 6/1 Unasyn 6/1 >> 6/3 Cefepime 6/5>>>  SIGNIFICANT EVENTS: 5/28 Bolused 3L in ED 5/29 intubated 5/30 worsening renal failure, on pressors, hypekalemia  LINES/TUBES: 5/29 ETT>> 6/4 5/30 rt rad a line>>6/6 5/30 rt i j HD cath>> 5/30 L TLC >>6/6  DISCUSSION: 65 yo male with progressive dyspnea, edema from acute on chronic systolic CHF and aspiration pneumonia.  Extubated 6/4, spiked fevers on   ASSESSMENT / PLAN:  PULMONARY A: Acute Hypoxemic Resp Failure due to Pulmonary Edema, Demand Ischemia, Asp PNA P:   IS per RT protocol. Add flutter valve for  mucous clearance. Titrate O2 for sat of 88-92%. May need ambulatory desaturation prior to discharge for home O2. Ambulate. D/C central line.  CARDIOVASCULAR A:  Acute exacerbation of ischemic systolic cardiomyopathy NSTEMI  H/O HTN P:  Heparin infusion was dc'd 2/2 bleeding/anemia Cards consulted and following, appreciate input. D/C a-line.  RENAL A:   Acute on chronic kidney injury CKD 4 (baseline) Hyperkalemia P:   D/C CVVH and transition of standard HD. Correct electrolytes as indicated. BMET in AM.  GASTROINTESTINAL A:   Nutrition. P:   Diet dysphagia 3 per speech pathology. Protonix.  HEMATOLOGIC A:   Anemia, bleeding, hematuria P: Heparin  dc'd 2/2 anemia/bleeding SCDs  INFECTIOUS A:   Asp pna >> completed Abx 6/03. Fever and increased WBC 6/5. P:   Blood cultures on 6/5. Vanc/cefepime restarted 6/5. F/u on cultures.  ENDOCRINE A:   DM2. P:   Continue with SSI + basal (8 U nightly of lantus)  NEUROLOGIC A:   Acute metabolic encephalopathy >> Off fentanyl since 1741 on 04/09/16, versed since 0900 on 04/08/16 Blind. P:   Monitor mental status Minimize sedating medications  Discussed with bedside RN and TRH-MD.  Will transfer to SDU and to [facility] service with PCCM off 05-09-16.  Rush Farmer, M.D. [facility] Pulmonary/Critical Care Medicine. Pager: [phone]. After hours pager: [phone].

## 2016-04-13 NOTE — Progress Notes (Signed)
Pt has been on cvvh. phy there saw today and rec snf. sw ref placed.

## 2016-04-14 ENCOUNTER — Inpatient Hospital Stay (HOSPITAL_COMMUNITY): Payer: Medicare HMO

## 2016-04-14 LAB — BASIC METABOLIC PANEL
Anion gap: 32 — ABNORMAL HIGH (ref 5–15)
BUN: 54 mg/dL — AB (ref 6–20)
CALCIUM: 8.6 mg/dL — AB (ref 8.9–10.3)
CO2: 10 mmol/L — ABNORMAL LOW (ref 22–32)
CREATININE: 5.81 mg/dL — AB (ref 0.61–1.24)
Chloride: 99 mmol/L — ABNORMAL LOW (ref 101–111)
GFR calc Af Amer: 11 mL/min — ABNORMAL LOW (ref >60)
GFR, EST NON AFRICAN AMERICAN: 9 mL/min — AB (ref >60)
GLUCOSE: 151 mg/dL — AB (ref 65–99)
Potassium: 5.1 mmol/L (ref 3.5–5.1)
Sodium: 141 mmol/L (ref 135–145)

## 2016-04-14 LAB — POCT I-STAT 3, ART BLOOD GAS (G3+)
Acid-base deficit: 20 mmol/L — ABNORMAL HIGH (ref 0.0–2.0)
Bicarbonate: 7.5 mEq/L — ABNORMAL LOW (ref 20.0–24.0)
O2 SAT: 99 %
PCO2 ART: 24.9 mmHg — AB (ref 35.0–45.0)
PH ART: 7.093 — AB (ref 7.350–7.450)
PO2 ART: 165 mmHg — AB (ref 80.0–100.0)
Patient temperature: 99.7
TCO2: 8 mmol/L (ref 0–100)

## 2016-04-14 LAB — CBC
HCT: 24 % — ABNORMAL LOW (ref 39.0–52.0)
Hemoglobin: 7 g/dL — ABNORMAL LOW (ref 13.0–17.0)
MCH: 32.6 pg (ref 26.0–34.0)
MCHC: 29.2 g/dL — ABNORMAL LOW (ref 30.0–36.0)
MCV: 111.6 fL — AB (ref 78.0–100.0)
PLATELETS: 286 10*3/uL (ref 150–400)
RBC: 2.15 MIL/uL — ABNORMAL LOW (ref 4.22–5.81)
RDW: 19 % — AB (ref 11.5–15.5)
WBC: 76.2 10*3/uL (ref 4.0–10.5)

## 2016-04-14 LAB — BLOOD GAS, ARTERIAL
Acid-base deficit: 19 mmol/L — ABNORMAL HIGH (ref 0.0–2.0)
BICARBONATE: 8.4 meq/L — AB (ref 20.0–24.0)
Drawn by: 270271
FIO2: 0.4
O2 SAT: 95.9 %
PATIENT TEMPERATURE: 99.7
PCO2 ART: 27.3 mmHg — AB (ref 35.0–45.0)
PEEP: 5 cmH2O
PH ART: 7.122 — AB (ref 7.350–7.450)
PO2 ART: 128 mmHg — AB (ref 80.0–100.0)
RATE: 18 resp/min
TCO2: 9.2 mmol/L (ref 0–100)
VT: 550 mL

## 2016-04-14 LAB — RENAL FUNCTION PANEL
Albumin: 2.3 g/dL — ABNORMAL LOW (ref 3.5–5.0)
Anion gap: 33 — ABNORMAL HIGH (ref 5–15)
BUN: 54 mg/dL — ABNORMAL HIGH (ref 6–20)
CALCIUM: 8.6 mg/dL — AB (ref 8.9–10.3)
CO2: 11 mmol/L — AB (ref 22–32)
CREATININE: 5.8 mg/dL — AB (ref 0.61–1.24)
Chloride: 97 mmol/L — ABNORMAL LOW (ref 101–111)
GFR, EST AFRICAN AMERICAN: 11 mL/min — AB (ref >60)
GFR, EST NON AFRICAN AMERICAN: 9 mL/min — AB (ref >60)
GLUCOSE: 152 mg/dL — AB (ref 65–99)
PHOSPHORUS: 12.6 mg/dL — AB (ref 2.5–4.6)
Potassium: 5.1 mmol/L (ref 3.5–5.1)
Sodium: 141 mmol/L (ref 135–145)

## 2016-04-14 LAB — GLUCOSE, CAPILLARY
Glucose-Capillary: 196 mg/dL — ABNORMAL HIGH (ref 65–99)
Glucose-Capillary: 155 mg/dL — ABNORMAL HIGH (ref 65–99)
Glucose-Capillary: 108 mg/dL — ABNORMAL HIGH (ref 65–99)

## 2016-04-14 LAB — LACTIC ACID, PLASMA: LACTIC ACID, VENOUS: 16.5 mmol/L — AB (ref 0.5–2.0)

## 2016-04-14 LAB — PHOSPHORUS: Phosphorus: 12.6 mg/dL — ABNORMAL HIGH (ref 2.5–4.6)

## 2016-04-14 LAB — MAGNESIUM: Magnesium: 3.3 mg/dL — ABNORMAL HIGH (ref 1.7–2.4)

## 2016-04-14 LAB — PROCALCITONIN: Procalcitonin: 14.8 ng/mL

## 2016-04-14 LAB — CORTISOL: Cortisol, Plasma: 72.6 ug/dL

## 2016-04-14 MED ORDER — HEPARIN (PORCINE) IN NACL 100-0.45 UNIT/ML-% IJ SOLN: 1000.0000 [IU]/h | INTRAMUSCULAR | Status: DC

## 2016-04-14 MED ORDER — FENTANYL BOLUS VIA INFUSION
50.0000 ug | INTRAVENOUS | Status: DC | PRN
  Filled 2016-04-14: qty 50

## 2016-04-14 MED ORDER — PHENYLEPHRINE HCL 10 MG/ML IJ SOLN
0.0000 ug/min | INTRAVENOUS | Status: DC
  Administered 2016-04-14: 100 ug/min via INTRAVENOUS
  Filled 2016-04-14 (×4): qty 4

## 2016-04-14 MED ORDER — STERILE WATER FOR INJECTION IV SOLN
INTRAVENOUS | Status: DC
  Filled 2016-04-14 (×5): qty 150

## 2016-04-14 MED ORDER — HEPARIN SODIUM (PORCINE) 1000 UNIT/ML DIALYSIS
1000.0000 [IU] | INTRAMUSCULAR | Status: DC | PRN
  Administered 2016-04-14: 2400 [IU] via INTRAVENOUS_CENTRAL
  Filled 2016-04-14: qty 3
  Filled 2016-04-14 (×2): qty 6
  Filled 2016-04-14: qty 4

## 2016-04-14 MED ORDER — SODIUM BICARBONATE 8.4 % IV SOLN
100.0000 meq | Freq: Once | INTRAVENOUS | Status: AC
  Administered 2016-04-14: 100 meq via INTRAVENOUS

## 2016-04-14 MED ORDER — SODIUM CHLORIDE 0.9 % IV BOLUS (SEPSIS)
1000.0000 mL | Freq: Once | INTRAVENOUS | Status: AC
  Administered 2016-04-14: 1000 mL via INTRAVENOUS

## 2016-04-14 MED ORDER — VANCOMYCIN HCL IN DEXTROSE 750-5 MG/150ML-% IV SOLN
750.0000 mg | INTRAVENOUS | Status: DC
  Filled 2016-04-14: qty 150

## 2016-04-14 MED ORDER — EPINEPHRINE HCL 1 MG/ML IJ SOLN
0.5000 ug/min | INTRAMUSCULAR | Status: DC
  Administered 2016-04-14: 5 ug/min via INTRAVENOUS
  Filled 2016-04-14: qty 4

## 2016-04-14 MED ORDER — SODIUM CHLORIDE 0.9 % IV SOLN
200.0000 mg | Freq: Once | INTRAVENOUS | Status: DC
  Filled 2016-04-14: qty 200

## 2016-04-14 MED ORDER — HYDROCORTISONE NA SUCCINATE PF 100 MG IJ SOLR
50.0000 mg | Freq: Four times a day (QID) | INTRAMUSCULAR | Status: DC
  Administered 2016-04-14: 50 mg via INTRAVENOUS
  Filled 2016-04-14: qty 2

## 2016-04-14 MED ORDER — SODIUM CHLORIDE 0.9 % IV BOLUS (SEPSIS)
500.0000 mL | Freq: Once | INTRAVENOUS | Status: AC
  Administered 2016-04-14: 500 mL via INTRAVENOUS

## 2016-04-14 MED ORDER — SODIUM CHLORIDE 0.9 % FOR CRRT
INTRAVENOUS_CENTRAL | Status: DC | PRN
  Filled 2016-04-14: qty 1000

## 2016-04-14 MED ORDER — NOREPINEPHRINE BITARTRATE 1 MG/ML IV SOLN
2.0000 ug/min | INTRAVENOUS | Status: DC
  Administered 2016-04-14: 30 ug/min via INTRAVENOUS
  Filled 2016-04-14 (×3): qty 16

## 2016-04-14 MED ORDER — SODIUM BICARBONATE 8.4 % IV SOLN
INTRAVENOUS | Status: AC
  Filled 2016-04-14: qty 100

## 2016-04-14 MED ORDER — VASOPRESSIN 20 UNIT/ML IV SOLN
0.0300 [IU]/min | INTRAVENOUS | Status: DC
  Administered 2016-04-14: 0.03 [IU]/min via INTRAVENOUS
  Filled 2016-04-14: qty 2

## 2016-04-14 MED ORDER — SODIUM CHLORIDE 0.9 % IV BOLUS (SEPSIS)
750.0000 mL | Freq: Once | INTRAVENOUS | Status: AC
  Administered 2016-04-14: 750 mL via INTRAVENOUS

## 2016-04-14 MED ORDER — FENTANYL CITRATE (PF) 100 MCG/2ML IJ SOLN
50.0000 ug | Freq: Once | INTRAMUSCULAR | Status: AC
  Administered 2016-04-14: 50 ug via INTRAVENOUS

## 2016-04-14 MED ORDER — SODIUM CHLORIDE 0.9 % IV SOLN: INTRAVENOUS | Status: DC | PRN

## 2016-04-14 MED ORDER — SODIUM BICARBONATE 8.4 % IV SOLN
INTRAVENOUS | Status: AC
  Filled 2016-04-14: qty 50

## 2016-04-14 MED ORDER — DEXTROSE 5 % IV SOLN
0.5000 ug/min | INTRAVENOUS | Status: DC
  Filled 2016-04-14: qty 4

## 2016-04-14 MED ORDER — SODIUM CHLORIDE 0.9 % IV BOLUS (SEPSIS): 500.0000 mL | Freq: Once | INTRAVENOUS | Status: DC

## 2016-04-14 MED ORDER — PRISMASOL BGK 4/2.5 32-4-2.5 MEQ/L IV SOLN
INTRAVENOUS | Status: DC
  Filled 2016-04-14: qty 5000

## 2016-04-14 MED ORDER — SODIUM CHLORIDE 0.9 % IV SOLN: 100.0000 mg | INTRAVENOUS | Status: DC

## 2016-04-14 MED ORDER — SODIUM CHLORIDE 0.9 % IV SOLN
1.0000 g | Freq: Two times a day (BID) | INTRAVENOUS | Status: DC
  Filled 2016-04-14 (×2): qty 1

## 2016-04-14 MED ORDER — SODIUM BICARBONATE 8.4 % IV SOLN
INTRAVENOUS | Status: AC
  Filled 2016-04-14: qty 150

## 2016-04-14 MED ORDER — SODIUM CHLORIDE 0.9 % IV SOLN
25.0000 ug/h | INTRAVENOUS | Status: DC
  Filled 2016-04-14: qty 50

## 2016-04-14 MED ORDER — SODIUM BICARBONATE 8.4 % IV SOLN
INTRAVENOUS | Status: DC
  Administered 2016-04-14: 06:00:00 via INTRAVENOUS
  Filled 2016-04-14 (×2): qty 150

## 2016-04-14 MED ORDER — PRISMASOL BGK 4/2.5 32-4-2.5 MEQ/L IV SOLN
INTRAVENOUS | Status: DC
  Filled 2016-04-14 (×6): qty 5000

## 2016-04-14 MED FILL — Medication: Qty: 1 | Status: AC

## 2016-04-15 SURGERY — INSERTION OF DIALYSIS CATHETER
Anesthesia: Monitor Anesthesia Care | Site: Neck

## 2016-04-16 LAB — CULTURE, BLOOD (ROUTINE X 2)

## 2016-04-17 LAB — CULTURE, BLOOD (ROUTINE X 2): Culture: NO GROWTH

## 2016-04-19 ENCOUNTER — Telehealth: Payer: Self-pay

## 2016-04-19 NOTE — Telephone Encounter (Signed)
On 04/19/2016 I received a death certificate from [place] (original). The death certificate is for cremation. The patient is a patient of Doctor McQuaid. The death certificate will be taken to Pulmonary Unit for signature. On 04-27-2016 I received the death certificate back from Doctor McQuaid. I got the death certificate ready and called the funeral home to let them know the death certificate is ready for pickup. I also faxed a copy to the funeral home per their request.

## 2016-04-27 ENCOUNTER — Ambulatory Visit: Payer: Medicare HMO | Admitting: Sports Medicine

## 2016-05-08 NOTE — Progress Notes (Signed)
   04-15-16 0800  Clinical Encounter Type  Visited With Family  Visit Type Death  Referral From Nurse  Spiritual Encounters  Spiritual Needs Grief support;Emotional;Prayer  Theresa called to provide support for family of deceased pt; [place] offered spiritual, grief and emotional support and offered prayer for family. 8:32 AM Gwynn Burly

## 2016-05-08 NOTE — Procedures (Signed)
Hemodialysis Insertion Procedure Note Dustine Young [id number] 17-Mar-1951  Procedure: Insertion of Hemodialysis Catheter Type: 3 port  Indications: Hemodialysis   Procedure Details Consent: Risks of procedure as well as the alternatives and risks of each were explained to the (patient/caregiver).  Consent for procedure obtained. Time Out: Verified patient identification, verified procedure, site/side was marked, verified correct patient position, special equipment/implants available, medications/allergies/relevent history reviewed, required imaging and test results available.  Performed  Maximum sterile technique was used including antiseptics, cap, gloves, gown, hand hygiene, mask and sheet. Skin prep: Chlorhexidine; local anesthetic administered A antimicrobial bonded/coated triple lumen catheter was placed in the right femoral vein due to emergent situation using the Seldinger technique. Ultrasound guidance used.Yes.   Catheter placed to 20 cm. Blood aspirated via all 3 ports and then flushed x 3. Line sutured x 2 and dressing applied.  Evaluation Blood flow good Complications: No apparent complications Patient did tolerate procedure well. Chest X-ray ordered to verify placement.  CXR: not needed.  Troy Young ACNP Troy Young PCCM Pager [phone] till 3 pm If no answer page [phone] May 03, 2016, 7:35 AM

## 2016-05-08 NOTE — Progress Notes (Addendum)
eLink Physician-Brief Progress Note Patient Name: Troy Young DOB: 1950-11-29 MRN: [id number]   Date of Service  04-17-16  HPI/Events of Note  Levo at 78 , called by RN Appears as sepsis May ned hD pulled  eICU Interventions  Replace aline Get abg, pending latcic acid' Bolus Add vaso Increase klevo No role neo Stress steroids after cortisol Await bmet sent     Intervention Category Major Interventions: Sepsis - evaluation and management  Delquan Poucher J. 04/17/2016, 4:17 AM

## 2016-05-08 NOTE — Progress Notes (Signed)
eLink Physician-Brief Progress Note Patient Name: Troy Young DOB: 1951-07-10 MRN: [id number]   Date of Service  April 17, 2016  HPI/Events of Note  ECG with concerns changes, unsure if driven by sepsis or primary event  Calling cards Continued asa Epi addition trop echo  eICU Interventions       Intervention Category Major Interventions: Hypotension - evaluation and management  FEINSTEIN,DANIEL J. April 17, 2016, 5:49 AM

## 2016-05-08 NOTE — Significant Event (Signed)
Called to bedside to place new HD catheter. Right femoral trialysis HD cathter placed without problem. 0743 he developed PEA arrest. Pulseless confirm with ultrasound.   Family at bedside and aware of arrest. He was on max support and no further meaningful  Interventions are available.  Family at bedside grieving. Chaplain paged.   Richardson Landry Makinze Jani ACNP Maryanna Shape PCCM Pager [phone] till 3 pm If no answer page [phone] 04/22/2016, 8:03 AM

## 2016-05-08 NOTE — Progress Notes (Addendum)
eLink Physician-Brief Progress Note Patient Name: Troy Young DOB: 06-Jul-1951 MRN: [id number]   Date of Service  04-23-2016  HPI/Events of Note  cvvhd to start Bicarb started abg reviewed decliing  Poor prognosis Wife called by RN Add empiric antifungals Get ldh May need Ct abdo Await also repeat pcxr Repeating bolus  eICU Interventions       Intervention Category Major Interventions: Hypotension - evaluation and management  Emalene Welte J. 04/23/16, 5:31 AM

## 2016-05-08 NOTE — Progress Notes (Signed)
Pt returned to 2C15 from HD. Per HD RN, pt tolerated HD treatment until the end where he became lethargic. CBG 62 and dextrose given. Increased RR's. Upon arrival to room, pt lethargic but opens eyes to sternal rub. RR 47-52, labored and using accessory muscles. Unable to pick up oxygen saturation. [place] 2L increased to 5L. CBG 59. Half amp of dextrose given. Rapid Response RN at bedside and MD notified of patient's status. Stat orders received. PCCM MD at bedside. Pt requiring intubation and transfer. Family at bedside and notified by MD.   M.Tivon Lemoine, RN

## 2016-05-08 NOTE — Progress Notes (Addendum)
It the room with Richardson Landry Minor, NP, CRRT had just begun, BP's 58/47 per Aline with good waveform.  Bp's began to decrease rapidly despite multiple pressor use.  Pt quickly became pulsless by doppler, additional amp of epi given at 0742, without noticeable improvement.  Pt partial code at that time, no advancement of care. Pt remained pulseless per doppler, with PEA on ECG, Pt  IV medications turned off, ventilator turned off per Steve's order.  Family at the bedside, chaplain paged.    Carol Ada, RN   Jeanette Caprice, ME on call notified of the case, no further measures needed as far as the ME is concerned given the circumstances.  Carol Ada, RN

## 2016-05-08 NOTE — Progress Notes (Signed)
Case reviewed with Dr. Titus Mould  64 y/o patient with advanced DM2 and now ESRD admitted with aspiration PNA. Now with profound septic shock and severe lactic acidosis (pH 7.0 and lactate 16.5) despite high dose norepi (100). WBC 40k.   Had troponin 6/4 on 5/29. ECG now with very mild ST elevation in V1 and AVL.   Patient not candidate for emergent cardiac cath due to profound shock and likely imminent death from septic shock.   Ramal Eckhardt,MD 6:11 AM

## 2016-05-08 NOTE — Progress Notes (Signed)
ANTICOAGULATION CONSULT NOTE - Initial Consult  Pharmacy Consult for heparin Indication: r/o ACS  Allergies  Allergen Reactions  . Pork-Derived Products Other (See Comments)    Pt does not eat pork or pork products    Patient Measurements: Height: 5' 8"  (172.7 cm) Weight: 175 lb 7.8 oz (79.6 kg) IBW/kg (Calculated) : 68.4  Vital Signs: Temp: 99.7 F (37.6 C) (06/07 0000) Temp Source: Oral (06/07 0000) BP: 100/51 mmHg (06/07 0215) Pulse Rate: 102 (06/07 0339)  Labs:  Recent Labs  04/12/16 0435  04/13/16 0503 04/13/16 2228 April 26, 2016 0400  HGB 8.5*  --  7.6*  --  7.0*  HCT 28.6*  --  24.4*  --  24.0*  PLT 213  --  212  --  PENDING  APTT 136*  --  45*  --   --   CREATININE 2.34*  < > 5.48* 4.88* 5.80*  5.81*  < > = values in this interval not displayed.  Estimated Creatinine Clearance: 12.1 mL/min (by C-G formula based on Cr of 5.8).   Medical History: Past Medical History  Diagnosis Date  . 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  Marland Kitchen DM2 (diabetes mellitus, type 2) ([place])   . Hypertension   . Hyperlipidemia   . BPH (benign prostatic hyperplasia)   . Spontaneous pneumothorax     2  . Renal insufficiency   . Anemia   . Cancer ([place]) 11/08/2013    colon cancer (malignant polyp) removed  . CHF (congestive heart failure) ([place])   . Diabetic retinopathy ([place])     legally blind since 1995.    Medications:  Prescriptions prior to admission  Medication Sig Dispense Refill Last Dose  . amLODipine (NORVASC) 10 MG tablet Take 10 mg by mouth daily.    03/08/2016 at Unknown time  . aspirin EC 81 MG tablet Take 81 mg by mouth daily.   03/17/2016 at Unknown time  . furosemide (LASIX) 40 MG tablet Take 1 tablet (40 mg total) by mouth 2 (two) times daily. 60 tablet 1 03/30/2016 at 1400  . glipiZIDE (GLUCOTROL XL) 10 MG 24 hr tablet Take 10 mg by mouth daily with breakfast.   03/17/2016 at Unknown time  . insulin glargine (LANTUS) 100 UNIT/ML injection  Inject 9-15 Units into the skin at bedtime. On sliding scale   04/03/2016 at Unknown time  . losartan (COZAAR) 100 MG tablet TAKE 1 TABLET ONE TIME DAILY   03/23/2016 at Unknown time  . metoprolol tartrate (LOPRESSOR) 25 MG tablet Take 25 mg by mouth daily.    03/16/2016 at 1400  . Multiple Vitamin (MULTIVITAMIN WITH MINERALS) TABS tablet Take 1 tablet by mouth daily.   03/24/2016 at Unknown time  . pravastatin (PRAVACHOL) 40 MG tablet Take 20 mg by mouth every evening.    04/03/2016 at Unknown time  . sitaGLIPtin (JANUVIA) 50 MG tablet Take 50 mg by mouth daily.   03/27/2016 at Unknown time  . tamsulosin (FLOMAX) 0.4 MG CAPS capsule Take 0.8 mg by mouth daily after breakfast.    04/06/2016 at Unknown time  . iron polysaccharides (NU-IRON) 150 MG capsule TAKE 1 CAPSULE EVERY DAY   on hold   Scheduled:  . anidulafungin  200 mg Intravenous Once   Followed by  . [START ON 04/15/2016] anidulafungin  100 mg Intravenous Q24H  . antiseptic oral rinse  7 mL Mouth Rinse q12n4p  . antiseptic oral rinse  7 mL Mouth Rinse QID  . aspirin  81 mg Oral Daily  . atorvastatin  80 mg Oral q1800  . chlorhexidine  15 mL Mouth Rinse BID  . chlorhexidine gluconate (SAGE KIT)  15 mL Mouth Rinse BID  . Chlorhexidine Gluconate Cloth  6 each Topical Q0600  . darbepoetin (ARANESP) injection - DIALYSIS  200 mcg Intravenous Q Wed-HD  . dextrose      . famotidine (PEPCID) IV  20 mg Intravenous Q24H  . feeding supplement (NEPRO CARB STEADY)  237 mL Oral BID BM  . feeding supplement (PRO-STAT SUGAR FREE 64)  30 mL Per Tube BID  . feeding supplement (VITAL HIGH PROTEIN)  1,000 mL Per Tube Q24H  . ferumoxytol  510 mg Intravenous Weekly  . hydrocortisone sodium succinate  50 mg Intravenous Q6H  . insulin aspart  0-15 Units Subcutaneous Q4H  . meropenem (MERREM) IV  1 g Intravenous Q12H  . mupirocin ointment  1 application Nasal BID  . sodium bicarbonate      . sodium bicarbonate      . sodium bicarbonate      . sodium chloride   500 mL Intravenous Once  . sodium chloride flush  10-40 mL Intracatheter Q12H  . sodium chloride flush  3 mL Intravenous Q12H  . vancomycin  750 mg Intravenous Q24H   Infusions:  . dextrose 40 mL/hr at 04/13/16 2211  . epinephrine 25 mcg/min (2016/04/16 0615)  . fentaNYL infusion INTRAVENOUS    . norepinephrine (LEVOPHED) Adult infusion 120 mcg/min (2016-04-16 0545)  . phenylephrine (NEO-SYNEPHRINE) Adult infusion 300 mcg/min (Apr 16, 2016 0615)  . dialysis replacement fluid (prismasate)    . dialysate (PRISMASATE)    .  sodium bicarbonate  infusion 1000 mL 150 mL/hr at 04-16-2016 0609  . sodium bicarbonate (isotonic) 1000 mL infusion    . vasopressin (PITRESSIN) infusion - *FOR SHOCK* 0.03 Units/min (04-16-2016 0447)    Assessment: 65yo male decompensated overnight, starting CRRT, concern for ACS, to begin heparin.  Goal of Therapy:  Heparin level 0.3-0.7 units/ml   Plan:  Will begin heparin gtt (without bolus per MD request) at 1000 units/hr and monitor heparin levels and CBC.  Wynona Neat, PharmD, BCPS  April 16, 2016,6:53 AM

## 2016-05-08 NOTE — Procedures (Signed)
Central Venous Catheter Insertion Procedure Note [place] Samp [id number] [birth year]  Procedure: Insertion of Central Venous Catheter Indications: Assessment of intravascular volume, Drug and/or fluid administration and Frequent blood sampling  Procedure Details Consent: Unable to obtain consent because of emergent medical necessity. Time Out: Verified patient identification, verified procedure, site/side was marked, verified correct patient position, special equipment/implants available, medications/allergies/relevent history reviewed, required imaging and test results available.  Performed  Maximum sterile technique was used including antiseptics, cap, gloves, gown, hand hygiene, mask and sheet. Skin prep: Chlorhexidine; local anesthetic administered A antimicrobial bonded/coated triple lumen catheter was placed in the left internal jugular vein using the Seldinger technique.  Ultrasound was used to verify the patency of the vein and for real time needle guidance.  Evaluation Blood flow good Complications: No apparent complications Patient did tolerate procedure well. Chest X-ray ordered to verify placement.  CXR: pending.  [name] 05/06/2016, 6:32 AM

## 2016-05-08 NOTE — Procedures (Signed)
Arterial Catheter Insertion Procedure Note Troy Young [id number] 08/18/1951  Procedure: Insertion of Arterial Catheter  Indications: Blood pressure monitoring and Frequent blood sampling  Procedure Details Consent: Unable to obtain consent because of emergent medical necessity. Time Out: Verified patient identification, verified procedure, site/side was marked, verified correct patient position, special equipment/implants available, medications/allergies/relevent history reviewed, required imaging and test results available.  Performed  Maximum sterile technique was used including antiseptics, cap, gloves, gown, hand hygiene, mask and sheet. Skin prep: Chlorhexidine; local anesthetic administered 20 gauge catheter was inserted into right radial artery using the Seldinger technique.  Evaluation Blood flow good; BP tracing good. Complications: No apparent complications.   Dulcy Fanny Apr 18, 2016

## 2016-05-08 NOTE — Discharge Summary (Signed)
[facility] critical care medicine death note  Cause of death #1 gram-positive cocci bacteremia Septic shock ST elevation MI Aspiration pneumonia  Date of death 05-01-2016  Brief hospital course and history of present illness: This is a 66 year old very pleasant gentleman who is blind who is admitted to [facility] with aspiration pneumonia. This was complicated by acute respiratory failure with hypoxemia as well as acute on chronic kidney failure. He required mechanical ventilatory support as well as continuous hemofiltration and hemodialysis. He initially improved and was discharged from the intensive care unit to the stepdown unit. On 04/13/2016 he had his first episode of intermittent hemodialysis. Not long into the session he began to develop worsening mental status, tachycardia, hypotension, and respiratory failure. His family noted that he had significant cough throughout the day prior to this procedure. He was intubated emergently on the evening of 04/13/2016 for worsening respiratory failure. Throughout the evening after his intubation he developed worsening shock and metabolic acidosis. He was treated aggressively with IV fluids, IV bicarbonate, broad-spectrum antibiotics. However, despite efforts with multiple vasopressors at maximum dose his blood pressure continued to plummet. We discussed his overall poor prognosis with his family and we changed his CODE STATUS to DO NOT RESUSCITATE. He passed with his family at the bedside due to progressive septic shock on May 01, 2016.  Roselie Awkward, MD [place] [facility] Pager: [phone] After 3pm or if no response, call [phone]

## 2016-05-08 NOTE — Progress Notes (Signed)
eLink Physician-Brief Progress Note Patient Name: Troy Young DOB: May 23, 1951 MRN: [id number]   Date of Service  2016-05-04  HPI/Events of Note  Repeating stat abg Repeat [facility] Calling renal for cvvhd Bicarb to temporize only, wont chang outcome likely Wife to be called in  Having pccm to bedside provider now to assess  eICU Interventions       Intervention Category Intermediate Interventions: Hypotension - evaluation and management  [place] Millette J. 04-May-2016, 5:19 AM

## 2016-05-08 NOTE — Progress Notes (Signed)
Pharmacy Antibiotic Note  Troy Young is a 65 y.o. male admitted on 03/21/2016, tx'd from SDU back to ICU overnight, now starting CRRT.  Pharmacy has been consulted to adjust med dosing for CRRT.  Plan: Vancomycin 750mg  IV every 24 hours.  Goal trough 15-20 mcg/mL.  Meropenem 1g IV every 12 hours.  Height: 5\' 8"  (172.7 cm) Weight: 175 lb 7.8 oz (79.6 kg) IBW/kg (Calculated) : 68.4  Temp (24hrs), Avg:100.1 F (37.8 C), Min:99.6 F (37.6 C), Max:100.8 F (38.2 C)   Recent Labs Lab 04/10/16 0420  04/11/16 0530  04/12/16 0435 04/12/16 1654 04/13/16 0503 04/13/16 2228 2016-05-05 0400  WBC 25.8*  --  32.0*  --  32.8*  --  40.1*  --  PENDING  CREATININE 1.82*  < > 2.05*  < > 2.34* 3.75* 5.48* 4.88* 5.80*  5.81*  < > = values in this interval not displayed.  Estimated Creatinine Clearance: 12.1 mL/min (by C-G formula based on Cr of 5.8).    Allergies  Allergen Reactions  . Pork-Derived Products Other (See Comments)    Pt does not eat pork or pork products    Antimicrobials this admission: Vancomycin 5/29>>6/1; 6/5>>  Zosyn 5/29>>6/1  Unasyn 6/1>6/3  Doxycycline 5/30>6/1  Cefepime 6/5   Microbiology results: 5/28 BCx - neg  5/29 UCx - multiple species  5/29 sputum cx - no organisms seen  5/29 MRSA PCR - positive  RMSF negative  6/5 CDiff: neg  6/5 BCx: staph spp.   Thank you for allowing pharmacy to be a part of this patient's care.  Wynona Neat, PharmD, BCPS  05-May-2016 5:44 AM

## 2016-05-08 NOTE — Progress Notes (Signed)
PULMONARY / CRITICAL CARE MEDICINE   Name: Troy Young MRN: [id number] DOB: Jun 25, 1951    ADMISSION DATE:  04/07/2016  CHIEF COMPLAINT:  Difficulty Breathing  SUBJECTIVE: Intubated overnight Mental status declined in dialysis, then increasing respiratory failure and shock Overnight he had worsening shock and acidosis now requiring high dose vasopressor support  VITAL SIGNS: BP 100/51 mmHg  Pulse 102  Temp(Src) 99.7 F (37.6 C) (Oral)  Resp 22  Ht 5\' 8"  (1.727 m)  Wt 79.6 kg (175 lb 7.8 oz)  BMI 26.69 kg/m2  SpO2 99%  HEMODYNAMICS:    VENTILATOR SETTINGS: Vent Mode:  [-] PRVC FiO2 (%):  [40 %] 40 % Set Rate:  [14 bmp-35 bmp] 35 bmp Vt Set:  [480 mL-550 mL] 480 mL PEEP:  [5 cmH20] 5 cmH20 Plateau Pressure:  [18 cmH20-19 cmH20] 19 cmH20  INTAKE / OUTPUT: I/O last 3 completed shifts: In: 950 [P.O.:340; NG/GT:60; IV Piggyback:550] Out: 304 [Urine:140; Other:164]  PHYSICAL EXAMINATION: General: sedated on vent Neuro: sedated on vent HEENT: NCAT, PERRL,ETT in place Cardiac: RRR, no mgr Chest: few rhonchi bilaterally, vent supported breaths Abd: BS infrequent but present, mildly distended but no guarding or rebound Ext: trace edema Skin: no rashes  LABS:  BMET  Recent Labs Lab 04/13/16 0503 04/13/16 2228 04-20-16 0400  NA 141 143 141  141  K 4.0 5.0 5.1  5.1  CL 104 103 97*  99*  CO2 22 15* 11*  10*  BUN 73* 56* 54*  54*  CREATININE 5.48* 4.88* 5.80*  5.81*  GLUCOSE 156* 112* 152*  151*   Electrolytes  Recent Labs Lab 04/13/16 0503 04/13/16 2228 April 20, 2016 0400  CALCIUM 9.0 8.5* 8.6*  8.6*  MG 3.2* 3.1* 3.3*  PHOS 3.9 5.7* 12.6*  12.6*    CBC  Recent Labs Lab 04/12/16 0435 04/13/16 0503 04/20/16 0400  WBC 32.8* 40.1* PENDING  HGB 8.5* 7.6* 7.0*  HCT 28.6* 24.4* 24.0*  PLT 213 212 PENDING   Coag's  Recent Labs Lab 04/11/16 0530 04/12/16 0435 04/13/16 0503  APTT 83* 136* 45*    Sepsis Markers  Recent Labs Lab  04/20/16 0400 04/20/2016 0448  LATICACIDVEN  --  16.5*  PROCALCITON 14.80  --    ABG  Recent Labs Lab 04/13/16 2348 04-20-16 0417 2016-04-20 0523  PHART 7.302* 7.122* 7.093*  PCO2ART 41.7 27.3* 24.9*  PO2ART 394.0* 128* 165.0*   Liver Enzymes  Recent Labs Lab 04/12/16 1654 04/13/16 0503 2016/04/20 0400  ALBUMIN 2.4* 2.4* 2.3*   Cardiac Enzymes No results for input(s): TROPONINI, PROBNP in the last 168 hours.  Glucose  Recent Labs Lab 04/13/16 2049 04/13/16 2115 04/13/16 2135 04/13/16 2218 04-20-16 0041 20-Apr-2016 0350  GLUCAP 62* 59* 99 74 196* 155*   Imaging CXR this morning, > ETT in place, no clear infiltrate, normal cardiac silhouette  STUDIES:  5/29 Echo >> EF 40 to 45%, PAS 58 mmHg, mod MR, mod TR 12 lead 6/7 > ST elevation V1-V2 with reciprocal changes inferior limb and posterior chest leads  CULTURES: Blood 5/29>> (-) Urine 5/29>> (-) Sputum 5/29>> (-) C diff 6/5>>>Negative Blood 6/5>>>GPC bacteremia 4/4 bottles  ANTIBIOTICS: Ceftriaxone x1 dose 5/29 >> Vanc 5/29 >> 6/1>>>6/5>>> Pip-tazo 5/29 >> 6/1 Unasyn 6/1 >> 6/3 Cefepime 6/5>>> 6/7 Meropenem 6/7 >  Anidulofungin 6/7 >   SIGNIFICANT EVENTS: 5/28 Bolused 3L in ED 5/29 intubated 5/30 worsening renal failure, on pressors, hypekalemia  LINES/TUBES: 5/29 ETT>> 6/4 5/30 rt rad a line>>6/6 5/30 rt i j HD cath>>  5/30 L TLC >>6/6  6/6 ETT >  6/7 L IJ CVL >   DISCUSSION: 65 yo male with progressive dyspnea, edema from acute on chronic systolic CHF and aspiration pneumonia.  Extubated 6/4, but then developed worsening septic shock with lactic acidosis on 6/6 of uncertain etiology.  He has what appears to be a concomitant STEMI contributing further to his shock.  His prognosis is dismal at this point.    ASSESSMENT / PLAN:  PULMONARY A: Acute Hypoxemic Resp Failure P:   Full vent support ABG later today Max RR on vent No SBT  CARDIOVASCULAR A:  Chronic ischemic systolic  cardiomyopathy Septic shock 6/7 AM (source uncertain) STEMI in setting of Septic shock H/O HTN Severe lactic acidosis due to shock, ? Possibly dead bowel but too unstable for imaging P:  Continue ASA Add heparin gtt Echo this morning Cardiology has reviewed case they feel due to his sepsis that a left heart cath would be too dangerous and unlikely to be helpful Continue max dose pressors (epinephrin gtt, levophed, neo, vasopressin)  RENAL A:   Acute on chronic kidney injury CKD 4 (baseline) Hyperkalemia P:   Restart CVVHD Per renal  GASTROINTESTINAL A:   Nutrition P:   Tube feedings on hold for now PPI  HEMATOLOGIC A:   Anemia, bleeding, hematuria P: Monitor carefully for bleeding on heparin SCDs  INFECTIOUS A:   Asp pna >> completed Abx 6/03 Fever and increased WBC 6/5 > GPC bacteremia P:   Blood cultures on 6/5 Vanc/cefepime restarted 6/5 F/u on cultures Remove HD cath now  ENDOCRINE A:   DM2 P:   Continue with SSI   NEUROLOGIC A:   Acute metabolic encephalopathy  Blind P:   RASS -1 Fentanyl gtt  I discussed the situation with his family and explained that with his multi-organ failure he is unlikely to survive this illnesses.  We decided to continue full vent support with pressors and HD if able but not perform CPR as this would only cause more harm than good (broken ribs, etc).  My cc time 50 minutes outside of procedures  Roselie Awkward, MD [place] [facility] Pager: [phone] Cell: [phone] After 3pm or if no response, call [phone]

## 2016-05-08 NOTE — Progress Notes (Signed)
CRITICAL VALUE ALERT  Critical value received:  Lactic 16.5  Date of notification:  May 12, 2016  Time of notification:  0555  Critical value read back:Yes.    Nurse who received alert:  Lianne Bushy RN  MD notified (1st page):  [place]  Time of first page:  0555  Responding MD:  [place]  Time MD responded:  Filomena.Smalls  MD notified at bedside.

## 2016-05-08 NOTE — Progress Notes (Signed)
Family notified of patient's rapid decline and urged to come to hospital. MD at the bedside. Family on their way.

## 2016-05-08 NOTE — Progress Notes (Signed)
At termination of tx pt unresponsive to verbal and physical stimulus , 2105 blood sugar checked  Result 62, 2110 rapid response at bedside , 1/2 amp d 50 given per dr Delia Chimes , pt slightly more responsive, at approx 2120 pt transferred back to 2c 15 with rapid response nurse, on arrival to 2c 15 blood sugar rechecked result 59 1/2 amp d 50 given, report given to [place] rn in 2c.

## 2016-05-08 DEATH — deceased

## 2016-08-06 IMAGING — CR DG CHEST 1V PORT
1 series · 1 of 1 positions shown · non-contrast
Comparison: Chest radiograph performed 04/04/2016

CLINICAL DATA: Endotracheal tube placement.  Initial encounter.

EXAM:
PORTABLE CHEST 1 VIEW

[AP]
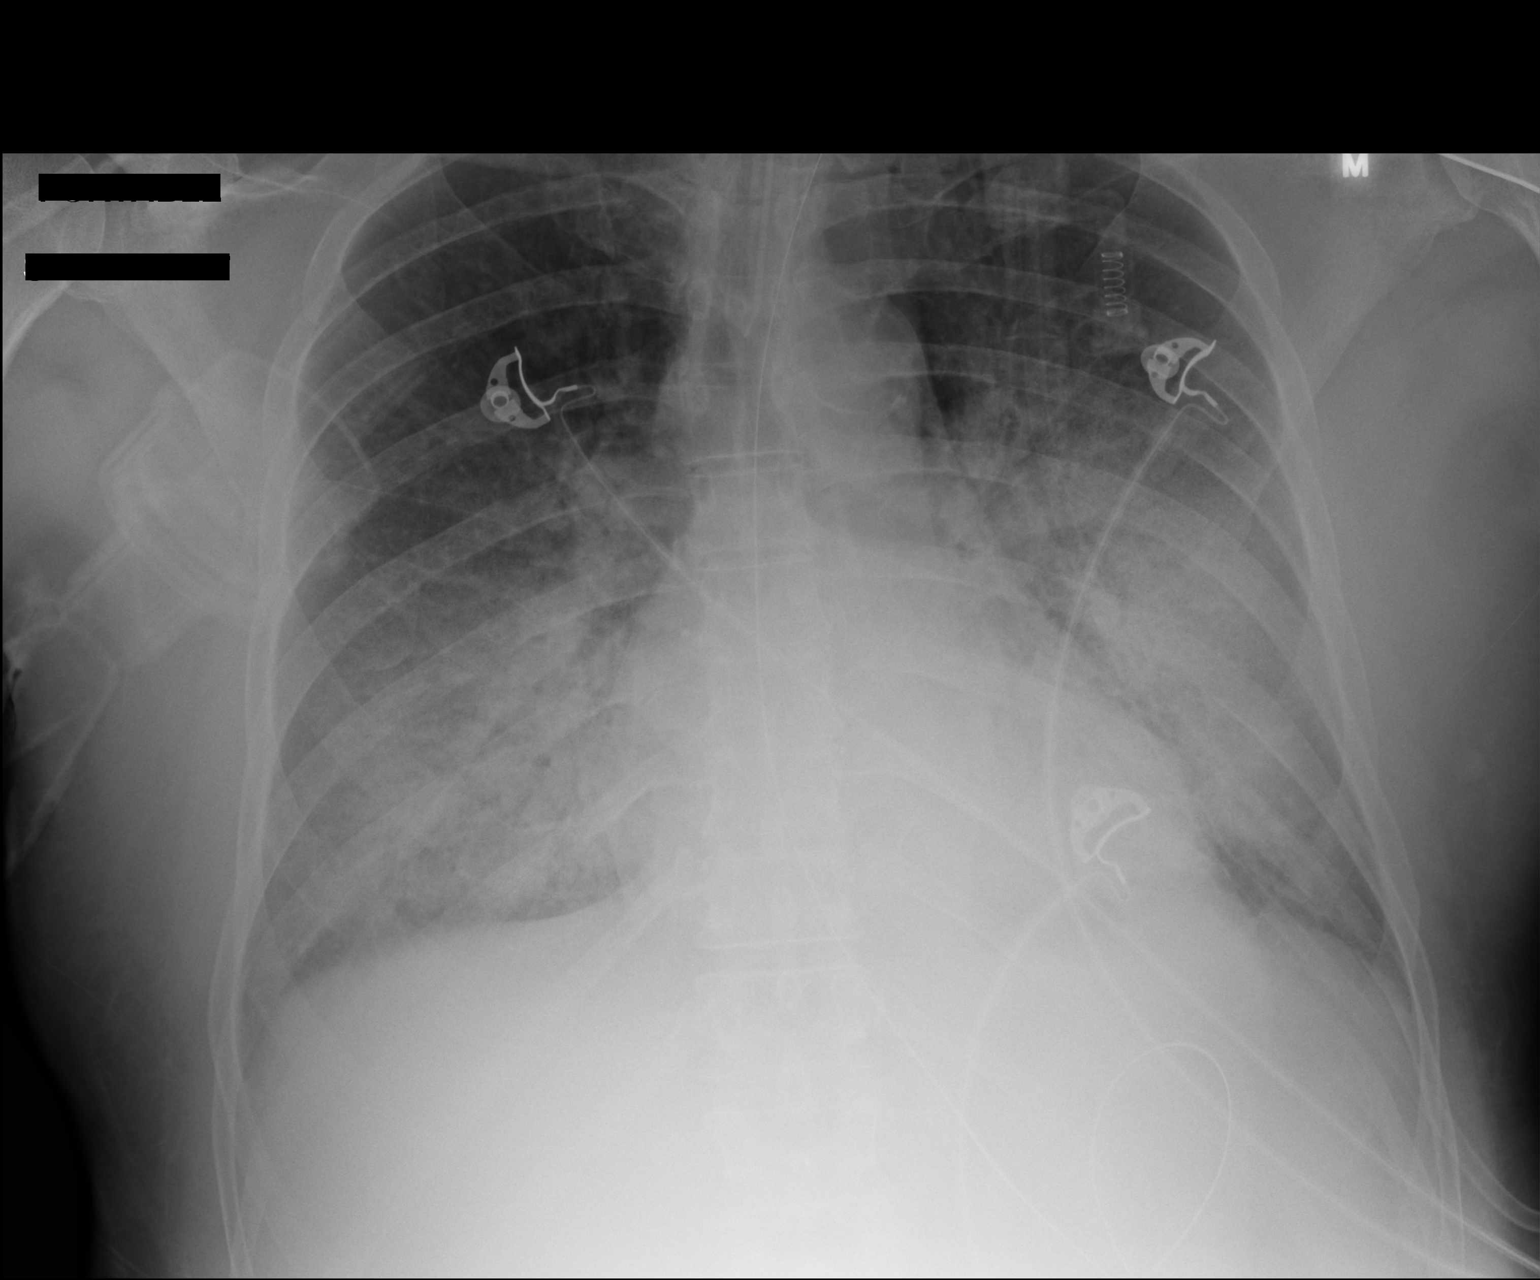

[1 of 1 positions shown; findings below may reference images not displayed]

FINDINGS: The patient's endotracheal tube is seen ending 3-4 cm above the
carina. An enteric tube is noted extending below the diaphragm.

Worsening bilateral lower lung zone airspace opacification raises
concern for significantly worsening multifocal pneumonia, though
pulmonary edema might have a similar appearance. No definite pleural
effusion or pneumothorax is seen.

The cardiomediastinal silhouette is normal in size. No acute osseous
abnormalities are identified.
IMPRESSION: 1. Endotracheal tube seen ending 3-4 cm above the carina.
2. Worsening bilateral lower lung zone airspace opacification is
concerning for significantly worsened multifocal pneumonia, though
pulmonary edema might have a similar appearance.

## 2016-08-11 IMAGING — CR DG CHEST 1V PORT
1 series · 1 of 1 positions shown · non-contrast
Comparison: 04/09/2016

CLINICAL DATA: Respiratory failure

EXAM:
PORTABLE CHEST 1 VIEW

[AP]
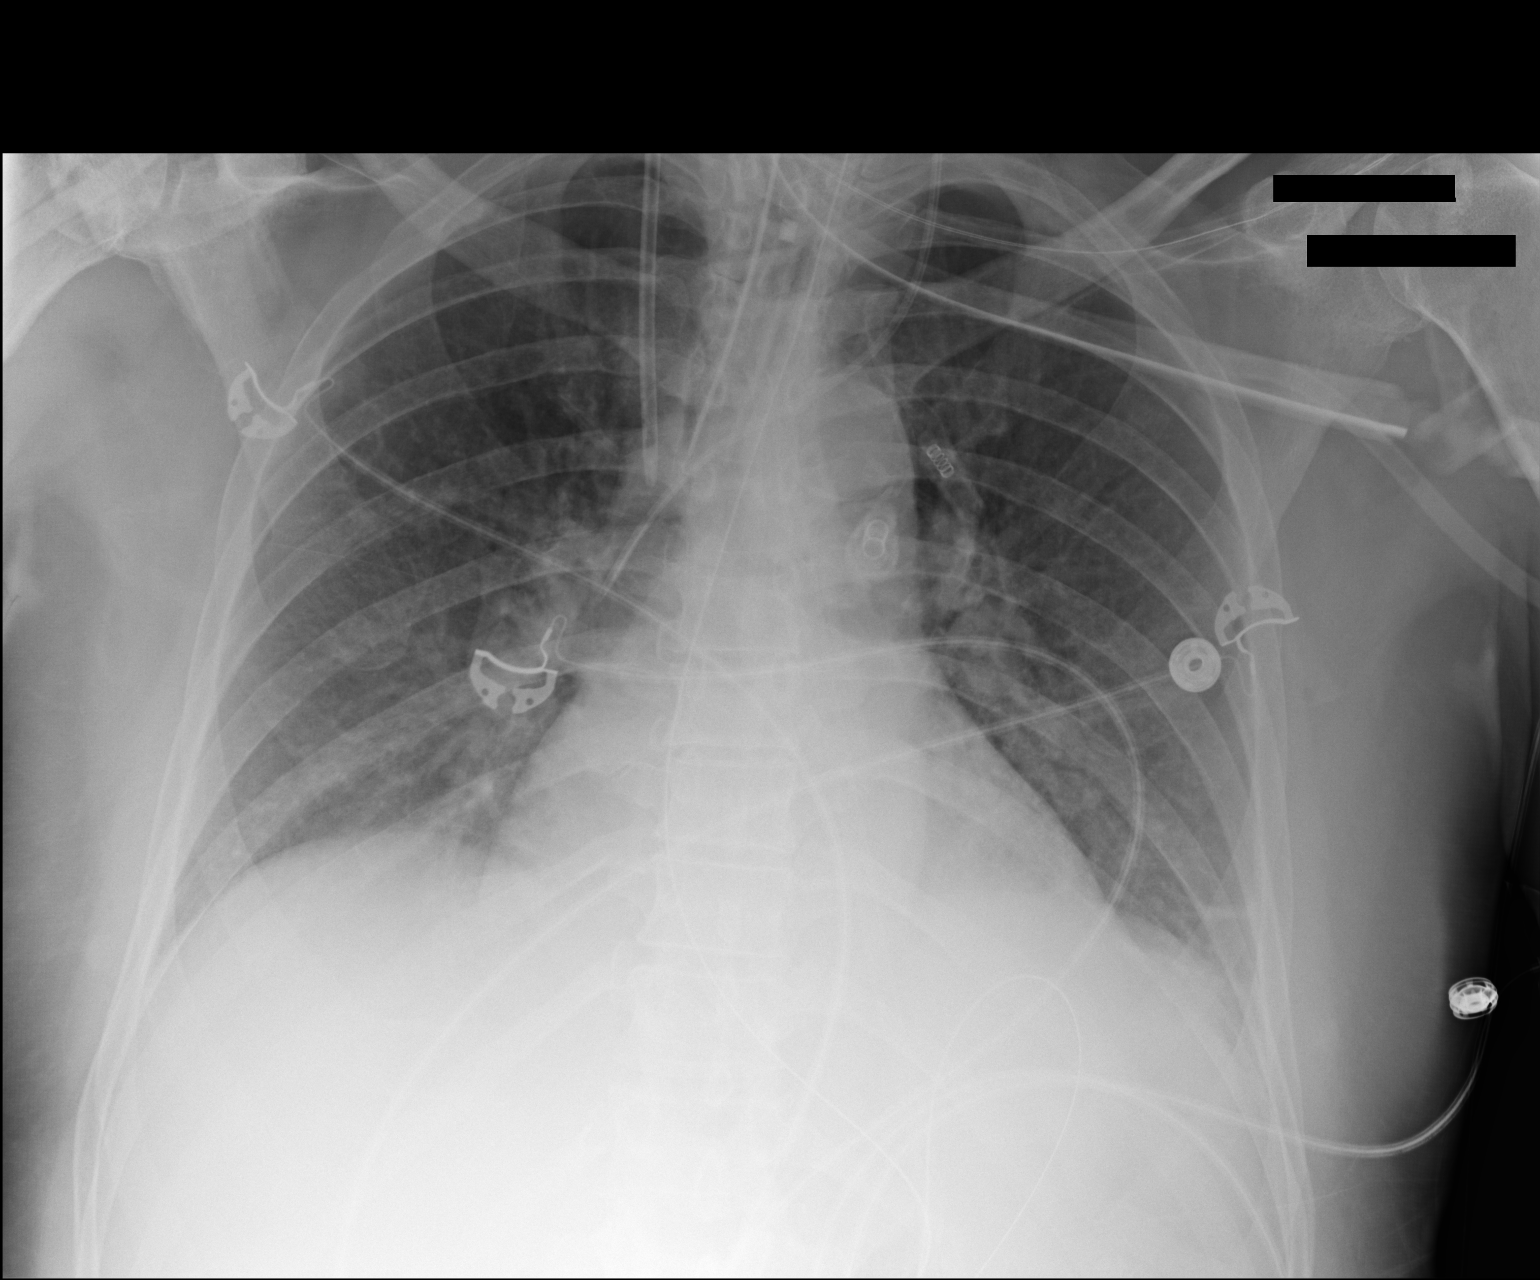

[1 of 1 positions shown; findings below may reference images not displayed]

FINDINGS: Stable endotracheal tube with tip nearly 2 cm above the carina.
Bilateral IJ central line with tips at the SVC. An orogastric tube
reaches the stomach at least.

Improved basilar in aeration. Lung volumes remain low. No
pneumothorax. Normal heart size.
IMPRESSION: 1. Unchanged positioning of tubes and lines. The endotracheal tube
tip is 
2 cm above the carina.
2. Improved basilar aeration.

## 2016-08-29 IMAGING — US US RENAL
1 series · 14 of 25 positions shown · non-contrast
Comparison: None.

CLINICAL DATA: Acute onset of renal failure.  Initial encounter.

EXAM:
RENAL / URINARY TRACT ULTRASOUND COMPLETE

[Series 1: us renal · 0.24mm/px · 14 of 41 slices shown]
[im 1/41]
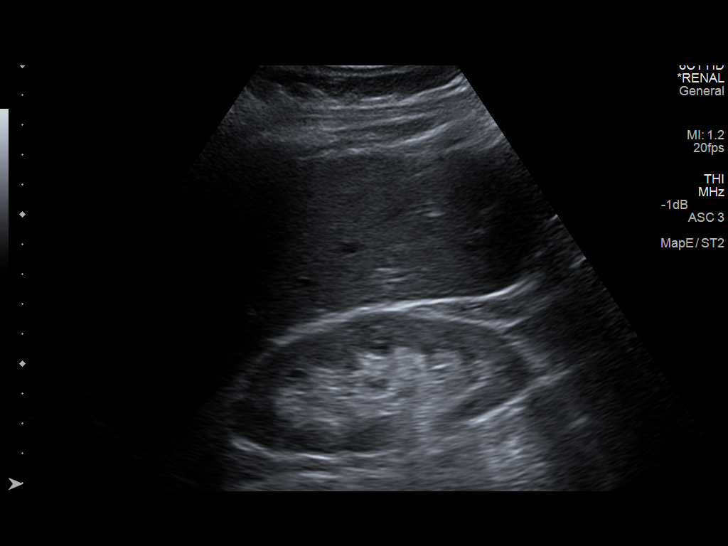
[im 4/41]
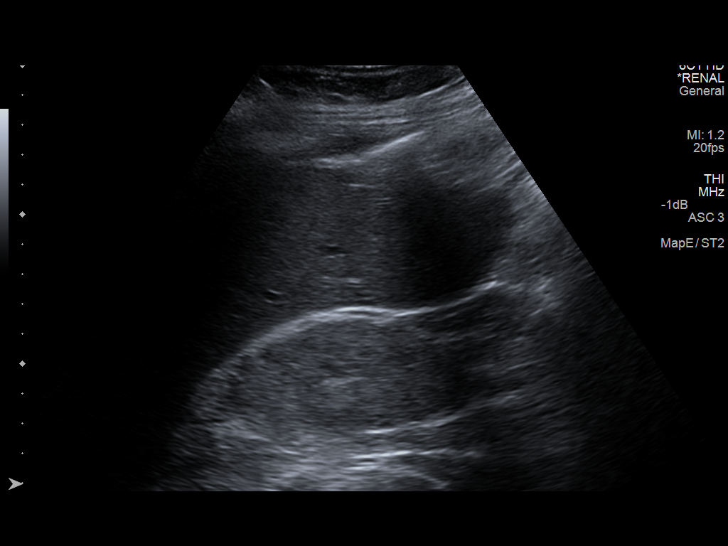
[im 7/41]
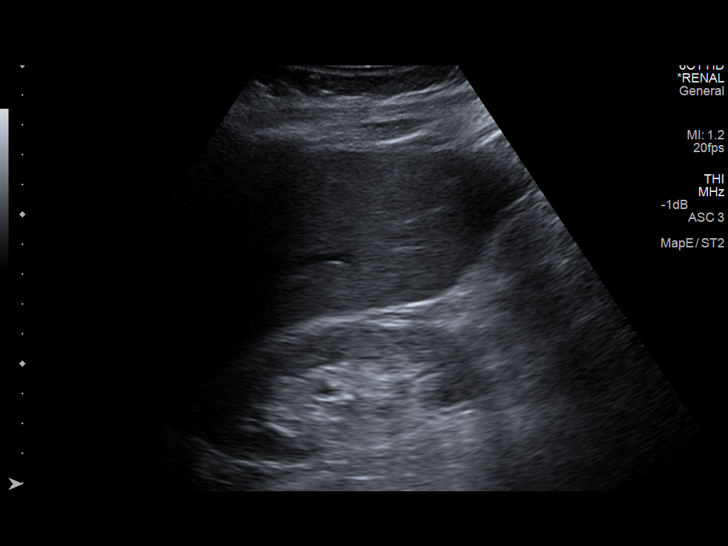
[im 11/41]
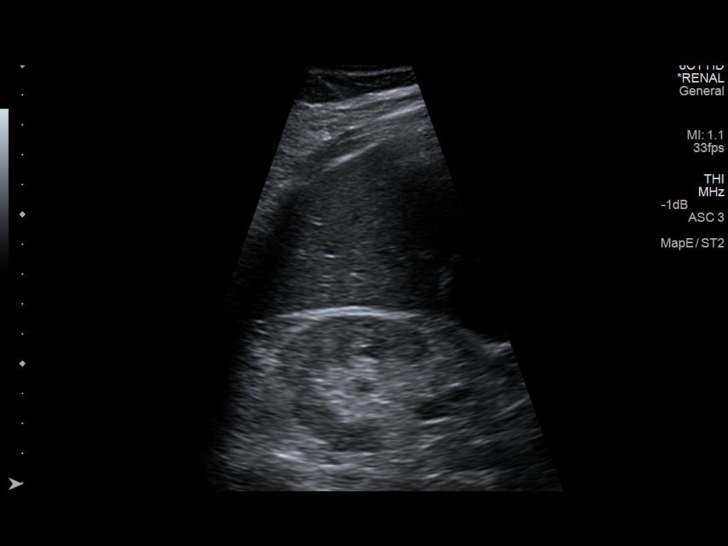
[im 14/41]
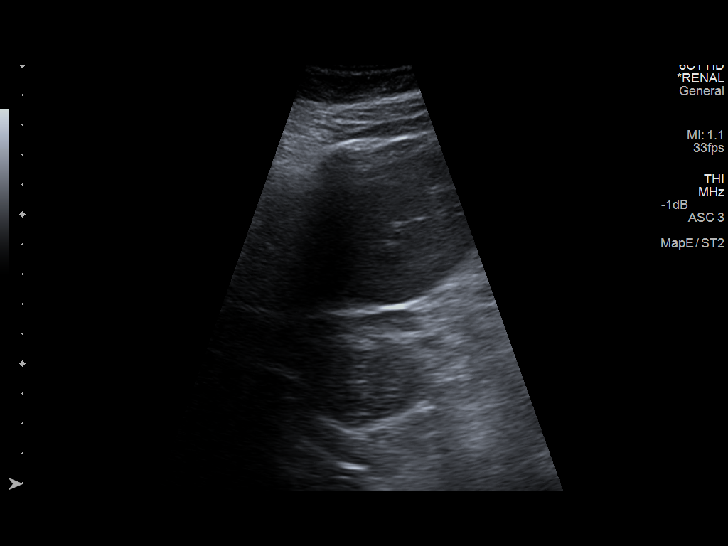
[im 16/41]
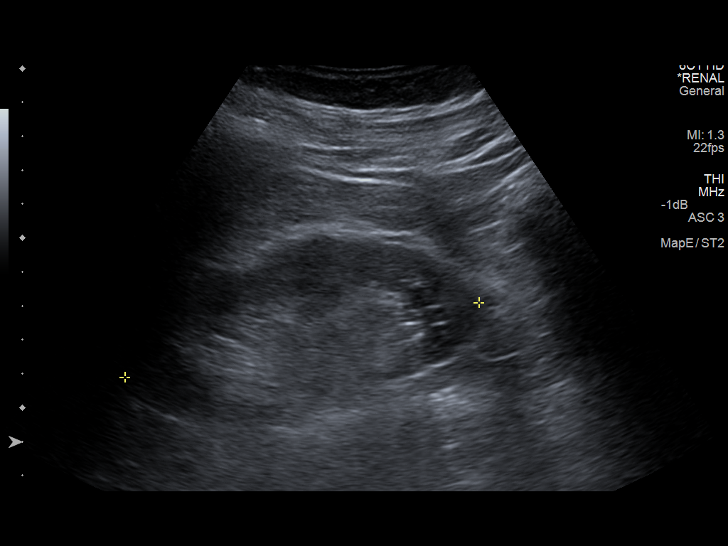
[im 19/41]
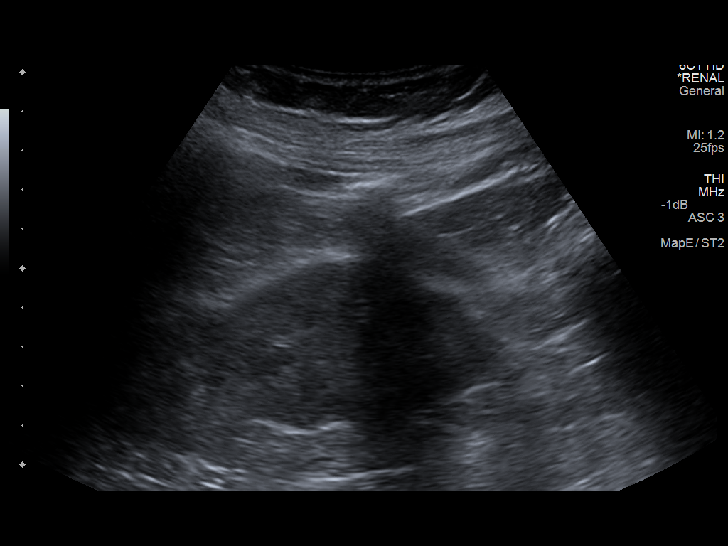
[im 22/41]
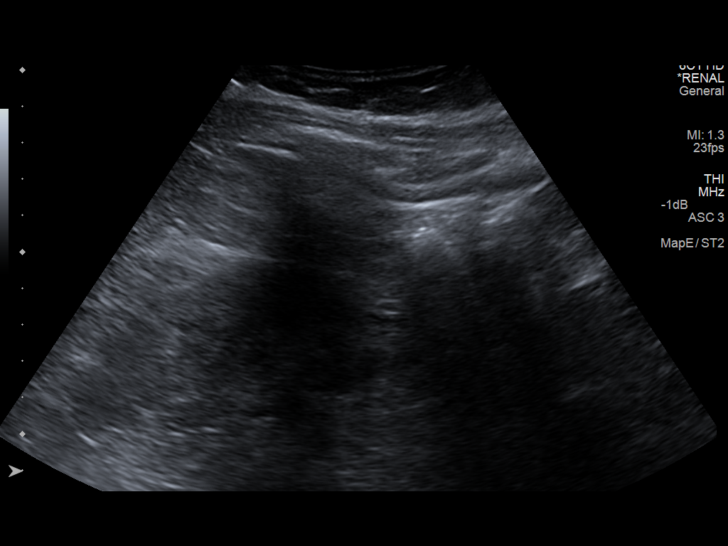
[im 26/41]
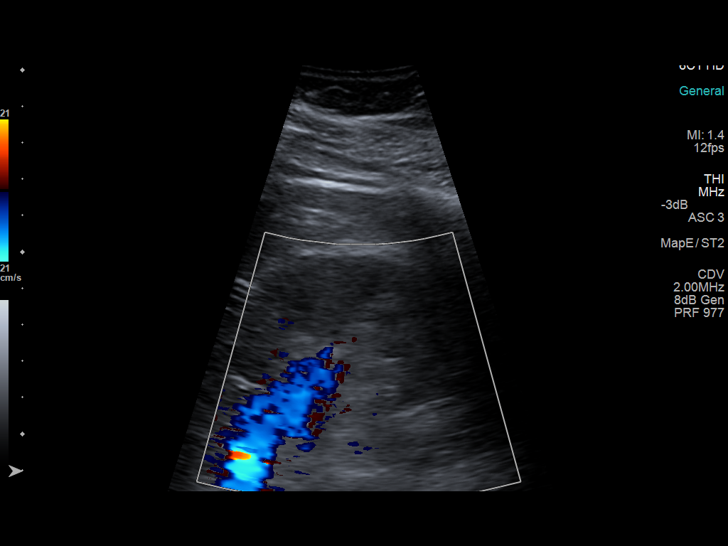
[im 27/41]
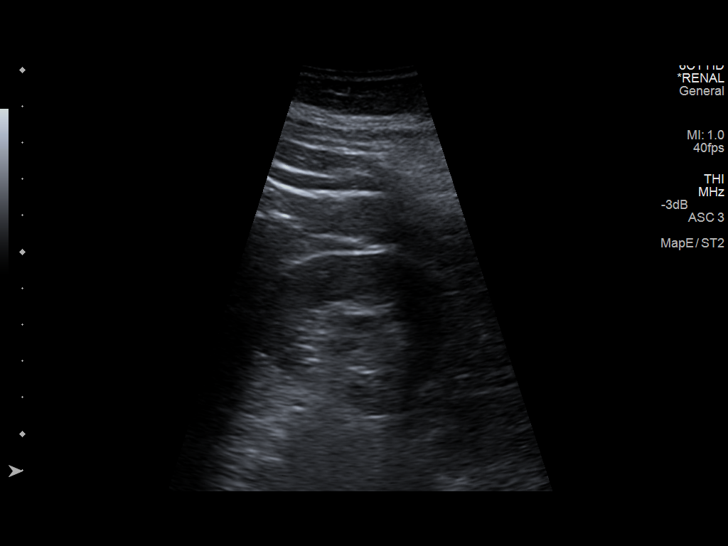
[im 31/41]
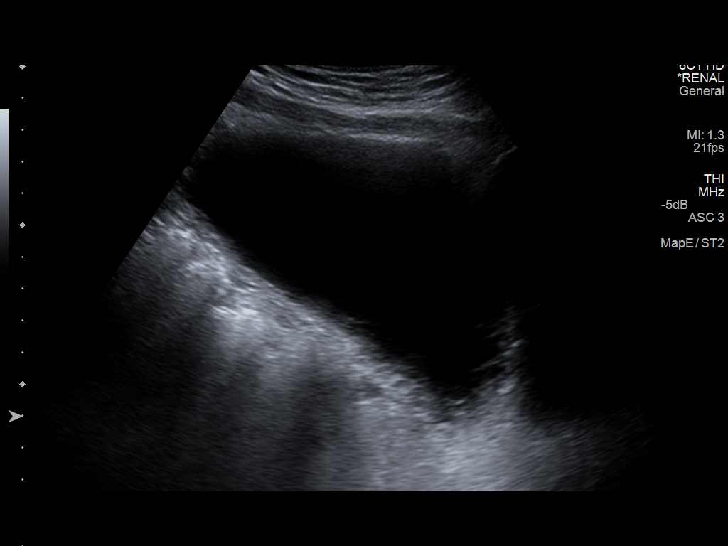
[im 34/41]
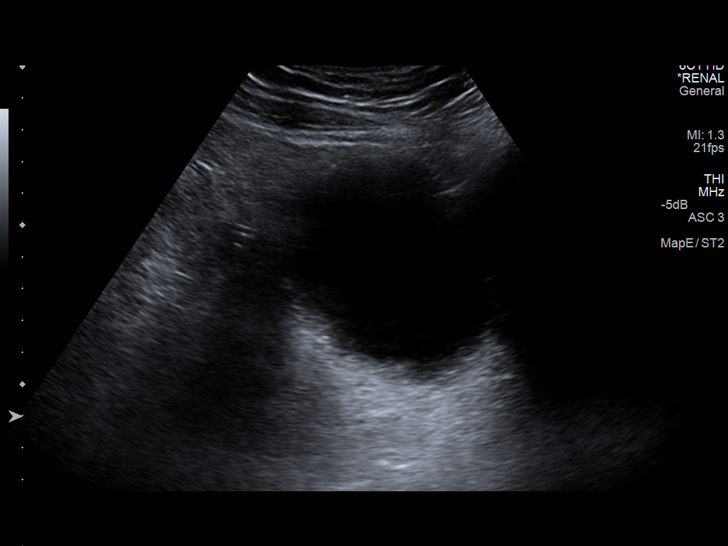
[im 37/41]
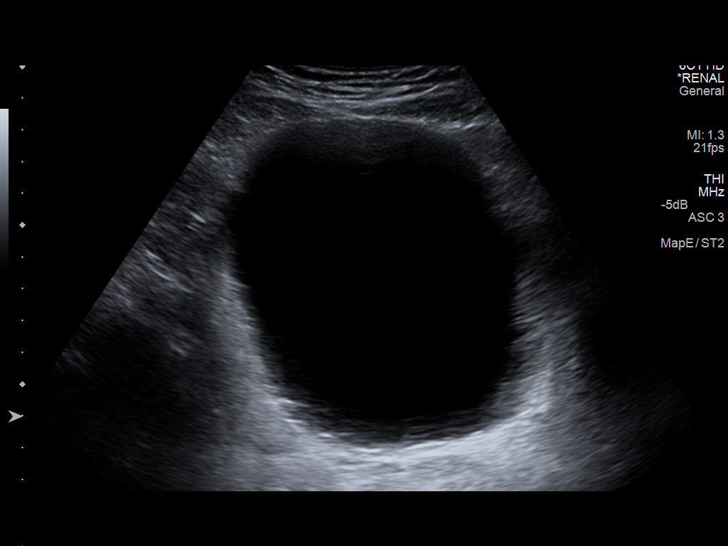
[im 41/41]
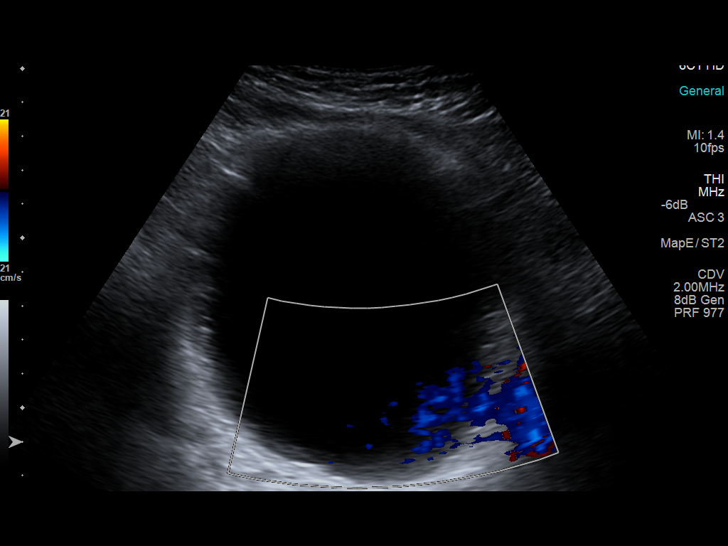

[14 of 25 positions shown; findings below may reference images not displayed]

FINDINGS: Right Kidney:

Length: 10.2 cm. Echogenicity within normal limits. No mass or
hydronephrosis visualized.

Left Kidney:

Length: 10.7 cm. Echogenicity within normal limits. No mass or
hydronephrosis visualized.

Bladder:

Appears normal for degree of bladder distention. Bilateral ureteral
jets are visualized. There appears to be impression on the base of
the bladder from the prostate.
IMPRESSION: 1. Unremarkable renal ultrasound.
2. Apparent impression of the prostate on the base of the bladder.
Would correlate with PSA.
